# Patient Record
Sex: Female | Born: 1990 | Race: White | Hispanic: No | Marital: Married | Smoking: Current every day smoker
Health system: Southern US, Community
[De-identification: ages and names within clinical notes are randomized; demographics above are authoritative.]

## PROBLEM LIST (undated history)

## (undated) DIAGNOSIS — R102 Pelvic and perineal pain unspecified side: Secondary | ICD-10-CM

## (undated) DIAGNOSIS — F909 Attention-deficit hyperactivity disorder, unspecified type: Secondary | ICD-10-CM

## (undated) DIAGNOSIS — I10 Essential (primary) hypertension: Secondary | ICD-10-CM

## (undated) DIAGNOSIS — K219 Gastro-esophageal reflux disease without esophagitis: Secondary | ICD-10-CM

## (undated) DIAGNOSIS — F329 Major depressive disorder, single episode, unspecified: Secondary | ICD-10-CM

## (undated) DIAGNOSIS — G43009 Migraine without aura, not intractable, without status migrainosus: Secondary | ICD-10-CM

## (undated) DIAGNOSIS — K589 Irritable bowel syndrome without diarrhea: Secondary | ICD-10-CM

## (undated) DIAGNOSIS — M1990 Unspecified osteoarthritis, unspecified site: Secondary | ICD-10-CM

## (undated) DIAGNOSIS — D509 Iron deficiency anemia, unspecified: Secondary | ICD-10-CM

## (undated) DIAGNOSIS — F32A Depression, unspecified: Secondary | ICD-10-CM

## (undated) DIAGNOSIS — E7800 Pure hypercholesterolemia, unspecified: Secondary | ICD-10-CM

## (undated) DIAGNOSIS — N938 Other specified abnormal uterine and vaginal bleeding: Secondary | ICD-10-CM

## (undated) DIAGNOSIS — J45909 Unspecified asthma, uncomplicated: Secondary | ICD-10-CM

## (undated) DIAGNOSIS — R87612 Low grade squamous intraepithelial lesion on cytologic smear of cervix (LGSIL): Secondary | ICD-10-CM

## (undated) DIAGNOSIS — T7840XA Allergy, unspecified, initial encounter: Secondary | ICD-10-CM

## (undated) DIAGNOSIS — F419 Anxiety disorder, unspecified: Secondary | ICD-10-CM

## (undated) HISTORY — PX: WISDOM TOOTH EXTRACTION: SHX21

## (undated) HISTORY — DX: Other specified abnormal uterine and vaginal bleeding: N93.8

## (undated) HISTORY — DX: Pelvic and perineal pain: R10.2

## (undated) HISTORY — DX: Iron deficiency anemia, unspecified: D50.9

## (undated) HISTORY — DX: Allergy, unspecified, initial encounter: T78.40XA

## (undated) HISTORY — DX: Gastro-esophageal reflux disease without esophagitis: K21.9

## (undated) HISTORY — DX: Unspecified osteoarthritis, unspecified site: M19.90

## (undated) HISTORY — DX: Irritable bowel syndrome, unspecified: K58.9

## (undated) HISTORY — DX: Low grade squamous intraepithelial lesion on cytologic smear of cervix (LGSIL): R87.612

## (undated) HISTORY — DX: Pelvic and perineal pain unspecified side: R10.20

## (undated) HISTORY — DX: Migraine without aura, not intractable, without status migrainosus: G43.009

## (undated) HISTORY — PX: OTHER SURGICAL HISTORY: SHX169

---
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
## 2012-11-18 ENCOUNTER — Emergency Department: Payer: Self-pay | Admitting: Emergency Medicine

## 2012-11-19 LAB — CBC WITH DIFFERENTIAL/PLATELET
Basophil #: 0.1 10*3/uL (ref 0.0–0.1)
Basophil %: 0.6 %
Eosinophil #: 0.1 10*3/uL (ref 0.0–0.7)
HCT: 38.8 % (ref 35.0–47.0)
MCHC: 33.1 g/dL (ref 32.0–36.0)
MCV: 82 fL (ref 80–100)
Monocyte #: 0.7 x10 3/mm (ref 0.2–0.9)
Monocyte %: 7.2 %
Platelet: 277 10*3/uL (ref 150–440)
RDW: 20.5 % — ABNORMAL HIGH (ref 11.5–14.5)
WBC: 9.5 10*3/uL (ref 3.6–11.0)

## 2012-11-19 LAB — MONONUCLEOSIS SCREEN: Mono Test: NEGATIVE

## 2012-11-22 ENCOUNTER — Ambulatory Visit: Payer: Self-pay | Admitting: Physician Assistant

## 2013-03-08 ENCOUNTER — Ambulatory Visit: Payer: Self-pay | Admitting: Family Medicine

## 2013-03-08 LAB — RAPID STREP-A WITH REFLX: Micro Text Report: NEGATIVE

## 2013-03-10 LAB — BETA STREP CULTURE(ARMC)

## 2014-11-03 LAB — HM PAP SMEAR: HM Pap smear: NEGATIVE

## 2015-11-29 ENCOUNTER — Emergency Department
Admission: EM | Admit: 2015-11-29 | Discharge: 2015-11-29 | Disposition: A | Discharge status: Home / Self Care | Payer: BLUE CROSS/BLUE SHIELD | Attending: Emergency Medicine | Admitting: Emergency Medicine

## 2015-11-29 ENCOUNTER — Encounter: Payer: Self-pay | Admitting: *Deleted

## 2015-11-29 DIAGNOSIS — F909 Attention-deficit hyperactivity disorder, unspecified type: Secondary | ICD-10-CM | POA: Diagnosis not present

## 2015-11-29 DIAGNOSIS — Z79899 Other long term (current) drug therapy: Secondary | ICD-10-CM | POA: Diagnosis not present

## 2015-11-29 DIAGNOSIS — J45909 Unspecified asthma, uncomplicated: Secondary | ICD-10-CM | POA: Insufficient documentation

## 2015-11-29 DIAGNOSIS — F410 Panic disorder [episodic paroxysmal anxiety] without agoraphobia: Secondary | ICD-10-CM | POA: Diagnosis not present

## 2015-11-29 HISTORY — DX: Major depressive disorder, single episode, unspecified: F32.9

## 2015-11-29 HISTORY — DX: Attention-deficit hyperactivity disorder, unspecified type: F90.9

## 2015-11-29 HISTORY — DX: Depression, unspecified: F32.A

## 2015-11-29 HISTORY — DX: Unspecified asthma, uncomplicated: J45.909

## 2015-11-29 HISTORY — DX: Anxiety disorder, unspecified: F41.9

## 2015-11-29 MED ORDER — LORAZEPAM 2 MG/ML IJ SOLN
1.0000 mg | Freq: Once | INTRAMUSCULAR | Status: AC
  Administered 2015-11-29: 1 mg via INTRAMUSCULAR
  Filled 2015-11-29: qty 1

## 2015-11-29 NOTE — ED Provider Notes (Signed)
[facility]-EMERGENCY DEPARTMENT Provider Note   CSN: [id number] Arrival date & time: 11/29/15  1329     History   Chief Complaint Chief Complaint  Patient presents with  . Panic Attack    HPI Mariah Gonzalez is a 25 y.o. female hx of ADHD, anxiety, asthma here with anxiety, panic attacks. Patient states that she felt anxious for several weeks now. 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 Today for the last 2 hours she has been very anxious and states that she has been having panic attacks. She is on medicines for ADHD that hasn't been helping. Denies thoughts of harming herself or others or hallucinations.   The history is provided by the patient.    Past Medical History:  Diagnosis Date  . ADHD   . Anxiety   . Asthma   . Depressed     There are no active problems to display for this patient.   History reviewed. No pertinent surgical history.  OB History    No data available       Home Medications    Prior to Admission medications   Medication Sig Start Date End Date Taking? Authorizing Provider  citalopram (CELEXA) 20 MG tablet Take 20 mg by mouth daily.   Yes Historical Provider, MD  lamoTRIgine (LAMICTAL) 100 MG tablet Take 100 mg by mouth daily.   Yes Historical Provider, MD    Family History History reviewed. No pertinent family history.  Social History Social History  Substance Use Topics  . Smoking status: Never Smoker  . Smokeless tobacco: Never Used  . Alcohol use No     Allergies   Review of patient's allergies indicates no known allergies.   Review of Systems Review of Systems  Psychiatric/Behavioral: The patient is nervous/anxious.   All other systems reviewed and are negative.    Physical Exam Updated Vital Signs BP (!) 148/101 (BP Location: Left Arm)   Pulse (!) 106   Temp 98.5 F (36.9 C) (Oral)   Resp 18   Ht 5\' 2"  (1.575 m)   Wt 210 lb (95.3 kg)   LMP 11/15/2015    SpO2 100%   BMI 38.41 kg/m   Physical Exam  Constitutional: She is oriented to person, place, and time.  Anxious, hyperventilating   HENT:  Head: Normocephalic.  Eyes: EOM are normal. Pupils are equal, round, and reactive to light.  Neck: Normal range of motion. Neck supple.  Cardiovascular: Normal rate, regular rhythm and normal heart sounds.   Pulmonary/Chest: Effort normal and breath sounds normal. No respiratory distress. She has no wheezes. She has no rales.  Abdominal: Soft. Bowel sounds are normal. She exhibits no distension. There is no tenderness. There is no guarding.  Musculoskeletal: Normal range of motion.  Neurological: She is alert and oriented to person, place, and time.  Skin: Skin is warm.  Psychiatric: She has a normal mood and affect.  Nursing note and vitals reviewed.    ED Treatments / Results  Labs (all labs ordered are listed, but only abnormal results are displayed) Labs Reviewed - No data to display  EKG  EKG Interpretation None       Radiology No results found.  Procedures Procedures (including critical care time)  Medications Ordered in ED Medications  LORazepam (ATIVAN) injection 1 mg (1 mg Intramuscular Given 11/29/15 1524)     Initial Impression / Assessment and Plan / ED Course  I have reviewed the  triage vital signs and the nursing notes.  Pertinent labs & imaging results that were available during my care of the patient were reviewed by me and considered in my medical decision making (see chart for details).  Clinical Course     Mariah Gonzalez is a 25 y.o. female here with anxiety, panic attack. Denies suicidal or homicidal ideation. Will give ativan and reassess.   3:47 PM Felt better now. Not hyperventilating. Calm and slightly tired from ativan given. Will dc home. [facility] and can get medication adjustment there.    Final Clinical Impressions(s) / ED Diagnoses   Final diagnoses:  None    New  Prescriptions New Prescriptions   No medications on file     Charlynne Panderavid Hsienta Cobey Raineri, MD 11/29/15 1547

## 2015-11-29 NOTE — ED Triage Notes (Signed)
Pt arrives with complaints of a panic attack after she was being observed at school, states her feelings of anxiety have been building, pt takes celexa and lamictal, and an ADHD medication, pt trying to slow breathe in triage

## 2015-11-29 NOTE — Discharge Instructions (Signed)
Continue your current meds.   Rest for 2 days.   See [facility] at [facility].   Return to ER if you have panic attacks, chest pain, shortness of breath.

## 2017-08-12 ENCOUNTER — Encounter: Payer: Self-pay | Admitting: Obstetrics and Gynecology

## 2017-09-17 ENCOUNTER — Other Ambulatory Visit: Payer: Self-pay

## 2017-09-17 ENCOUNTER — Emergency Department: Payer: BC Managed Care – PPO

## 2017-09-17 ENCOUNTER — Encounter: Payer: Self-pay | Admitting: Emergency Medicine

## 2017-09-17 ENCOUNTER — Emergency Department
Admission: EM | Admit: 2017-09-17 | Discharge: 2017-09-18 | Disposition: A | Discharge status: Home / Self Care | Payer: BC Managed Care – PPO | Attending: Emergency Medicine | Admitting: Emergency Medicine

## 2017-09-17 DIAGNOSIS — J45909 Unspecified asthma, uncomplicated: Secondary | ICD-10-CM | POA: Diagnosis not present

## 2017-09-17 DIAGNOSIS — M79672 Pain in left foot: Secondary | ICD-10-CM | POA: Diagnosis present

## 2017-09-17 DIAGNOSIS — Z79899 Other long term (current) drug therapy: Secondary | ICD-10-CM | POA: Diagnosis not present

## 2017-09-17 MED ORDER — KETOROLAC TROMETHAMINE 30 MG/ML IJ SOLN
30.0000 mg | Freq: Once | INTRAMUSCULAR | Status: AC
  Administered 2017-09-18: 30 mg via INTRAMUSCULAR
  Filled 2017-09-17: qty 1

## 2017-09-17 NOTE — ED Triage Notes (Signed)
Pt to triage via w/c with no distress noted; pt c/o left foot pain this evening with no known injury

## 2017-09-18 MED ORDER — KETOROLAC TROMETHAMINE 10 MG PO TABS: 10.0000 mg | ORAL_TABLET | Freq: Four times a day (QID) | ORAL | 0 refills | Status: AC | PRN

## 2017-09-18 NOTE — ED Provider Notes (Signed)
[facility] Emergency Department Provider Note  ____________________________________________  Time seen: Approximately 12:18 AM  I have reviewed the triage vital signs and the nursing notes.   HISTORY  Chief Complaint Foot Pain    HPI Mariah Gonzalez is a 27 y.o. female presents to the emergency department with 10 out of 10 aching left foot pain.  Patient reports that she stood from a sitting position and walked to the sink to put dishes in the sink when she felt excruciating left foot pain.  Patient denies falls, traumas or penetrating foreign bodies.  She denies weakness, radiculopathy or changes in sensation in the upper or lower extremities.  Patient denies a history of left foot issues.  No alleviating measures have been attempted.   Past Medical History:  Diagnosis Date  . ADHD   . Anxiety   . Asthma   . Depressed     There are no active problems to display for this patient.   History reviewed. No pertinent surgical history.  Prior to Admission medications   Medication Sig Start Date End Date Taking? Authorizing Provider  citalopram (CELEXA) 20 MG tablet Take 20 mg by mouth daily.    [provider]  ketorolac (TORADOL) 10 MG tablet Take 1 tablet (10 mg total) by mouth every 6 (six) hours as needed for up to 5 days. 09/18/17 09/23/17  Orvil FeilWoods, Jaclyn M, PA-C  lamoTRIgine (LAMICTAL) 100 MG tablet Take 100 mg by mouth daily.    [provider]    Allergies Patient has no known allergies.  No family history on file.  Social History Social History   Tobacco Use  . Smoking status: Never Smoker  . Smokeless tobacco: Never Used  Substance Use Topics  . Alcohol use: No  . Drug use: Not on file     Review of Systems  Constitutional: No fever/chills Eyes: No visual changes. No discharge ENT: No upper respiratory complaints. Cardiovascular: no chest pain. Respiratory: no cough. No SOB. Gastrointestinal: No abdominal  pain.  No nausea, no vomiting.  No diarrhea.  No constipation. Musculoskeletal: Patient has left foot pain.  Skin: Negative for rash, abrasions, lacerations, ecchymosis. Neurological: Negative for headaches, focal weakness or numbness.   ____________________________________________   PHYSICAL EXAM:  VITAL SIGNS: ED Triage Vitals  Enc Vitals Group     BP 09/17/17 2126 136/73     Pulse Rate 09/17/17 2126 100     Resp 09/17/17 2126 18     Temp 09/17/17 2126 98.7 F (37.1 C)     Temp Source 09/17/17 2126 Oral     SpO2 09/17/17 2126 100 %     Weight 09/17/17 2125 196 lb (88.9 kg)     Height 09/17/17 2125 5\' 2"  (1.575 m)     Head Circumference --      Peak Flow --      Pain Score 09/17/17 2125 10     Pain Loc --      Pain Edu? --      Excl. in GC? --      Constitutional: Alert and oriented. Well appearing and in no acute distress. Eyes: Conjunctivae are normal. PERRL. EOMI. Head: Atraumatic.  Cardiovascular: Normal rate, regular rhythm. Normal S1 and S2.  Good peripheral circulation. Respiratory: Normal respiratory effort without tachypnea or retractions. Lungs CTAB. Good air entry to the bases with no decreased or absent breath sounds. Musculoskeletal: Full range of motion to all extremities. No gross deformities appreciated.  Patient has diffuse tenderness  to palpation over the dorsum of the left foot.  She is able to move all 5 left toes.  Palpable dorsalis pedis pulse, left. Neurologic:  Normal speech and language. No gross focal neurologic deficits are appreciated.  Skin:  Skin is warm, dry and intact. No rash noted. Psychiatric: Mood and affect are normal. Speech and behavior are normal. Patient exhibits appropriate insight and judgement.   ____________________________________________   LABS (all labs ordered are listed, but only abnormal results are displayed)  Labs Reviewed - No data to  display ____________________________________________  EKG   ____________________________________________  RADIOLOGY I personally viewed and evaluated these images as part of my medical decision making, as well as reviewing the written report by the radiologist  Dg Foot Complete Left  Result Date: 09/18/2017 CLINICAL DATA:  Acute onset of left foot pain.  Initial encounter. EXAM: LEFT FOOT - COMPLETE 3+ VIEW COMPARISON:  None. FINDINGS: There is no evidence of fracture or dislocation. The joint spaces are preserved. There is no evidence of talar subluxation; the subtalar joint is unremarkable in appearance. Mild soft tissue edema is noted about the forefoot. IMPRESSION: No evidence of fracture or dislocation. Electronically Signed   By: Roanna Raider M.D.   On: 09/18/2017 00:11    ____________________________________________    PROCEDURES  Procedure(s) performed:    Procedures    Medications  ketorolac (TORADOL) 30 MG/ML injection 30 mg (30 mg Intramuscular Given 09/18/17 0012)     ____________________________________________   INITIAL IMPRESSION / ASSESSMENT AND PLAN / ED COURSE  Pertinent labs & imaging results that were available during my care of the patient were reviewed by me and considered in my medical decision making (see chart for details).  Review of the [place] CSRS was performed in accordance of the [facility] prior to dispensing any controlled drugs.      Assessment and plan Left foot pain Patient presents to the emergency department with left foot pain that started idiopathically tonight in the absence of falls or traumas.  X-ray examination revealed no acute abnormality.  Patient was given Toradol in the emergency department and discharged with Toradol.  She was referred to podiatry.  All patient questions were answered.    ____________________________________________  FINAL CLINICAL IMPRESSION(S) / ED DIAGNOSES  Final diagnoses:  Left foot pain      NEW  MEDICATIONS STARTED DURING THIS VISIT:  ED Discharge Orders         Ordered    ketorolac (TORADOL) 10 MG tablet  Every 6 hours PRN     09/18/17 0004              This chart was dictated using voice recognition software/Dragon. Despite best efforts to proofread, errors can occur which can change the meaning. Any change was purely unintentional.    Orvil Feil, PA-C 09/18/17 Earl Lagos, MD 09/18/17 [phone]

## 2018-12-02 ENCOUNTER — Other Ambulatory Visit: Payer: Self-pay

## 2018-12-02 DIAGNOSIS — Z20822 Contact with and (suspected) exposure to covid-19: Secondary | ICD-10-CM

## 2018-12-04 LAB — NOVEL CORONAVIRUS, NAA: SARS-CoV-2, NAA: NOT DETECTED

## 2019-01-12 ENCOUNTER — Other Ambulatory Visit: Payer: Self-pay

## 2019-01-12 DIAGNOSIS — Z20822 Contact with and (suspected) exposure to covid-19: Secondary | ICD-10-CM

## 2019-01-14 LAB — NOVEL CORONAVIRUS, NAA: SARS-CoV-2, NAA: NOT DETECTED

## 2019-08-02 ENCOUNTER — Other Ambulatory Visit: Payer: Self-pay

## 2019-08-02 ENCOUNTER — Ambulatory Visit: Payer: BC Managed Care – PPO | Admitting: Family Medicine

## 2019-08-02 ENCOUNTER — Encounter: Payer: Self-pay | Admitting: Family Medicine

## 2019-08-02 VITALS — BP 134/96 | HR 102 | Temp 98.6°F | Ht 61.0 in | Wt 196.0 lb

## 2019-08-02 DIAGNOSIS — Z113 Encounter for screening for infections with a predominantly sexual mode of transmission: Secondary | ICD-10-CM

## 2019-08-02 DIAGNOSIS — R030 Elevated blood-pressure reading, without diagnosis of hypertension: Secondary | ICD-10-CM

## 2019-08-02 DIAGNOSIS — Z114 Encounter for screening for human immunodeficiency virus [HIV]: Secondary | ICD-10-CM

## 2019-08-02 DIAGNOSIS — Z862 Personal history of diseases of the blood and blood-forming organs and certain disorders involving the immune mechanism: Secondary | ICD-10-CM

## 2019-08-02 DIAGNOSIS — F339 Major depressive disorder, recurrent, unspecified: Secondary | ICD-10-CM

## 2019-08-02 DIAGNOSIS — Z79899 Other long term (current) drug therapy: Secondary | ICD-10-CM

## 2019-08-02 DIAGNOSIS — F319 Bipolar disorder, unspecified: Secondary | ICD-10-CM

## 2019-08-02 DIAGNOSIS — Z1159 Encounter for screening for other viral diseases: Secondary | ICD-10-CM

## 2019-08-02 DIAGNOSIS — Z13220 Encounter for screening for lipoid disorders: Secondary | ICD-10-CM

## 2019-08-02 DIAGNOSIS — F419 Anxiety disorder, unspecified: Secondary | ICD-10-CM

## 2019-08-02 MED ORDER — LAMOTRIGINE 100 MG PO TABS: 100.0000 mg | ORAL_TABLET | Freq: Every day | ORAL | 1 refills | Status: DC

## 2019-08-02 NOTE — Assessment & Plan Note (Signed)
Unclear if she has a diagnosis of bipolar or depression. Continue lamictal, checking levels. Will refer to psychiatry.

## 2019-08-02 NOTE — Assessment & Plan Note (Signed)
Rechecking labs today. Await results. Treat as needed.  °

## 2019-08-02 NOTE — Progress Notes (Signed)
BP (!) 134/96 (BP Location: Left Arm, Cuff Size: Large)   Pulse (!) 102   Temp 98.6 F (37 C) (Oral)   Ht 5\' 1"  (1.549 m)   Wt 196 lb (88.9 kg)   SpO2 99%   BMI 37.03 kg/m    Subjective:    Patient ID: , female    DOB: September 21, 1990, 29 y.o.   MRN: [id number]  HPI: Mariah Gonzalez is a 29 y.o. female who presents today with her fiance to establish care.   Chief Complaint  Patient presents with  . Establish Care  . Referral    Psych   DEPRESSION- notes that she would like to get in with a new psych doctor. She was seeing Dr. [id number] at [facility], but didn't get along with him. Would like a psychiatrist who is LGBT+ affirming and who is comfortable with bipolar.  Mood status: controlled Satisfied with current treatment?: yes Symptom severity: mild  Duration of current treatment : chronic Side effects: no Medication compliance: excellent compliance Psychotherapy/counseling: yes in the past Depressed mood: yes Anxious mood: yes Anhedonia: no Significant weight loss or gain: no Insomnia: yes hard to fall asleep Fatigue: yes Feelings of worthlessness or guilt: yes Impaired concentration/indecisiveness: no Suicidal ideations: no Hopelessness: no Crying spells: no Depression screen PHQ 2/9 08/02/2019  Decreased Interest 1  Down, Depressed, Hopeless 1  PHQ - 2 Score 2  Altered sleeping 2  Tired, decreased energy 2  Change in appetite 1  Feeling bad or failure about yourself  1  Trouble concentrating 1  Moving slowly or fidgety/restless 2  Suicidal thoughts 0  PHQ-9 Score 11  Difficult doing work/chores Somewhat difficult     Active Ambulatory Problems    Diagnosis Date Noted  . Depression, recurrent (HCC) 08/02/2019  . History of anemia 08/02/2019   Resolved Ambulatory Problems    Diagnosis Date Noted  . No Resolved Ambulatory Problems   Past Medical History:  Diagnosis Date  . ADHD   . Anxiety   . Asthma   . Depressed    History  reviewed. No pertinent surgical history.  Outpatient Encounter Medications as of 08/02/2019  Medication Sig  . cetirizine (ZYRTEC ALLERGY) 10 MG tablet Take 10 mg by mouth daily.  08/04/2019 lamoTRIgine (LAMICTAL) 100 MG tablet Take 1 tablet (100 mg total) by mouth daily.  Marland Kitchen lidocaine (XYLOCAINE) 2 % jelly Apply topically.  Marland Kitchen omeprazole (PRILOSEC) 20 MG capsule Take 20 mg by mouth daily.  . [DISCONTINUED] lamoTRIgine (LAMICTAL) 100 MG tablet Take 100 mg by mouth daily.  . [DISCONTINUED] citalopram (CELEXA) 20 MG tablet Take 20 mg by mouth daily.   No facility-administered encounter medications on file as of 08/02/2019.   No Known Allergies  Social History   Socioeconomic History  . Marital status: Single    Spouse name: Not on file  . Number of children: Not on file  . Years of education: Not on file  . Highest education level: Not on file  Occupational History  . Not on file  Tobacco Use  . Smoking status: Current Every Day Smoker    Packs/day: 0.25    Types: Cigarettes  . Smokeless tobacco: Never Used  Substance and Sexual Activity  . Alcohol use: No  . Drug use: Never  . Sexual activity: Yes  Other Topics Concern  . Not on file  Social History Narrative  . Not on file   Social Determinants of Health   Financial Resource Strain:   .  Difficulty of Paying Living Expenses:   Food Insecurity:   . Worried About Programme researcher, broadcasting/film/video in the Last Year:   . Barista in the Last Year:   Transportation Needs:   . Freight forwarder (Medical):   Marland Kitchen Lack of Transportation (Non-Medical):   Physical Activity:   . Days of Exercise per Week:   . Minutes of Exercise per Session:   Stress:   . Feeling of Stress :   Social Connections:   . Frequency of Communication with Friends and Family:   . Frequency of Social Gatherings with Friends and Family:   . Attends Religious Services:   . Active Member of Clubs or Organizations:   . Attends Banker Meetings:   Marland Kitchen  Marital Status:    Family History  Problem Relation Age of Onset  . Hyperlipidemia Mother   . Hypertension Mother   . Hyperlipidemia Father   . Hypertension Father   . Cancer Maternal Aunt   . Cancer Maternal Uncle   . Cancer Paternal Aunt   . Hearing loss Paternal Aunt   . Cancer Paternal Uncle   . Hearing loss Paternal Uncle     Review of Systems  Constitutional: Negative.   Respiratory: Negative.   Cardiovascular: Negative.   Gastrointestinal: Negative.   Musculoskeletal: Negative.   Skin: Negative.   Psychiatric/Behavioral: Positive for dysphoric mood. Negative for agitation, behavioral problems, confusion, decreased concentration, hallucinations, self-injury, sleep disturbance and suicidal ideas. The patient is nervous/anxious. The patient is not hyperactive.     Per HPI unless specifically indicated above     Objective:    BP (!) 134/96 (BP Location: Left Arm, Cuff Size: Large)   Pulse (!) 102   Temp 98.6 F (37 C) (Oral)   Ht 5\' 1"  (1.549 m)   Wt 196 lb (88.9 kg)   SpO2 99%   BMI 37.03 kg/m   Wt Readings from Last 3 Encounters:  08/02/19 196 lb (88.9 kg)  09/17/17 196 lb (88.9 kg)  11/29/15 210 lb (95.3 kg)    Physical Exam Vitals and nursing note reviewed.  Constitutional:      General: She is not in acute distress.    Appearance: Normal appearance. She is not ill-appearing, toxic-appearing or diaphoretic.  HENT:     Head: Normocephalic and atraumatic.     Right Ear: Tympanic membrane, ear canal and external ear normal.     Left Ear: Tympanic membrane, ear canal and external ear normal.     Nose: Nose normal. No congestion or rhinorrhea.     Mouth/Throat:     Mouth: Mucous membranes are moist.     Pharynx: Oropharynx is clear.     Comments: 2+ tonsils Eyes:     General: No scleral icterus.       Right eye: No discharge.        Left eye: No discharge.     Extraocular Movements: Extraocular movements intact.     Conjunctiva/sclera: Conjunctivae  normal.     Pupils: Pupils are equal, round, and reactive to light.  Cardiovascular:     Rate and Rhythm: Normal rate and regular rhythm.     Pulses: Normal pulses.     Heart sounds: Normal heart sounds. No murmur heard.  No friction rub. No gallop.   Pulmonary:     Effort: Pulmonary effort is normal. No respiratory distress.     Breath sounds: Normal breath sounds. No stridor. No wheezing, rhonchi or rales.  Chest:     Chest wall: No tenderness.  Musculoskeletal:        General: Normal range of motion.     Cervical back: Normal range of motion and neck supple.  Skin:    General: Skin is warm and dry.     Capillary Refill: Capillary refill takes less than 2 seconds.     Coloration: Skin is not jaundiced or pale.     Findings: No bruising, erythema, lesion or rash.  Neurological:     General: No focal deficit present.     Mental Status: She is alert and oriented to person, place, and time. Mental status is at baseline.  Psychiatric:        Mood and Affect: Mood normal.        Behavior: Behavior normal.        Thought Content: Thought content normal.        Judgment: Judgment normal.     Results for orders placed or performed in visit on 08/02/19  HM PAP SMEAR  Result Value Ref Range   HM Pap smear ASCUS HPV neg       Assessment & Plan:   Problem List Items Addressed This Visit      Other   Depression, recurrent ([place])    Unclear if she has a diagnosis of bipolar or depression. Continue lamictal, checking levels. Will refer to psychiatry.      Relevant Orders   Ambulatory referral to Psychiatry   History of anemia    Rechecking labs today. Await results. Treat as needed.       Relevant Orders   Iron and TIBC   Ferritin    Other Visit Diagnoses    Screening for HIV (human immunodeficiency virus)    -  Primary   Labs drawn today. Await results.    Relevant Orders   HIV Antibody (routine testing w rflx)   HSV(herpes simplex vrs) 1+2 ab-IgG   Hepatitis, Acute    RPR   Elevated blood-pressure reading without diagnosis of hypertension       Better on recheck. Work on diet and exercise. Will check labs. Await results.    Relevant Orders   CBC with Differential/Platelet   Comprehensive metabolic panel   TSH   Urinalysis, Routine w reflex microscopic   Microalbumin, Urine Waived   Bipolar 1 disorder (HCC)       Unclear if she has a diagnosis of bipolar or depression. Continue lamictal, checking levels. Will refer to psychiatry.   Relevant Orders   Ambulatory referral to Psychiatry   Anxiety       Unclear if she has a diagnosis of bipolar or depression. Continue lamictal, checking levels. Will refer to psychiatry.   Relevant Orders   Ambulatory referral to Psychiatry   Need for hepatitis C screening test       Labs drawn today. Await results.    Relevant Orders   Hepatitis C antibody   Screening for cholesterol level       Labs drawn today. Await results.    Relevant Orders   Lipid Panel w/o Chol/HDL Ratio   Encounter for long-term current use of medication       Labs drawn today. Await results.    Relevant Orders   Lamotrigine level   Routine screening for STI (sexually transmitted infection)       Labs drawn today. Await results.    Relevant Orders   GC/Chlamydia Probe Amp       Follow  up plan: Return in about 6 months (around 02/01/2020) for physical.

## 2019-08-03 ENCOUNTER — Encounter: Payer: Self-pay | Admitting: Family Medicine

## 2019-08-03 LAB — CBC WITH DIFFERENTIAL/PLATELET
Basophils Absolute: 0 10*3/uL (ref 0.0–0.2)
Basos: 0 %
EOS (ABSOLUTE): 0 10*3/uL (ref 0.0–0.4)
Eos: 0 %
Hematocrit: 43.5 % (ref 34.0–46.6)
Hemoglobin: 14.5 g/dL (ref 11.1–15.9)
Immature Grans (Abs): 0 10*3/uL (ref 0.0–0.1)
Immature Granulocytes: 0 %
Lymphocytes Absolute: 3 10*3/uL (ref 0.7–3.1)
Lymphs: 37 %
MCH: 27.7 pg (ref 26.6–33.0)
MCHC: 33.3 g/dL (ref 31.5–35.7)
MCV: 83 fL (ref 79–97)
Monocytes Absolute: 0.5 10*3/uL (ref 0.1–0.9)
Monocytes: 7 %
Neutrophils Absolute: 4.5 10*3/uL (ref 1.4–7.0)
Neutrophils: 56 %
Platelets: 305 10*3/uL (ref 150–450)
RBC: 5.23 x10E6/uL (ref 3.77–5.28)
RDW: 14.8 % (ref 11.7–15.4)
WBC: 8 10*3/uL (ref 3.4–10.8)

## 2019-08-03 LAB — TSH: TSH: 1.18 u[IU]/mL (ref 0.450–4.500)

## 2019-08-03 LAB — COMPREHENSIVE METABOLIC PANEL
ALT: 17 IU/L (ref 0–32)
AST: 20 IU/L (ref 0–40)
Albumin/Globulin Ratio: 1.7 (ref 1.2–2.2)
Albumin: 4.7 g/dL (ref 3.9–5.0)
Alkaline Phosphatase: 91 IU/L (ref 48–121)
BUN/Creatinine Ratio: 16 (ref 9–23)
BUN: 15 mg/dL (ref 6–20)
Bilirubin Total: <0.2 mg/dL (ref 0.0–1.2)
CO2: 22 mmol/L (ref 20–29)
Calcium: 9.6 mg/dL (ref 8.7–10.2)
Chloride: 101 mmol/L (ref 96–106)
Creatinine, Ser: 0.96 mg/dL (ref 0.57–1.00)
GFR calc Af Amer: 94 mL/min/{1.73_m2} (ref >59)
GFR calc non Af Amer: 81 mL/min/{1.73_m2} (ref >59)
Globulin, Total: 2.8 g/dL (ref 1.5–4.5)
Glucose: 96 mg/dL (ref 65–99)
Potassium: 4.2 mmol/L (ref 3.5–5.2)
Sodium: 137 mmol/L (ref 134–144)
Total Protein: 7.5 g/dL (ref 6.0–8.5)

## 2019-08-03 LAB — URINALYSIS, ROUTINE W REFLEX MICROSCOPIC
Bilirubin, UA: NEGATIVE
Glucose, UA: NEGATIVE
Ketones, UA: NEGATIVE
Leukocytes,UA: NEGATIVE
Nitrite, UA: NEGATIVE
Protein,UA: NEGATIVE
RBC, UA: NEGATIVE
Specific Gravity, UA: 1.02 (ref 1.005–1.030)
Urobilinogen, Ur: 0.2 mg/dL (ref 0.2–1.0)
pH, UA: 5 (ref 5.0–7.5)

## 2019-08-03 LAB — HSV(HERPES SIMPLEX VRS) I + II AB-IGG
HSV 1 Glycoprotein G Ab, IgG: 23.4 index — ABNORMAL HIGH (ref 0.00–0.90)
HSV 2 IgG, Type Spec: <0.91 index (ref 0.00–0.90)

## 2019-08-03 LAB — RPR: RPR Ser Ql: NONREACTIVE

## 2019-08-03 LAB — LIPID PANEL W/O CHOL/HDL RATIO
Cholesterol, Total: 241 mg/dL — ABNORMAL HIGH (ref 100–199)
HDL: 42 mg/dL (ref >39)
LDL Chol Calc (NIH): 147 mg/dL — ABNORMAL HIGH (ref 0–99)
Triglycerides: 286 mg/dL — ABNORMAL HIGH (ref 0–149)
VLDL Cholesterol Cal: 52 mg/dL — ABNORMAL HIGH (ref 5–40)

## 2019-08-03 LAB — GC/CHLAMYDIA PROBE AMP
Chlamydia trachomatis, NAA: NEGATIVE
Neisseria Gonorrhoeae by PCR: NEGATIVE

## 2019-08-03 LAB — LAMOTRIGINE LEVEL: Lamotrigine Lvl: <1 ug/mL — ABNORMAL LOW (ref 2.0–20.0)

## 2019-08-03 LAB — IRON AND TIBC
Iron Saturation: 12 % — ABNORMAL LOW (ref 15–55)
Iron: 41 ug/dL (ref 27–159)
Total Iron Binding Capacity: 330 ug/dL (ref 250–450)
UIBC: 289 ug/dL (ref 131–425)

## 2019-08-03 LAB — FERRITIN: Ferritin: 16 ng/mL (ref 15–150)

## 2019-08-03 LAB — MICROALBUMIN, URINE WAIVED
Creatinine, Urine Waived: 100 mg/dL (ref 10–300)
Microalb, Ur Waived: 10 mg/L (ref 0–19)
Microalb/Creat Ratio: <30 mg/g (ref <30)

## 2019-08-03 LAB — HEPATITIS PANEL, ACUTE
Hep A IgM: NEGATIVE
Hep B C IgM: NEGATIVE
Hep C Virus Ab: <0.1 s/co ratio (ref 0.0–0.9)
Hepatitis B Surface Ag: NEGATIVE

## 2019-08-03 LAB — HIV ANTIBODY (ROUTINE TESTING W REFLEX): HIV Screen 4th Generation wRfx: NONREACTIVE

## 2019-08-05 ENCOUNTER — Encounter: Payer: Self-pay | Admitting: Family Medicine

## 2019-09-21 ENCOUNTER — Encounter: Payer: Self-pay | Admitting: Family Medicine

## 2019-09-22 LAB — HM PAP SMEAR

## 2019-10-11 ENCOUNTER — Encounter: Payer: Self-pay | Admitting: Psychiatry

## 2019-10-11 ENCOUNTER — Other Ambulatory Visit: Payer: Self-pay

## 2019-10-11 ENCOUNTER — Telehealth (INDEPENDENT_AMBULATORY_CARE_PROVIDER_SITE_OTHER): Payer: BC Managed Care – PPO | Admitting: Psychiatry

## 2019-10-11 DIAGNOSIS — F4010 Social phobia, unspecified: Secondary | ICD-10-CM | POA: Insufficient documentation

## 2019-10-11 DIAGNOSIS — J45909 Unspecified asthma, uncomplicated: Secondary | ICD-10-CM | POA: Insufficient documentation

## 2019-10-11 DIAGNOSIS — T7840XA Allergy, unspecified, initial encounter: Secondary | ICD-10-CM | POA: Insufficient documentation

## 2019-10-11 DIAGNOSIS — N938 Other specified abnormal uterine and vaginal bleeding: Secondary | ICD-10-CM | POA: Insufficient documentation

## 2019-10-11 DIAGNOSIS — R102 Pelvic and perineal pain: Secondary | ICD-10-CM | POA: Insufficient documentation

## 2019-10-11 DIAGNOSIS — F17210 Nicotine dependence, cigarettes, uncomplicated: Secondary | ICD-10-CM | POA: Insufficient documentation

## 2019-10-11 DIAGNOSIS — F3162 Bipolar disorder, current episode mixed, moderate: Secondary | ICD-10-CM | POA: Insufficient documentation

## 2019-10-11 DIAGNOSIS — F3131 Bipolar disorder, current episode depressed, mild: Secondary | ICD-10-CM | POA: Diagnosis not present

## 2019-10-11 DIAGNOSIS — K219 Gastro-esophageal reflux disease without esophagitis: Secondary | ICD-10-CM | POA: Insufficient documentation

## 2019-10-11 DIAGNOSIS — F4310 Post-traumatic stress disorder, unspecified: Secondary | ICD-10-CM | POA: Diagnosis not present

## 2019-10-11 DIAGNOSIS — Z8659 Personal history of other mental and behavioral disorders: Secondary | ICD-10-CM | POA: Insufficient documentation

## 2019-10-11 DIAGNOSIS — F17200 Nicotine dependence, unspecified, uncomplicated: Secondary | ICD-10-CM

## 2019-10-11 MED ORDER — HYDROXYZINE HCL 25 MG PO TABS: 25.0000 mg | ORAL_TABLET | Freq: Every evening | ORAL | 1 refills | Status: DC | PRN

## 2019-10-11 MED ORDER — VENLAFAXINE HCL ER 37.5 MG PO CP24: 37.5000 mg | ORAL_CAPSULE | Freq: Every day | ORAL | 1 refills | Status: DC

## 2019-10-11 MED ORDER — LAMOTRIGINE 25 MG PO TABS: 25.0000 mg | ORAL_TABLET | Freq: Every day | ORAL | 1 refills | Status: DC

## 2019-10-11 MED ORDER — LAMOTRIGINE 200 MG PO TABS: 200.0000 mg | ORAL_TABLET | Freq: Every day | ORAL | 0 refills | Status: DC
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
## 2019-10-11 NOTE — Patient Instructions (Signed)
Hydroxyzine capsules or tablets What is this medicine? HYDROXYZINE (hye DROX i zeen) is an antihistamine. This medicine is used to treat allergy symptoms. It is also used to treat anxiety and tension. This medicine can be used with other medicines to induce sleep before surgery. This medicine may be used for other purposes; ask your health care provider or pharmacist if you have questions. COMMON BRAND NAME(S): ANX, Atarax, Rezine, Vistaril What should I tell my health care provider before I take this medicine? They need to know if you have any of these conditions:  glaucoma  heart disease  history of irregular heartbeat  kidney disease  liver disease  lung or breathing disease, like asthma  stomach or intestine problems  thyroid disease  trouble passing urine  an unusual or allergic reaction to hydroxyzine, cetirizine, other medicines, foods, dyes or preservatives  pregnant or trying to get pregnant  breast-feeding How should I use this medicine? Take this medicine by mouth with a full glass of water. Follow the directions on the prescription label. You may take this medicine with food or on an empty stomach. Take your medicine at regular intervals. Do not take your medicine more often than directed. Talk to your pediatrician regarding the use of this medicine in children. Special care may be needed. While this drug may be prescribed for children as young as 6 years of age for selected conditions, precautions do apply. Patients over 65 years old may have a stronger reaction and need a smaller dose. Overdosage: If you think you have taken too much of this medicine contact a poison control center or emergency room at once. NOTE: This medicine is only for you. Do not share this medicine with others. What if I miss a dose? If you miss a dose, take it as soon as you can. If it is almost time for your next dose, take only that dose. Do not take double or extra doses. What may  interact with this medicine? Do not take this medicine with any of the following medications:  cisapride  dronedarone  pimozide  thioridazine This medicine may also interact with the following medications:  alcohol  antihistamines for allergy, cough, and cold  atropine  barbiturate medicines for sleep or seizures, like phenobarbital  certain antibiotics like erythromycin or clarithromycin  certain medicines for anxiety or sleep  certain medicines for bladder problems like oxybutynin, tolterodine  certain medicines for depression or psychotic disturbances  certain medicines for irregular heart beat  certain medicines for Parkinson's disease like benztropine, trihexyphenidyl  certain medicines for seizures like phenobarbital, primidone  certain medicines for stomach problems like dicyclomine, hyoscyamine  certain medicines for travel sickness like scopolamine  ipratropium  narcotic medicines for pain  other medicines that prolong the QT interval (an abnormal heart rhythm) like dofetilide This list may not describe all possible interactions. Give your health care provider a list of all the medicines, herbs, non-prescription drugs, or dietary supplements you use. Also tell them if you smoke, drink alcohol, or use illegal drugs. Some items may interact with your medicine. What should I watch for while using this medicine? Tell your doctor or health care professional if your symptoms do not improve. You may get drowsy or dizzy. Do not drive, use machinery, or do anything that needs mental alertness until you know how this medicine affects you. Do not stand or sit up quickly, especially if you are an older patient. This reduces the risk of dizzy or fainting spells. Alcohol may   interfere with the effect of this medicine. Avoid alcoholic drinks. Your mouth may get dry. Chewing sugarless gum or sucking hard candy, and drinking plenty of water may help. Contact your doctor if the  problem does not go away or is severe. This medicine may cause dry eyes and blurred vision. If you wear contact lenses you may feel some discomfort. Lubricating drops may help. See your eye doctor if the problem does not go away or is severe. If you are receiving skin tests for allergies, tell your doctor you are using this medicine. What side effects may I notice from receiving this medicine? Side effects that you should report to your doctor or health care professional as soon as possible:  allergic reactions like skin rash, itching or hives, swelling of the face, lips, or tongue  changes in vision  confusion  fast, irregular heartbeat  seizures  tremor  trouble passing urine or change in the amount of urine Side effects that usually do not require medical attention (report to your doctor or health care professional if they continue or are bothersome):  constipation  drowsiness  dry mouth  headache  tiredness This list may not describe all possible side effects. Call your doctor for medical advice about side effects. You may report side effects to FDA at 1-800-FDA-1088. Where should I keep my medicine? Keep out of the reach of children. Store at room temperature between 15 and 30 degrees C (59 and 86 degrees F). Keep container tightly closed. Throw away any unused medicine after the expiration date. NOTE: This sheet is a summary. It may not cover all possible information. If you have questions about this medicine, talk to your doctor, pharmacist, or health care provider.  2020 Elsevier/Gold Standard (2018-01-18 13:19:55) Venlafaxine extended-release capsules What is this medicine? VENLAFAXINE(VEN la fax een) is used to treat depression, anxiety and panic disorder. This medicine may be used for other purposes; ask your health care provider or pharmacist if you have questions. COMMON BRAND NAME(S): Effexor XR What should I tell my health care provider before I take this  medicine? They need to know if you have any of these conditions:  bleeding disorders  glaucoma  heart disease  high blood pressure  high cholesterol  kidney disease  liver disease  low levels of sodium in the blood  mania or bipolar disorder  seizures  suicidal thoughts, plans, or attempt; a previous suicide attempt by you or a family  take medicines that treat or prevent blood clots  thyroid disease  an unusual or allergic reaction to venlafaxine, desvenlafaxine, other medicines, foods, dyes, or preservatives  pregnant or trying to get pregnant  breast-feeding How should I use this medicine? Take this medicine by mouth with a full glass of water. Follow the directions on the prescription label. Do not cut, crush, or chew this medicine. Take it with food. If needed, the capsule may be carefully opened and the entire contents sprinkled on a spoonful of cool applesauce. Swallow the applesauce/pellet mixture right away without chewing and follow with a glass of water to ensure complete swallowing of the pellets. Try to take your medicine at about the same time each day. Do not take your medicine more often than directed. Do not stop taking this medicine suddenly except upon the advice of your doctor. Stopping this medicine too quickly may cause serious side effects or your condition may worsen. A special MedGuide will be given to you by the pharmacist with each prescription and refill.   Be sure to read this information carefully each time. Talk to your pediatrician regarding the use of this medicine in children. Special care may be needed. Overdosage: If you think you have taken too much of this medicine contact a poison control center or emergency room at once. NOTE: This medicine is only for you. Do not share this medicine with others. What if I miss a dose? If you miss a dose, take it as soon as you can. If it is almost time for your next dose, take only that dose. Do not take  double or extra doses. What may interact with this medicine? Do not take this medicine with any of the following medications:  certain medicines for fungal infections like fluconazole, itraconazole, ketoconazole, posaconazole, voriconazole  cisapride  desvenlafaxine  dronedarone  duloxetine  levomilnacipran  linezolid  MAOIs like Carbex, Eldepryl, Marplan, Nardil, and Parnate  methylene blue (injected into a vein)  milnacipran  pimozide  thioridazine This medicine may also interact with the following medications:  amphetamines  aspirin and aspirin-like medicines  certain medicines for depression, anxiety, or psychotic disturbances  certain medicines for migraine headaches like almotriptan, eletriptan, frovatriptan, naratriptan, rizatriptan, sumatriptan, zolmitriptan  certain medicines for sleep  certain medicines that treat or prevent blood clots like dalteparin, enoxaparin, warfarin  cimetidine  clozapine  diuretics  fentanyl  furazolidone  indinavir  isoniazid  lithium  metoprolol  NSAIDS, medicines for pain and inflammation, like ibuprofen or naproxen  other medicines that prolong the QT interval (cause an abnormal heart rhythm) like dofetilide, ziprasidone  procarbazine  rasagiline  supplements like St. John's wort, kava kava, valerian  tramadol  tryptophan This list may not describe all possible interactions. Give your health care provider a list of all the medicines, herbs, non-prescription drugs, or dietary supplements you use. Also tell them if you smoke, drink alcohol, or use illegal drugs. Some items may interact with your medicine. What should I watch for while using this medicine? Tell your doctor if your symptoms do not get better or if they get worse. Visit your doctor or health care professional for regular checks on your progress. Because it may take several weeks to see the full effects of this medicine, it is important to  continue your treatment as prescribed by your doctor. Patients and their families should watch out for new or worsening thoughts of suicide or depression. Also watch out for sudden changes in feelings such as feeling anxious, agitated, panicky, irritable, hostile, aggressive, impulsive, severely restless, overly excited and hyperactive, or not being able to sleep. If this happens, especially at the beginning of treatment or after a change in dose, call your health care professional. This medicine can cause an increase in blood pressure. Check with your doctor for instructions on monitoring your blood pressure while taking this medicine. You may get drowsy or dizzy. Do not drive, use machinery, or do anything that needs mental alertness until you know how this medicine affects you. Do not stand or sit up quickly, especially if you are an older patient. This reduces the risk of dizzy or fainting spells. Alcohol may interfere with the effect of this medicine. Avoid alcoholic drinks. Your mouth may get dry. Chewing sugarless gum, sucking hard candy and drinking plenty of water will help. Contact your doctor if the problem does not go away or is severe. What side effects may I notice from receiving this medicine? Side effects that you should report to your doctor or health care professional as soon   as possible:  allergic reactions like skin rash, itching or hives, swelling of the face, lips, or tongue  anxious  breathing problems  confusion  changes in vision  chest pain  confusion  elevated mood, decreased need for sleep, racing thoughts, impulsive behavior  eye pain  fast, irregular heartbeat  feeling faint or lightheaded, falls  feeling agitated, angry, or irritable  hallucination, loss of contact with reality  high blood pressure  loss of balance or coordination  palpitations  redness, blistering, peeling or loosening of the skin, including inside the mouth  restlessness,  pacing, inability to keep still  seizures  stiff muscles  suicidal thoughts or other mood changes  trouble passing urine or change in the amount of urine  trouble sleeping  unusual bleeding or bruising  unusually weak or tired  vomiting Side effects that usually do not require medical attention (report to your doctor or health care professional if they continue or are bothersome):  change in sex drive or performance  change in appetite or weight  constipation  dizziness  dry mouth  headache  increased sweating  nausea  tired This list may not describe all possible side effects. Call your doctor for medical advice about side effects. You may report side effects to FDA at 1-800-FDA-1088. Where should I keep my medicine? Keep out of the reach of children. Store at a controlled temperature between 20 and 25 degrees C (68 degrees and 77 degrees F), in a dry place. Throw away any unused medicine after the expiration date. NOTE: This sheet is a summary. It may not cover all possible information. If you have questions about this medicine, talk to your doctor, pharmacist, or health care provider.  2020 Elsevier/Gold Standard (2018-01-19 12:06:43)  

## 2019-10-13 ENCOUNTER — Encounter: Payer: Self-pay | Admitting: Family Medicine

## 2019-10-13 ENCOUNTER — Other Ambulatory Visit: Payer: Self-pay

## 2019-10-13 ENCOUNTER — Ambulatory Visit: Payer: BC Managed Care – PPO | Admitting: Family Medicine

## 2019-10-13 VITALS — BP 132/88 | HR 89 | Temp 98.6°F | Wt 205.0 lb

## 2019-10-13 DIAGNOSIS — M545 Low back pain: Secondary | ICD-10-CM | POA: Diagnosis not present

## 2019-10-13 DIAGNOSIS — G8929 Other chronic pain: Secondary | ICD-10-CM | POA: Diagnosis not present

## 2019-10-13 DIAGNOSIS — F5101 Primary insomnia: Secondary | ICD-10-CM

## 2019-10-13 DIAGNOSIS — R0683 Snoring: Secondary | ICD-10-CM

## 2019-10-13 MED ORDER — CYCLOBENZAPRINE HCL 10 MG PO TABS: 10.0000 mg | ORAL_TABLET | Freq: Every day | ORAL | 2 refills | Status: DC

## 2019-10-13 NOTE — Progress Notes (Signed)
BP 132/88 (BP Location: Left Arm, Cuff Size: Large)   Pulse 89   Temp 98.6 F (37 C) (Oral)   Wt 205 lb (93 kg)   SpO2 98%   BMI 38.73 kg/m    Subjective:    Patient ID: Mariah Gonzalez, female    DOB: 04-21-1990, 29 y.o.   MRN: [id number]  HPI: Mariah Gonzalez is a 29 y.o. female  Chief Complaint  Patient presents with  . Insomnia    Takes melatonin, Tylenol PM, Lamactil and Zyrtec before bed.   . Back Spasms   SLEEP APNEA Sleep apnea status: unsure needs test Duration: chronic Satisfied with current treatment?:  no Last sleep study: N/A Treatments attempted: lactmical, zyrtec, valarian, melatonin, camomile, 1 hour to fall asleep  Wakes feeling refreshed:  no Daytime hypersomnolence:  yes Fatigue:  yes Insomnia:  yes Good sleep hygiene:  yes Difficulty falling asleep:  yes Difficulty staying asleep:  yes Snoring bothers bed partner:  no Observed apnea by bed partner: yes Obesity:  yes Hypertension: yes  Pulmonary hypertension:  no Coronary artery disease:  no  INSOMNIA Duration: chronic Satisfied with sleep quality: no Difficulty falling asleep: yes Difficulty staying asleep: yes Waking a few hours after sleep onset: yes Early morning awakenings: no Daytime hypersomnolence: yes Wakes feeling refreshed: no Good sleep hygiene: yes Apnea: no Snoring: yes Depressed/anxious mood: yes Recent stress: yes Restless legs/nocturnal leg cramps: no Chronic pain/arthritis: yes History of sleep study: no Treatments attempted: melatonin, uinsom and benadryl   Back has been bothering her a long time since she was 29yo, comes on at random, nothing makes it better or worse. Flexeril helps with series of them.  Relevant past medical, surgical, family and social history reviewed and updated as indicated. Interim medical history since our last visit reviewed. Allergies and medications reviewed and updated.  Review of Systems  Respiratory: Negative.     Cardiovascular: Negative.   Gastrointestinal: Negative.   Musculoskeletal: Positive for back pain and myalgias. Negative for arthralgias, gait problem, joint swelling, neck pain and neck stiffness.  Skin: Negative.   Neurological: Negative.   Psychiatric/Behavioral: Positive for dysphoric mood and sleep disturbance. Negative for agitation, behavioral problems, confusion, decreased concentration, hallucinations, self-injury and suicidal ideas. The patient is nervous/anxious. The patient is not hyperactive.     Per HPI unless specifically indicated above     Objective:    BP 132/88 (BP Location: Left Arm, Cuff Size: Large)   Pulse 89   Temp 98.6 F (37 C) (Oral)   Wt 205 lb (93 kg)   SpO2 98%   BMI 38.73 kg/m   Wt Readings from Last 3 Encounters:  10/13/19 205 lb (93 kg)  08/02/19 196 lb (88.9 kg)  09/17/17 196 lb (88.9 kg)    Physical Exam Vitals and nursing note reviewed.  Constitutional:      General: She is not in acute distress.    Appearance: Normal appearance. She is not ill-appearing, toxic-appearing or diaphoretic.  HENT:     Head: Normocephalic and atraumatic.     Right Ear: External ear normal.     Left Ear: External ear normal.     Nose: Nose normal.     Mouth/Throat:     Mouth: Mucous membranes are moist.     Pharynx: Oropharynx is clear.  Eyes:     General: No scleral icterus.       Right eye: No discharge.        Left eye: No  discharge.     Extraocular Movements: Extraocular movements intact.     Conjunctiva/sclera: Conjunctivae normal.     Pupils: Pupils are equal, round, and reactive to light.  Cardiovascular:     Rate and Rhythm: Normal rate and regular rhythm.     Pulses: Normal pulses.     Heart sounds: Normal heart sounds. No murmur heard.  No friction rub. No gallop.   Pulmonary:     Effort: Pulmonary effort is normal. No respiratory distress.     Breath sounds: Normal breath sounds. No stridor. No wheezing, rhonchi or rales.  Chest:      Chest wall: No tenderness.  Musculoskeletal:        General: Normal range of motion.     Cervical back: Normal range of motion and neck supple.  Skin:    General: Skin is warm and dry.     Capillary Refill: Capillary refill takes less than 2 seconds.     Coloration: Skin is not jaundiced or pale.     Findings: No bruising, erythema, lesion or rash.  Neurological:     General: No focal deficit present.     Mental Status: She is alert and oriented to person, place, and time. Mental status is at baseline.  Psychiatric:        Mood and Affect: Mood normal.        Behavior: Behavior normal.        Thought Content: Thought content normal.        Judgment: Judgment normal.     Results for orders placed or performed in visit on 08/02/19  GC/Chlamydia Probe Amp   Specimen: Urine   UR  Result Value Ref Range   Chlamydia trachomatis, NAA Negative Negative   Neisseria Gonorrhoeae by PCR Negative Negative  HIV Antibody (routine testing w rflx)  Result Value Ref Range   HIV Screen 4th Generation wRfx Non Reactive Non Reactive  CBC with Differential/Platelet  Result Value Ref Range   WBC 8.0 3.4 - 10.8 x10E3/uL   RBC 5.23 3.77 - 5.28 x10E6/uL   Hemoglobin 14.5 11.1 - 15.9 g/dL   Hematocrit 49.6 75.9 - 46.6 %   MCV 83 79 - 97 fL   MCH 27.7 26.6 - 33.0 pg   MCHC 33.3 31 - 35 g/dL   RDW 16.3 84.6 - 65.9 %   Platelets 305 150 - 450 x10E3/uL   Neutrophils 56 Not Estab. %   Lymphs 37 Not Estab. %   Monocytes 7 Not Estab. %   Eos 0 Not Estab. %   Basos 0 Not Estab. %   Neutrophils Absolute 4.5 1 - 7 x10E3/uL   Lymphocytes Absolute 3.0 0 - 3 x10E3/uL   Monocytes Absolute 0.5 0 - 0 x10E3/uL   EOS (ABSOLUTE) 0.0 0.0 - 0.4 x10E3/uL   Basophils Absolute 0.0 0 - 0 x10E3/uL   Immature Granulocytes 0 Not Estab. %   Immature Grans (Abs) 0.0 0.0 - 0.1 x10E3/uL  Comprehensive metabolic panel  Result Value Ref Range   Glucose 96 65 - 99 mg/dL   BUN 15 6 - 20 mg/dL   Creatinine, Ser 9.35 0.57 -  1.00 mg/dL   GFR calc non Af Amer 81 >59 mL/min/1.73   GFR calc Af Amer 94 >59 mL/min/1.73   BUN/Creatinine Ratio 16 9 - 23   Sodium 137 134 - 144 mmol/L   Potassium 4.2 3.5 - 5.2 mmol/L   Chloride 101 96 - 106 mmol/L   CO2 22 20 -  29 mmol/L   Calcium 9.6 8.7 - 10.2 mg/dL   Total Protein 7.5 6.0 - 8.5 g/dL   Albumin 4.7 3.9 - 5.0 g/dL   Globulin, Total 2.8 1.5 - 4.5 g/dL   Albumin/Globulin Ratio 1.7 1.2 - 2.2   Bilirubin Total <0.2 0.0 - 1.2 mg/dL   Alkaline Phosphatase 91 48 - 121 IU/L   AST 20 0 - 40 IU/L   ALT 17 0 - 32 IU/L  Lipid Panel w/o Chol/HDL Ratio  Result Value Ref Range   Cholesterol, Total 241 (H) 100 - 199 mg/dL   Triglycerides 161 (H) 0 - 149 mg/dL   HDL 42 >09 mg/dL   VLDL Cholesterol Cal 52 (H) 5 - 40 mg/dL   LDL Chol Calc (NIH) 604 (H) 0 - 99 mg/dL  TSH  Result Value Ref Range   TSH 1.180 0.450 - 4.500 uIU/mL  Urinalysis, Routine w reflex microscopic  Result Value Ref Range   Specific Gravity, UA 1.020 1.005 - 1.030   pH, UA 5.0 5.0 - 7.5   Color, UA Yellow Yellow   Appearance Ur Clear Clear   Leukocytes,UA Negative Negative   Protein,UA Negative Negative/Trace   Glucose, UA Negative Negative   Ketones, UA Negative Negative   RBC, UA Negative Negative   Bilirubin, UA Negative Negative   Urobilinogen, Ur 0.2 0.2 - 1.0 mg/dL   Nitrite, UA Negative Negative  Microalbumin, Urine Waived  Result Value Ref Range   Microalb, Ur Waived 10 0 - 19 mg/L   Creatinine, Urine Waived 100 10 - 300 mg/dL   Microalb/Creat Ratio <30 <30 mg/g  Lamotrigine level  Result Value Ref Range   Lamotrigine Lvl <1.0 (L) 2.0 - 20.0 ug/mL  HM PAP SMEAR  Result Value Ref Range   HM Pap smear ASCUS HPV neg   Iron and TIBC  Result Value Ref Range   Total Iron Binding Capacity 330 250 - 450 ug/dL   UIBC 540 981 - 191 ug/dL   Iron 41 27 - 478 ug/dL   Iron Saturation 12 (L) 15 - 55 %  Hepatitis panel, acute  Result Value Ref Range   Hep A IgM Negative Negative   Hepatitis B  Surface Ag Negative Negative   Hep B C IgM Negative Negative   Hep C Virus Ab <0.1 0.0 - 0.9 s/co ratio  HSV(herpes simplex vrs) 1+2 ab-IgG  Result Value Ref Range   HSV 1 Glycoprotein G Ab, IgG 23.40 (H) 0.00 - 0.90 index   HSV 2 IgG, Type Spec <0.91 0.00 - 0.90 index  RPR  Result Value Ref Range   RPR Ser Ql Non Reactive Non Reactive  Ferritin  Result Value Ref Range   Ferritin 16 15.0 - 150.0 ng/mL      Assessment & Plan:   Problem List Items Addressed This Visit    None    Visit Diagnoses    Primary insomnia    -  Primary   Will get her sleep study and continue to follow with psychiatry. Await sleep study. Call with any concerns.    Relevant Orders   Ambulatory referral to Sleep Studies   Snoring       Will get her sleep study and continue to follow with psychiatry. Await sleep study. Call with any concerns.    Relevant Orders   Ambulatory referral to Sleep Studies   Chronic bilateral low back pain, unspecified whether sciatica present       Will obtain x-ray and  treat with flexeril and stretches. Call if not getting better or getting worse.    Relevant Medications   cyclobenzaprine (FLEXERIL) 10 MG tablet   Other Relevant Orders   DG Lumbar Spine Complete       Follow up plan: Return if symptoms worsen or fail to improve.

## 2019-10-17 ENCOUNTER — Encounter: Payer: Self-pay | Admitting: Family Medicine

## 2019-10-24 ENCOUNTER — Other Ambulatory Visit: Payer: Self-pay

## 2019-10-24 ENCOUNTER — Ambulatory Visit
Admission: RE | Admit: 2019-10-24 | Discharge: 2019-10-24 | Disposition: A | Discharge status: Home / Self Care | Payer: BC Managed Care – PPO | Attending: Family Medicine | Admitting: Family Medicine

## 2019-10-24 ENCOUNTER — Ambulatory Visit
Admission: RE | Admit: 2019-10-24 | Discharge: 2019-10-24 | Disposition: A | Discharge status: Home / Self Care | Payer: BC Managed Care – PPO | Source: Ambulatory Visit | Attending: Family Medicine | Admitting: Family Medicine

## 2019-10-24 DIAGNOSIS — M545 Low back pain: Secondary | ICD-10-CM | POA: Insufficient documentation

## 2019-10-24 DIAGNOSIS — G8929 Other chronic pain: Secondary | ICD-10-CM | POA: Diagnosis present

## 2019-11-01 ENCOUNTER — Encounter: Payer: Self-pay | Admitting: Family Medicine

## 2019-11-10 ENCOUNTER — Other Ambulatory Visit: Payer: Self-pay

## 2019-11-10 ENCOUNTER — Encounter: Payer: Self-pay | Admitting: Licensed Clinical Social Worker

## 2019-11-10 ENCOUNTER — Ambulatory Visit (INDEPENDENT_AMBULATORY_CARE_PROVIDER_SITE_OTHER): Payer: BC Managed Care – PPO | Admitting: Licensed Clinical Social Worker

## 2019-11-10 DIAGNOSIS — F4310 Post-traumatic stress disorder, unspecified: Secondary | ICD-10-CM

## 2019-11-10 DIAGNOSIS — Z8659 Personal history of other mental and behavioral disorders: Secondary | ICD-10-CM | POA: Diagnosis not present

## 2019-11-10 DIAGNOSIS — F4010 Social phobia, unspecified: Secondary | ICD-10-CM

## 2019-11-10 DIAGNOSIS — F3131 Bipolar disorder, current episode depressed, mild: Secondary | ICD-10-CM
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
## 2019-11-22 ENCOUNTER — Ambulatory Visit (INDEPENDENT_AMBULATORY_CARE_PROVIDER_SITE_OTHER): Payer: BC Managed Care – PPO | Admitting: Licensed Clinical Social Worker

## 2019-11-22 ENCOUNTER — Encounter: Payer: Self-pay | Admitting: Psychiatry

## 2019-11-22 ENCOUNTER — Telehealth (INDEPENDENT_AMBULATORY_CARE_PROVIDER_SITE_OTHER): Payer: BC Managed Care – PPO | Admitting: Psychiatry

## 2019-11-22 ENCOUNTER — Encounter: Payer: Self-pay | Admitting: Licensed Clinical Social Worker

## 2019-11-22 ENCOUNTER — Other Ambulatory Visit: Payer: Self-pay

## 2019-11-22 DIAGNOSIS — F4010 Social phobia, unspecified: Secondary | ICD-10-CM | POA: Diagnosis not present

## 2019-11-22 DIAGNOSIS — F3131 Bipolar disorder, current episode depressed, mild: Secondary | ICD-10-CM

## 2019-11-22 DIAGNOSIS — Z8659 Personal history of other mental and behavioral disorders: Secondary | ICD-10-CM | POA: Diagnosis not present

## 2019-11-22 DIAGNOSIS — F4310 Post-traumatic stress disorder, unspecified: Secondary | ICD-10-CM | POA: Diagnosis not present

## 2019-11-22 DIAGNOSIS — F17200 Nicotine dependence, unspecified, uncomplicated: Secondary | ICD-10-CM

## 2019-11-22 MED ORDER — VENLAFAXINE HCL ER 75 MG PO CP24: 75.0000 mg | ORAL_CAPSULE | Freq: Every day | ORAL | 1 refills | Status: DC

## 2019-11-22 MED ORDER — ZOLPIDEM TARTRATE 5 MG PO TABS: 5.0000 mg | ORAL_TABLET | Freq: Every evening | ORAL | 0 refills | Status: DC | PRN

## 2019-11-22 NOTE — Progress Notes (Signed)
Patient Location: Home  Provider Location: Home Office   Virtual Visit via Video Note  I connected with Mariah Gonzalez on 11/22/19 at  2:00 PM EDT by a video enabled telemedicine application and verified that I am speaking with the correct person using two identifiers.   I discussed the limitations of evaluation and management by telemedicine and the availability of in person appointments. The patient expressed understanding and agreed to proceed.  THERAPY PROGRESS NOTE  Session Time: 30 Minutes  Participation Level: Active  Behavioral Response: CasualAlertAnxious  Type of Therapy: Individual Therapy  Treatment Goals addressed: Anxiety and Coping  Interventions: CBT  Summary: Mariah Gonzalez is a 29 y.o. female who presents with depression and anxiety sxs. Pt identified current stressors and attempts to manage sxs. Pt identified goals for therapy with focus on improving overall mood and managing sxs associated with diagnoses.   Suicidal/Homicidal: No  Therapist Response: Therapist met with patient for first session since completing CCA. Therapist and patient completed treatment plan. Pt in agreement with goals. Therapist validated patient feelings and thoughts related to current stressors and explored goals.   Plan: Return again in 2 weeks.  Diagnosis: Axis I: Bipolar, Depressed, Post Traumatic Stress Disorder and Social Anxiety    Axis II: N/A  [name], LCSW, LCAS 11/22/2019  Follow Up Instructions:  I discussed the treatment plan with the patient. The patient was provided an opportunity to ask questions and all were answered. The patient agreed with the plan and demonstrated an understanding of the instructions.   The patient was advised to call back or seek an in-person evaluation if the symptoms worsen or if the condition fails to improve as anticipated.  I provided 30 minutes of non-face-to-face time during this encounter.  Katerina Zurn Wynelle Link,  LCSW, LCAS

## 2019-11-22 NOTE — Patient Instructions (Signed)
Zolpidem tablets What is this medicine? ZOLPIDEM (zole PI dem) is used to treat insomnia. This medicine helps you to fall asleep and sleep through the night. This medicine may be used for other purposes; ask your health care provider or pharmacist if you have questions. COMMON BRAND NAME(S): Ambien What should I tell my health care provider before I take this medicine? They need to know if you have any of these conditions:  depression  history of drug abuse or addiction  if you often drink alcohol  liver disease  lung or breathing disease  myasthenia gravis  sleep apnea  sleep-walking, driving, eating or other activity while not fully awake after taking a sleep medicine  suicidal thoughts, plans, or attempt; a previous suicide attempt by you or a family member  an unusual or allergic reaction to zolpidem, other medicines, foods, dyes, or preservatives  pregnant or trying to get pregnant  breast-feeding How should I use this medicine? Take this medicine by mouth with a glass of water. Follow the directions on the prescription label. It is better to take this medicine on an empty stomach and only when you are ready for bed. Do not take your medicine more often than directed. If you have been taking this medicine for several weeks and suddenly stop taking it, you may get unpleasant withdrawal symptoms. Your doctor or health care professional may want to gradually reduce the dose. Do not stop taking this medicine on your own. Always follow your doctor or health care professional's advice. A special MedGuide will be given to you by the pharmacist with each prescription and refill. Be sure to read this information carefully each time. Talk to your pediatrician regarding the use of this medicine in children. Special care may be needed. Overdosage: If you think you have taken too much of this medicine contact a poison control center or emergency room at once. NOTE: This medicine is only  for you. Do not share this medicine with others. What if I miss a dose? This does not apply. This medicine should only be taken immediately before going to sleep. Do not take double or extra doses. What may interact with this medicine?  alcohol  antihistamines for allergy, cough and cold  certain medicines for anxiety or sleep  certain medicines for depression, like amitriptyline, fluoxetine, sertraline  certain medicines for fungal infections like ketoconazole and itraconazole  certain medicines for seizures like phenobarbital, primidone  ciprofloxacin  dietary supplements for sleep, like valerian or kava kava  general anesthetics like halothane, isoflurane, methoxyflurane, propofol  local anesthetics like lidocaine, pramoxine, tetracaine  medicines that relax muscles for surgery  narcotic medicines for pain  phenothiazines like chlorpromazine, mesoridazine, prochlorperazine, thioridazine  rifampin This list may not describe all possible interactions. Give your health care provider a list of all the medicines, herbs, non-prescription drugs, or dietary supplements you use. Also tell them if you smoke, drink alcohol, or use illegal drugs. Some items may interact with your medicine. What should I watch for while using this medicine? Visit your doctor or health care professional for regular checks on your progress. Keep a regular sleep schedule by going to bed at about the same time each night. Avoid caffeine-containing drinks in the evening hours. When sleep medicines are used every night for more than a few weeks, they may stop working. Talk to your doctor if you still have trouble sleeping. After taking this medicine, you may get up out of bed and do an activity that you   do not know you are doing. The next morning, you may have no memory of this. Activities include driving a car ("sleep-driving"), making and eating food, talking on the phone, sexual activity, and sleep-walking.  Serious injuries have occurred. Stop the medicine and call your doctor right away if you find out you have done any of these activities. Do not take this medicine if you have used alcohol that evening. Do not take it if you have taken another medicine for sleep. The risk of doing these sleep-related activities is higher. Wait for at least 8 hours after you take a dose before driving or doing other activities that require full mental alertness. Do not take this medicine unless you are able to stay in bed for a full night (7 to 8 hours) before you must be active again. You may have a decrease in mental alertness the day after use, even if you feel that you are fully awake. Tell your doctor if you will need to perform activities requiring full alertness, such as driving, the next day. Do not stand or sit up quickly after taking this medicine, especially if you are an older patient. This reduces the risk of dizzy or fainting spells. If you or your family notice any changes in your behavior, such as new or worsening depression, thoughts of harming yourself, anxiety, other unusual or disturbing thoughts, or memory loss, call your doctor right away. After you stop taking this medicine, you may have trouble falling asleep. This is called rebound insomnia. This problem usually goes away on its own after 1 or 2 nights. What side effects may I notice from receiving this medicine? Side effects that you should report to your doctor or health care professional as soon as possible:  allergic reactions like skin rash, itching or hives, swelling of the face, lips, or tongue  breathing problems  changes in vision  confusion  depressed mood or other changes in moods or emotions  feeling faint or lightheaded, falls  hallucinations  loss of balance or coordination  loss of memory  numbness or tingling of the tongue  restlessness, excitability, or feelings of anxiety or agitation  signs and symptoms of liver  injury like dark yellow or brown urine; general ill feeling or flu-like symptoms; light-colored stools; loss of appetite; nausea; right upper belly pain; unusually weak or tired; yellowing of the eyes or skin  suicidal thoughts  unusual activities while not fully awake like driving, eating, making phone calls, or sexual activity Side effects that usually do not require medical attention (report to your doctor or health care professional if they continue or are bothersome):  dizziness  drowsiness the day after you take this medicine  headache This list may not describe all possible side effects. Call your doctor for medical advice about side effects. You may report side effects to FDA at 1-800-FDA-1088. Where should I keep my medicine? Keep out of the reach of children. This medicine can be abused. Keep your medicine in a safe place to protect it from theft. Do not share this medicine with anyone. Selling or giving away this medicine is dangerous and against the law. This medicine may cause accidental overdose and death if taken by other adults, children, or pets. Mix any unused medicine with a substance like cat litter or coffee grounds. Then throw the medicine away in a sealed container like a sealed bag or a coffee can with a lid. Do not use the medicine after the expiration date. Store at   room temperature between 20 and 25 degrees C (68 and 77 degrees F). NOTE: This sheet is a summary. It may not cover all possible information. If you have questions about this medicine, talk to your doctor, pharmacist, or health care provider.  2020 Elsevier/Gold Standard (2017-10-16 11:51:08)  

## 2019-11-22 NOTE — Progress Notes (Signed)
Provider Location : [facility] Patient Location : Home  Participants: Patient , Provider  Virtual Visit via Video Note  I connected with Mariah Gonzalez on 11/22/19 at  3:30 PM EDT by a video enabled telemedicine application and verified that I am speaking with the correct person using two identifiers.   I discussed the limitations of evaluation and management by telemedicine and the availability of in person appointments. The patient expressed understanding and agreed to proceed.     I discussed the assessment and treatment plan with the patient. The patient was provided an opportunity to ask questions and all were answered. The patient agreed with the plan and demonstrated an understanding of the instructions.   The patient was advised to call back or seek an in-person evaluation if the symptoms worsen or if the condition fails to improve as anticipated.   BH MD OP Progress Note  11/22/2019 3:51 PM Mariah Gonzalez  MRN:  [id number]  Chief Complaint:  Chief Complaint    Follow-up     HPI: Mariah Gonzalez is a 29 year old Caucasian female, employed, lives in [place], has a history of bipolar disorder, PTSD, ADHD, dysfunctional uterine bleeding, sleep problems awaiting sleep study was evaluated by telemedicine today.  Patient today reports she continues to struggle with her own health problems.  She reports she continues to have dysfunctional uterine bleeding.  Her colposcopy went well.  She reports she is trying to reach out to her OB/GYN for further management at this time.  She also reports she has psychosocial stressors of her dad being hospitalized and she being unable to visit him due to COVID-19 precautions.  Her wife also is currently scheduled for surgery next week.  She hence reports her anxiety symptoms as highly at this time.  The hydroxyzine does not seem to help much with anxiety or sleep.  She reports she currently is able to sleep only 2 to 3 hours at night.  She  does have a lot of nightmares.  She is compliant on the Lamictal which helps.  She reports the Lamictal levels came back as very low however she reports that she has noticed a big difference when she stays on the Lamictal.  She hence wants to stay on it.  Patient reports her therapy visits is going well.  She denies any suicidality, homicidality or perceptual disturbances.  She continues to smoke cigarettes and is not ready to cut back at this time.      Visit Diagnosis:    ICD-10-CM   1. Bipolar 1 disorder, depressed, mild (HCC)  F31.31 venlafaxine XR (EFFEXOR XR) 75 MG 24 hr capsule  2. PTSD (post-traumatic stress disorder)  F43.10 venlafaxine XR (EFFEXOR XR) 75 MG 24 hr capsule    zolpidem (AMBIEN) 5 MG tablet  3. Social anxiety disorder  F40.10   4. History of ADHD  Z86.59   5. Tobacco use disorder  F17.200     Past Psychiatric History: I have reviewed past psychiatric history from my progress note on 10/11/2019.  Past trials of Lamictal, Celexa,Adzenys XR  Past Medical History:  Past Medical History:  Diagnosis Date  . ADHD   . Anxiety   . Asthma   . Depressed   . DUB (dysfunctional uterine bleeding)   . IBS (irritable bowel syndrome)    History reviewed. No pertinent surgical history.  Family Psychiatric History: I have reviewed family psychiatric history from my progress note on 10/11/2019  Family History:  Family History  Problem Relation  Age of Onset  . Hyperlipidemia Mother   . Hypertension Mother   . Depression Mother   . Hyperlipidemia Father   . Hypertension Father   . Mental illness Father   . Cancer Maternal Aunt   . Cancer Maternal Uncle   . Cancer Paternal Aunt   . Hearing loss Paternal Aunt   . Cancer Paternal Uncle   . Hearing loss Paternal Uncle     Social History: I have reviewed social history from my progress note on 10/11/2019 Social History   Socioeconomic History  . Marital status: Married    Spouse name: Not on file  . Number of  children: Not on file  . Years of education: Not on file  . Highest education level: Not on file  Occupational History  . Not on file  Tobacco Use  . Smoking status: Current Every Day Smoker    Packs/day: 0.25    Types: Cigarettes  . Smokeless tobacco: Never Used  Substance and Sexual Activity  . Alcohol use: No  . Drug use: Never  . Sexual activity: Yes  Other Topics Concern  . Not on file  Social History Narrative  . Not on file   Social Determinants of Health   Financial Resource Strain:   . Difficulty of Paying Living Expenses: Not on file  Food Insecurity:   . Worried About Programme researcher, broadcasting/film/video in the Last Year: Not on file  . Ran Out of Food in the Last Year: Not on file  Transportation Needs:   . Lack of Transportation (Medical): Not on file  . Lack of Transportation (Non-Medical): Not on file  Physical Activity:   . Days of Exercise per Week: Not on file  . Minutes of Exercise per Session: Not on file  Stress:   . Feeling of Stress : Not on file  Social Connections:   . Frequency of Communication with Friends and Family: Not on file  . Frequency of Social Gatherings with Friends and Family: Not on file  . Attends Religious Services: Not on file  . Active Member of Clubs or Organizations: Not on file  . Attends Banker Meetings: Not on file  . Marital Status: Not on file    Allergies: No Known Allergies  Metabolic Disorder Labs: No results found for: HGBA1C, MPG No results found for: PROLACTIN Lab Results  Component Value Date   CHOL 241 (H) 08/02/2019   TRIG 286 (H) 08/02/2019   HDL 42 08/02/2019   LDLCALC 147 (H) 08/02/2019   Lab Results  Component Value Date   TSH 1.180 08/02/2019   TSH 2.52 11/19/2012    Therapeutic Level Labs: No results found for: LITHIUM No results found for: VALPROATE No components found for:  CBMZ  Current Medications: Current Outpatient Medications  Medication Sig Dispense Refill  . cetirizine (ZYRTEC  ALLERGY) 10 MG tablet Take 10 mg by mouth daily.    . cyclobenzaprine (FLEXERIL) 10 MG tablet Take 1 tablet (10 mg total) by mouth at bedtime. 30 tablet 2  . diphenhydramine-acetaminophen (TYLENOL PM) 25-500 MG TABS tablet Take 1 tablet by mouth at bedtime as needed.    . etonogestrel-ethinyl estradiol (NUVARING) 0.12-0.015 MG/24HR vaginal ring Place vaginally.    . hydrOXYzine (ATARAX/VISTARIL) 25 MG tablet Take 1-2 tablets (25-50 mg total) by mouth at bedtime as needed. FOR SLEEP (Patient not taking: Reported on 10/13/2019) 60 tablet 1  . lamoTRIgine (LAMICTAL) 200 MG tablet Take 1 tablet (200 mg total) by mouth daily.  90 tablet 0  . lamoTRIgine (LAMICTAL) 25 MG tablet Take 1 tablet (25 mg total) by mouth daily. To be combined with 200 mg daily at bedtime 30 tablet 1  . lidocaine (XYLOCAINE) 2 % jelly Apply topically. (Patient not taking: Reported on 10/13/2019)    . MELATONIN PO Take by mouth.    Marland Kitchen omeprazole (PRILOSEC) 20 MG capsule Take 20 mg by mouth daily.    Marland Kitchen venlafaxine XR (EFFEXOR XR) 75 MG 24 hr capsule Take 1 capsule (75 mg total) by mouth daily with breakfast. 30 capsule 1  . Vitamin D, Ergocalciferol, (DRISDOL) 1.25 MG (50000 UNIT) CAPS capsule Take 50,000 Units by mouth once a week.    . zolpidem (AMBIEN) 5 MG tablet Take 1 tablet (5 mg total) by mouth at bedtime as needed for sleep. 30 tablet 0   No current facility-administered medications for this visit.     Musculoskeletal: Strength & Muscle Tone: UTA Gait & Station: normal Patient leans: N/A  Psychiatric Specialty Exam: Review of Systems  Genitourinary: Positive for vaginal bleeding.  Psychiatric/Behavioral: Positive for sleep disturbance. The patient is nervous/anxious.   All other systems reviewed and are negative.   There were no vitals taken for this visit.There is no height or weight on file to calculate BMI.  General Appearance: Casual  Eye Contact:  Fair  Speech:  Clear and Coherent  Volume:  Normal  Mood:   Anxious  Affect:  Congruent  Thought Process:  Goal Directed and Descriptions of Associations: Intact  Orientation:  Full (Time, Place, and Person)  Thought Content: Logical   Suicidal Thoughts:  No  Homicidal Thoughts:  No  Memory:  Immediate;   Fair Recent;   Fair Remote;   Fair  Judgement:  Fair  Insight:  Fair  Psychomotor Activity:  Normal  Concentration:  Concentration: Fair and Attention Span: Fair  Recall:  [employer] of Knowledge: Fair  Language: Fair  Akathisia:  No  Handed:  Right  AIMS (if indicated): UTA  Assets:  Communication Skills Desire for Improvement Housing Social Support  ADL's:  Intact  Cognition: WNL  Sleep:  Poor   Screenings: GAD-7     Office Visit from 08/02/2019 in [facility]  Total GAD-7 Score 10    PHQ2-9     Office Visit from 08/02/2019 in [facility]  PHQ-2 Total Score 2  PHQ-9 Total Score 11       Assessment and Plan: JINI HORIUCHI is a 29 year old Caucasian female, married, employed, has a history of bipolar disorder, sleep disorder, ADHD, PTSD, DOB was evaluated by telemedicine today.  Patient is biologically predisposed given her history of trauma, her own health problems, family history.  Patient with psychosocial stressors of current relationship struggles, job related stressors, medical problems.  Patient is currently struggling with mood symptoms as well as sleep issues.  Plan as noted below.  Plan Bipolar disorder type I depressed-unstable Lamotrigine 225 mg p.o. daily.  She currently takes it at bedtime since it makes her drowsy. Increase venlafaxine to 75 mg p.o. daily. Add Ambien 5 mg p.o. nightly. Patient is scheduled for sleep study-pending  PTSD-unstable Increase venlafaxine extended release to 75 mg p.o. daily Hydroxyzine 25 to 50 mg p.o. nightly as needed for sleep Start Ambien 5 mg p.o. nightly Continue CBT.  Social anxiety disorder-some progress Increase venlafaxine extended  release to 75 mg p.o. daily Continue CBT  History of ADHD-unstable Patient was advised to sign a release to obtain  medical records from [facility] attention specialist last visit-pending Venlafaxine will also help with ADHD symptoms.  Tobacco use disorder-unstable Provided counseling.  Follow-up in clinic in 4 weeks or sooner if needed.  I have spent atleast 20 minutes face to face by video with patient today. More than 50 % of the time was spent for preparing to see the patient ( e.g., review of test, records ), obtaining and to review and separately obtained history , ordering medications and test ,psychoeducation and supportive psychotherapy and care coordination,as well as documenting clinical information in electronic health record. This note was generated in part or whole with voice recognition software. Voice recognition is usually quite accurate but there are transcription errors that can and very often do occur. I apologize for any typographical errors that were not detected and corrected.        Jomarie Longs, MD 11/22/2019, 3:51 PM

## 2019-12-06 ENCOUNTER — Ambulatory Visit: Payer: BC Managed Care – PPO | Admitting: Licensed Clinical Social Worker

## 2019-12-18 ENCOUNTER — Encounter: Payer: Self-pay | Admitting: Family Medicine

## 2019-12-19 ENCOUNTER — Other Ambulatory Visit: Payer: Self-pay

## 2019-12-19 ENCOUNTER — Encounter: Payer: Self-pay | Admitting: Nurse Practitioner

## 2019-12-19 ENCOUNTER — Telehealth (INDEPENDENT_AMBULATORY_CARE_PROVIDER_SITE_OTHER): Payer: BC Managed Care – PPO | Admitting: Nurse Practitioner

## 2019-12-19 VITALS — Temp 99.1°F

## 2019-12-19 DIAGNOSIS — R053 Chronic cough: Secondary | ICD-10-CM | POA: Insufficient documentation

## 2019-12-19 DIAGNOSIS — R059 Cough, unspecified: Secondary | ICD-10-CM | POA: Diagnosis not present

## 2019-12-19 MED ORDER — GUAIFENESIN-CODEINE 100-10 MG/5ML PO SOLN
5.0000 mL | Freq: Two times a day (BID) | ORAL | 0 refills | Status: DC | PRN
Start: 2019-12-19 — End: 2020-01-31

## 2019-12-19 MED ORDER — ALBUTEROL SULFATE HFA 108 (90 BASE) MCG/ACT IN AERS: 2.0000 | INHALATION_SPRAY | Freq: Four times a day (QID) | RESPIRATORY_TRACT | 1 refills | Status: DC | PRN

## 2019-12-19 MED ORDER — BENZONATATE 200 MG PO CAPS: 200.0000 mg | ORAL_CAPSULE | Freq: Two times a day (BID) | ORAL | 0 refills | Status: DC | PRN

## 2019-12-19 MED ORDER — PREDNISONE 20 MG PO TABS: 40.0000 mg | ORAL_TABLET | Freq: Every day | ORAL | 0 refills | Status: AC

## 2019-12-19 NOTE — Addendum Note (Signed)
Addended by: Pablo Ledger on: 12/19/2019 04:38 PM   Modules accepted: Orders

## 2019-12-19 NOTE — Patient Instructions (Signed)

## 2019-12-19 NOTE — Progress Notes (Signed)
Temp 99.1 F (37.3 C) (Oral)    Subjective:    Patient ID: Mariah Gonzalez, female    DOB: [birth year], 29 y.o.   MRN: [id number]  HPI: Mariah Gonzalez is a 29 y.o. female  Chief Complaint  Patient presents with  . Ear Pain    Started on Thursday, concerned that this may be turning in to bronchitis  . Cough    on Friday using Dayquil, and Nyquil, starting to now become more of a bark than a cough.  . covid vacc booster    On Friday afternoon  . Nasal Congestion    on Friday evening, taking Dayquil, and Nyquil  . hot and cold    hot with a blanket, then cold with out.  . Flu Vaccine    Patient would like to get this vaccination, but would need Egg free, where should she go?    . This visit was completed via telephone due to the restrictions of the COVID-19 pandemic. All issues as above were discussed and addressed but no physical exam was performed. If it was felt that the patient should be evaluated in the office, they were directed there. The patient verbally consented to this visit. Patient was unable to complete an audio/visual visit due to Technical difficulties,Lack of internet. Due to the catastrophic nature of the COVID-19 pandemic, this visit was done through audio contact only. . Location of the patient: home . Location of the provider: work . Those involved with this call:  . Provider: Aura Dials, DNP . CMA: Wilhemena Durie, CMA . Front Desk/Registration: Harriet Pho  . Time spent on call: 20 minutes on the phone discussing health concerns. 15 minutes total spent in review of patient's record and preparation of their chart.  . I verified patient identity using two factors (patient name and date of birth). Patient consents verbally to being seen via telemedicine visit today.    UPPER RESPIRATORY TRACT INFECTION Started with initial symptoms Thursday with ear discomfort.  Then turned into cough and congestion + chills.  Was feeling a bit cruddy before  Covid booster. Did home test for Covid on Sunday and this was negative.  She is Covid vaccinated with last vaccination on 12/16/19, booster. Fever: yes into 100 range Cough: yes -- dry and wet cough Shortness of breath: if moving around gets winded a little easier. 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 Interim medical history since our last visit reviewed. Allergies and medications reviewed and updated.  Review of Systems  Constitutional: Positive for chills, fatigue and fever. Negative for activity change and appetite change.  HENT: Positive for congestion. Negative for ear discharge, ear pain, facial swelling, postnasal drip, rhinorrhea, sinus pressure, sinus pain, sneezing, sore throat and voice change.   Eyes: Negative for pain and visual disturbance.  Respiratory: Positive for cough and chest tightness. Negative for  shortness of breath and wheezing.   Cardiovascular: Negative for chest pain, palpitations and leg swelling.  Gastrointestinal: Positive for nausea. Negative for abdominal distention, abdominal pain, constipation, diarrhea and vomiting.  Endocrine: Negative.   Musculoskeletal: Negative for myalgias.  Neurological: Positive for headaches. Negative for dizziness and numbness.    Psychiatric/Behavioral: Negative.     Per HPI unless specifically indicated above     Objective:    Temp 99.1 F (37.3 C) (Oral)   Wt Readings from Last 3 Encounters:  10/13/19 205 lb (93 kg)  08/02/19 196 lb (88.9 kg)  09/17/17 196 lb (88.9 kg)    Physical Exam   Unable to perform due to telephone visit only, patient audio not working.  Results for orders placed or performed in visit on 08/02/19  GC/Chlamydia Probe Amp   Specimen: Urine   UR  Result Value Ref Range   Chlamydia trachomatis, NAA Negative Negative   Neisseria Gonorrhoeae by PCR Negative Negative  HIV Antibody (routine testing w rflx)  Result Value Ref Range   HIV Screen 4th Generation wRfx Non Reactive Non Reactive  CBC with Differential/Platelet  Result Value Ref Range   WBC 8.0 3.4 - 10.8 x10E3/uL   RBC 5.23 3.77 - 5.28 x10E6/uL   Hemoglobin 14.5 11.1 - 15.9 g/dL   Hematocrit 77.8 24.2 - 46.6 %   MCV 83 79 - 97 fL   MCH 27.7 26.6 - 33.0 pg   MCHC 33.3 31 - 35 g/dL   RDW 35.3 61.4 - 43.1 %   Platelets 305 150 - 450 x10E3/uL   Neutrophils 56 Not Estab. %   Lymphs 37 Not Estab. %   Monocytes 7 Not Estab. %   Eos 0 Not Estab. %   Basos 0 Not Estab. %   Neutrophils Absolute 4.5 1.40 - 7.00 x10E3/uL   Lymphocytes Absolute 3.0 0 - 3 x10E3/uL   Monocytes Absolute 0.5 0 - 0 x10E3/uL   EOS (ABSOLUTE) 0.0 0.0 - 0.4 x10E3/uL   Basophils Absolute 0.0 0 - 0 x10E3/uL   Immature Granulocytes 0 Not Estab. %   Immature Grans (Abs) 0.0 0.0 - 0.1 x10E3/uL  Comprehensive metabolic panel  Result Value Ref Range   Glucose 96 65 - 99 mg/dL   BUN 15 6 - 20 mg/dL   Creatinine, Ser 5.40 0.57 - 1.00 mg/dL   GFR calc non Af Amer 81 >59 mL/min/1.73   GFR calc Af Amer 94 >59 mL/min/1.73   BUN/Creatinine Ratio 16 9 - 23   Sodium 137 134 - 144 mmol/L   Potassium 4.2 3.5 - 5.2 mmol/L   Chloride 101 96 - 106 mmol/L   CO2 22 20 - 29 mmol/L   Calcium 9.6 8.7 - 10.2 mg/dL   Total Protein 7.5 6.0 - 8.5 g/dL   Albumin 4.7  3.9 - 5.0 g/dL   Globulin, Total 2.8 1.5 - 4.5 g/dL   Albumin/Globulin Ratio 1.7 1.2 - 2.2   Bilirubin Total <0.2 0.0 - 1.2 mg/dL   Alkaline Phosphatase 91 48 - 121 IU/L   AST 20 0 - 40 IU/L   ALT 17 0 - 32 IU/L  Lipid Panel w/o Chol/HDL Ratio  Result Value Ref Range   Cholesterol, Total 241 (H) 100 - 199 mg/dL   Triglycerides 086 (H) 0 - 149 mg/dL   HDL 42 >76 mg/dL   VLDL Cholesterol Cal 52 (H) 5 - 40 mg/dL   LDL Chol Calc (NIH) 195 (H) 0 - 99 mg/dL  TSH  Result Value Ref Range   TSH 1.180 0.450 - 4.500 uIU/mL  Urinalysis, Routine w reflex microscopic  Result Value Ref Range   Specific Gravity, UA 1.020 1.005 - 1.030   pH, UA 5.0 5.0 - 7.5   Color, UA Yellow Yellow   Appearance Ur Clear Clear   Leukocytes,UA Negative Negative   Protein,UA Negative Negative/Trace   Glucose, UA Negative Negative   Ketones, UA Negative Negative   RBC, UA Negative Negative   Bilirubin, UA Negative Negative   Urobilinogen, Ur 0.2 0.2 - 1.0 mg/dL   Nitrite, UA Negative Negative  Microalbumin, Urine Waived  Result Value Ref Range   Microalb, Ur Waived 10 0 - 19 mg/L   Creatinine, Urine Waived 100 10 - 300 mg/dL   Microalb/Creat Ratio <30 <30 mg/g  Lamotrigine level  Result Value Ref Range   Lamotrigine Lvl <1.0 (L) 2.0 - 20.0 ug/mL  HM PAP SMEAR  Result Value Ref Range   HM Pap smear ASCUS HPV neg   Iron and TIBC  Result Value Ref Range   Total Iron Binding Capacity 330 250 - 450 ug/dL   UIBC 902 409 - 735 ug/dL   Iron 41 27 - 329 ug/dL   Iron Saturation 12 (L) 15 - 55 %  Hepatitis panel, acute  Result Value Ref Range   Hep A IgM Negative Negative   Hepatitis B Surface Ag Negative Negative   Hep B C IgM Negative Negative   Hep C Virus Ab <0.1 0.0 - 0.9 s/co ratio  HSV(herpes simplex vrs) 1+2 ab-IgG  Result Value Ref Range   HSV 1 Glycoprotein G Ab, IgG 23.40 (H) 0.00 - 0.90 index   HSV 2 IgG, Type Spec <0.91 0.00 - 0.90 index  RPR  Result Value Ref Range   RPR Ser Ql Non  Reactive Non Reactive  Ferritin  Result Value Ref Range   Ferritin 16 15.0 - 150.0 ng/mL      Assessment & Plan:   Problem List Items Addressed This Visit      Other   Cough - Primary    Acute x 4 days.  Had negative at home Covid test, but recommend repeat here at office.  Order placed for testing.  Will write work note for patient.  Recommend she slef quarantine until results returned and symptoms improved.  Scripts for [employer], Albuterol inhaler, and Guaf-codeine sent.  Will send in Prednisone short burst to help with cough.  Discussed no abx at this time as symptoms have been <7 days and most likely more viral at this time.  Recommend: - Increased rest - Increasing Fluids - Acetaminophen as needed for fever/pain.  - Salt water gargling, chloraseptic spray and throat lozenges - Mucinex.  - Saline sinus flushes or a neti pot.  - Humidifying the air Return to office for worsening or ongoing symptoms.      Relevant Orders   Novel Coronavirus, NAA ([facility])      I discussed the assessment and treatment plan with the patient. The patient was provided an opportunity to ask questions and all were answered. The patient agreed with the plan and demonstrated an understanding of the instructions.   The patient was advised to call back or seek an in-person evaluation if the symptoms worsen or if the condition fails to improve as anticipated.   I provided 21+ minutes of time during this encounter.  Follow up plan: Return if symptoms worsen or fail to improve.

## 2019-12-19 NOTE — Assessment & Plan Note (Signed)
Acute x 4 days.  Had negative at home Covid test, but recommend repeat here at office.  Order placed for testing.  Will write work note for patient.  Recommend she slef quarantine until results returned and symptoms improved.  Scripts for [employer], Albuterol inhaler, and Guaf-codeine sent.  Will send in Prednisone short burst to help with cough.  Discussed no abx at this time as symptoms have been <7 days and most likely more viral at this time.  Recommend: - Increased rest - Increasing Fluids - Acetaminophen as needed for fever/pain.  - Salt water gargling, chloraseptic spray and throat lozenges - Mucinex.  - Saline sinus flushes or a neti pot.  - Humidifying the air Return to office for worsening or ongoing symptoms.

## 2019-12-20 ENCOUNTER — Ambulatory Visit (INDEPENDENT_AMBULATORY_CARE_PROVIDER_SITE_OTHER): Payer: BC Managed Care – PPO | Admitting: Licensed Clinical Social Worker

## 2019-12-20 ENCOUNTER — Encounter: Payer: Self-pay | Admitting: Licensed Clinical Social Worker

## 2019-12-20 ENCOUNTER — Other Ambulatory Visit: Payer: Self-pay

## 2019-12-20 DIAGNOSIS — F4010 Social phobia, unspecified: Secondary | ICD-10-CM | POA: Diagnosis not present

## 2019-12-20 DIAGNOSIS — F3131 Bipolar disorder, current episode depressed, mild: Secondary | ICD-10-CM | POA: Diagnosis not present

## 2019-12-20 DIAGNOSIS — F4310 Post-traumatic stress disorder, unspecified: Secondary | ICD-10-CM

## 2019-12-20 NOTE — Progress Notes (Signed)
Contacted via MyChart  Good news, your Covid test did return negative.  Continue current medications sent in and if ongoing symptoms please let us know.  If you are feeling better, since Covid is negative you can return to work. Keep being awesome!!  Thank you for allowing me to participate in your care. Kindest regards, Finlay Godbee

## 2019-12-20 NOTE — Progress Notes (Signed)
Virtual Visit via Video Note  I connected with [place] on 12/20/19 at  4:00 PM EST by a video enabled telemedicine application and verified that I am speaking with the correct person using two identifiers.  Location: Patient: Home Provider: Home Office   I discussed the limitations of evaluation and management by telemedicine and the availability of in person appointments. The patient expressed understanding and agreed to proceed.  THERAPY PROGRESS NOTE  Session Time: 32 Minutes  Participation Level: Active  Behavioral Response: CasualAlertAnxious  Type of Therapy: Individual Therapy  Treatment Goals addressed: Anxiety and Coping  Interventions: CBT  Summary: Mariah Gonzalez is a 30 y.o. female who presents with depression and anxiety sxs. Pt identified current stressors and attempts to cope. Pt questioned whether she is doing enough to help others and acknowledged ruminating on things outside of her control. Pt provided insight around her reactions to stressors stemming from early experiences and difficulty making sense/finding meaning in her struggles.   Suicidal/Homicidal: No  Therapist Response: Therapist met with patient for follow up. Therapist and patient explored current stressors, perspectives, and attempts to cope. Therapist validated patient's experiences and offered strength-based perspectives. Pt was receptive.   Plan: Return again in 3 weeks.  Diagnosis: Axis I: Bipolar, Depressed, Post Traumatic Stress Disorder and Social Anxiety    Axis II: N/A  [name], LCSW, LCAS 12/20/2019

## 2019-12-21 ENCOUNTER — Encounter: Payer: Self-pay | Admitting: Psychiatry

## 2019-12-21 ENCOUNTER — Telehealth (INDEPENDENT_AMBULATORY_CARE_PROVIDER_SITE_OTHER): Payer: BC Managed Care – PPO | Admitting: Psychiatry

## 2019-12-21 DIAGNOSIS — F3131 Bipolar disorder, current episode depressed, mild: Secondary | ICD-10-CM | POA: Diagnosis not present

## 2019-12-21 DIAGNOSIS — F4310 Post-traumatic stress disorder, unspecified: Secondary | ICD-10-CM

## 2019-12-21 DIAGNOSIS — F4010 Social phobia, unspecified: Secondary | ICD-10-CM | POA: Diagnosis not present

## 2019-12-21 DIAGNOSIS — Z8659 Personal history of other mental and behavioral disorders: Secondary | ICD-10-CM | POA: Diagnosis not present

## 2019-12-21 DIAGNOSIS — F17200 Nicotine dependence, unspecified, uncomplicated: Secondary | ICD-10-CM

## 2019-12-21 MED ORDER — LAMOTRIGINE 25 MG PO TABS: 50.0000 mg | ORAL_TABLET | Freq: Every day | ORAL | 1 refills | Status: DC

## 2019-12-21 MED ORDER — ZOLPIDEM TARTRATE 5 MG PO TABS: 10.0000 mg | ORAL_TABLET | Freq: Every evening | ORAL | 0 refills | Status: DC | PRN

## 2019-12-21 NOTE — Progress Notes (Signed)
Virtual Visit via Video Note  I connected with Mariah Gonzalez on 12/21/19 at  3:00 PM EST by a video enabled telemedicine application and verified that I am speaking with the correct person using two identifiers.  Location Provider Location : [facility] Patient Location : Home  Participants: Patient , Provider   I discussed the limitations of evaluation and management by telemedicine and the availability of in person appointments. The patient expressed understanding and agreed to proceed.     I discussed the assessment and treatment plan with the patient. The patient was provided an opportunity to ask questions and all were answered. The patient agreed with the plan and demonstrated an understanding of the instructions.   The patient was advised to call back or seek an in-person evaluation if the symptoms worsen or if the condition fails to improve as anticipated.    BH MD OP Progress Note  12/21/2019 4:02 PM TARIANA MOLDOVAN  MRN:  [id number]  Chief Complaint:  Chief Complaint    Follow-up     HPI: Mariah Gonzalez is a 29 year old Caucasian female , employed as a Runner, broadcasting/film/video lives in [place], has a history of bipolar disorder, PTSD, ADHD, dysfunctional uterine bleeding, sleep problems was evaluated by telemedicine today.  Patient today reports she is currently struggling with upper respiratory tract infection symptoms.  She has a cough, sore throat, fatigue, myalgia and fever.  She reports she got her COVID booster recently and her symptoms started the day prior to that.  She is hence taking some time off from work.  Patient continues to struggle with mood symptoms.  She does struggle with anxiety which can be overwhelming on and off.  The venlafaxine does help however she reports that when she has these anxiety symptoms it is more intense.  She is compliant on the Lamictal.  Denies any side effects.  Patient reports sleep continues to be restless.  The low dosage of Ambien did  not work.  She continues to have nightmares.  She did have a sleepwalking episode when she was completely aware of what she was doing however reports it happened when she was not on the Ambien.  She was referred for sleep study however she is still waiting.  Patient continues to have work-related stressors.  She is in psychotherapy sessions which helps.  Patient denies any suicidality, homicidality or perceptual disturbances.  She does struggle with dysfunctional uterine bleeding and is currently under the care of her OB/GYN. Patient denies any other concerns today.  Visit Diagnosis:    ICD-10-CM   1. Bipolar 1 disorder, depressed, mild (HCC)  F31.31 lamoTRIgine (LAMICTAL) 25 MG tablet  2. PTSD (post-traumatic stress disorder)  F43.10 zolpidem (AMBIEN) 5 MG tablet  3. Social anxiety disorder  F40.10   4. History of ADHD  Z86.59   5. Tobacco use disorder  F17.200     Past Psychiatric History: I have reviewed past psychiatric history from my progress note on 10/11/2019.  Past trials of Lamictal, Celexa,Adzenys XR  Past Medical History:  Past Medical History:  Diagnosis Date  . ADHD   . Anxiety   . Asthma   . Depressed   . DUB (dysfunctional uterine bleeding)   . IBS (irritable bowel syndrome)    History reviewed. No pertinent surgical history.  Family Psychiatric History: I have reviewed family psychiatric history from my progress note on 10/11/2019  Family History:  Family History  Problem Relation Age of Onset  . Hyperlipidemia Mother   .  Hypertension Mother   . Depression Mother   . Hyperlipidemia Father   . Hypertension Father   . Mental illness Father   . Cancer Maternal Aunt   . Cancer Maternal Uncle   . Cancer Paternal Aunt   . Hearing loss Paternal Aunt   . Cancer Paternal Uncle   . Hearing loss Paternal Uncle     Social History: Reviewed social history from my progress note on 10/11/2019 Social History   Socioeconomic History  . Marital status: Married     Spouse name: Not on file  . Number of children: Not on file  . Years of education: Not on file  . Highest education level: Not on file  Occupational History  . Not on file  Tobacco Use  . Smoking status: Current Every Day Smoker    Packs/day: 0.25    Types: Cigarettes  . Smokeless tobacco: Never Used  Substance and Sexual Activity  . Alcohol use: No  . Drug use: Never  . Sexual activity: Yes  Other Topics Concern  . Not on file  Social History Narrative  . Not on file   Social Determinants of Health   Financial Resource Strain:   . Difficulty of Paying Living Expenses: Not on file  Food Insecurity:   . Worried About Programme researcher, broadcasting/film/video in the Last Year: Not on file  . Ran Out of Food in the Last Year: Not on file  Transportation Needs:   . Lack of Transportation (Medical): Not on file  . Lack of Transportation (Non-Medical): Not on file  Physical Activity:   . Days of Exercise per Week: Not on file  . Minutes of Exercise per Session: Not on file  Stress:   . Feeling of Stress : Not on file  Social Connections:   . Frequency of Communication with Friends and Family: Not on file  . Frequency of Social Gatherings with Friends and Family: Not on file  . Attends Religious Services: Not on file  . Active Member of Clubs or Organizations: Not on file  . Attends Banker Meetings: Not on file  . Marital Status: Not on file    Allergies:  Allergies  Allergen Reactions  . Eggs Or Egg-Derived Products Diarrhea    Metabolic Disorder Labs: No results found for: HGBA1C, MPG No results found for: PROLACTIN Lab Results  Component Value Date   CHOL 241 (H) 08/02/2019   TRIG 286 (H) 08/02/2019   HDL 42 08/02/2019   LDLCALC 147 (H) 08/02/2019   Lab Results  Component Value Date   TSH 1.180 08/02/2019   TSH 2.52 11/19/2012    Therapeutic Level Labs: No results found for: LITHIUM No results found for: VALPROATE No components found for:  CBMZ  Current  Medications: Current Outpatient Medications  Medication Sig Dispense Refill  . albuterol (VENTOLIN HFA) 108 (90 Base) MCG/ACT inhaler Inhale 2 puffs into the lungs every 6 (six) hours as needed for wheezing or shortness of breath. 8 g 1  . benzonatate (TESSALON) 200 MG capsule Take 1 capsule (200 mg total) by mouth 2 (two) times daily as needed for cough. 40 capsule 0  . cetirizine (ZYRTEC ALLERGY) 10 MG tablet Take 10 mg by mouth daily.    . cyclobenzaprine (FLEXERIL) 10 MG tablet Take 1 tablet (10 mg total) by mouth at bedtime. 30 tablet 2  . diphenhydramine-acetaminophen (TYLENOL PM) 25-500 MG TABS tablet Take 1 tablet by mouth at bedtime as needed.    . etonogestrel-ethinyl  estradiol (NUVARING) 0.12-0.015 MG/24HR vaginal ring Place vaginally.    Marland Kitchen. guaiFENesin-codeine 100-10 MG/5ML syrup Take 5 mLs by mouth 2 (two) times daily as needed for cough. 120 mL 0  . hydrOXYzine (ATARAX/VISTARIL) 25 MG tablet Take 1-2 tablets (25-50 mg total) by mouth at bedtime as needed. FOR SLEEP 60 tablet 1  . lamoTRIgine (LAMICTAL) 200 MG tablet Take 1 tablet (200 mg total) by mouth daily. 90 tablet 0  . lamoTRIgine (LAMICTAL) 25 MG tablet Take 2 tablets (50 mg total) by mouth daily. To be combined with 200 mg daily at bedtime 60 tablet 1  . lidocaine (XYLOCAINE) 2 % jelly Apply topically.     Marland Kitchen. MELATONIN PO Take by mouth.    Marland Kitchen. omeprazole (PRILOSEC) 20 MG capsule Take 20 mg by mouth daily.    . predniSONE (DELTASONE) 20 MG tablet Take 2 tablets (40 mg total) by mouth daily with breakfast for 5 days. 10 tablet 0  . venlafaxine XR (EFFEXOR XR) 75 MG 24 hr capsule Take 1 capsule (75 mg total) by mouth daily with breakfast. 30 capsule 1  . Vitamin D, Ergocalciferol, (DRISDOL) 1.25 MG (50000 UNIT) CAPS capsule Take 50,000 Units by mouth once a week.    . zolpidem (AMBIEN) 5 MG tablet Take 2 tablets (10 mg total) by mouth at bedtime as needed for sleep. 30 tablet 0   No current facility-administered medications for  this visit.     Musculoskeletal: Strength & Muscle Tone: UTA Gait & Station: Seated Patient leans: N/A  Psychiatric Specialty Exam: Review of Systems  Constitutional: Positive for fatigue and fever.  HENT: Positive for congestion and sore throat.   Respiratory: Positive for cough.   Musculoskeletal: Positive for myalgias.  Psychiatric/Behavioral: Positive for dysphoric mood and sleep disturbance. The patient is nervous/anxious.     There were no vitals taken for this visit.There is no height or weight on file to calculate BMI.  General Appearance: Casual  Eye Contact:  Fair  Speech:  Clear and Coherent  Volume:  Normal  Mood:  Anxious and Dysphoric  Affect:  Congruent  Thought Process:  Goal Directed and Descriptions of Associations: Intact  Orientation:  Full (Time, Place, and Person)  Thought Content: Logical   Suicidal Thoughts:  No  Homicidal Thoughts:  No  Memory:  Immediate;   Fair Recent;   Fair Remote;   Fair  Judgement:  Fair  Insight:  Fair  Psychomotor Activity:  Normal  Concentration:  Concentration: Fair and Attention Span: Fair  Recall:  FiservFair  Fund of Knowledge: Fair  Language: Fair  Akathisia:  No  Handed:  Right  AIMS (if indicated): UTA  Assets:  Communication Skills Desire for Improvement Housing Social Support  ADL's:  Intact  Cognition: WNL  Sleep:  Poor   Screenings: GAD-7     [facility]  Total GAD-7 Score 10    PHQ2-9     Office Visit [facility] 08/02/2019 in [facility]  PHQ-2 Total Score 2  PHQ-9 Total Score 11       Assessment and Plan: [name] is a 29 year old Caucasian female, married, employed, has a history of bipolar disorder, sleep disorder, ADHD, PTSD,DUB was evaluated by telemedicine today.  Patient is biologically predisposed given her history of trauma, her own health problems, family history.  Patient with multiple psychosocial stressors including job  related stressors, relationship struggles, medical problems.  Patient continues to struggle with mood symptoms and sleep problems  and will benefit from medication readjustment.  Plan Bipolar disorder type I depressed -unstable Increase Lamictal to 250 mg p.o. daily. Venlafaxine 75 mg p.o. daily Advised patient to take Ambien 10 mg p.o. nightly and she will let writer know if she is not doing well on the same. We will consider starting Lunesta. Patient to get sleep study done-pending  PTSD-unstable Continue CBT. Venlafaxine 75 mg p.o. daily Hydroxyzine 25 to 50 mg p.o. nightly as needed Increase Ambien to 10 mg p.o. nightly  Social anxiety disorder-some progress Venlafaxine extended release 75 mg p.o. daily Continue CBT  History of ADHD-unstable Pending medical records from [facility]. Venlafaxine will help with ADHD symptoms.  Tobacco use disorder-improving Provided counseling  Follow-up in clinic in 4 weeks or sooner if needed.  I have spent atleast 20 minutes face to face by video with patient today. More than 50 % of the time was spent for preparing to see the patient ( e.g., review of test, records ), ordering medications and test ,psychoeducation and supportive psychotherapy and care coordination,as well as documenting clinical information in electronic health record. This note was generated in part or whole with voice recognition software. Voice recognition is usually quite accurate but there are transcription errors that can and very often do occur. I apologize for any typographical errors that were not detected and corrected.        Jomarie Longs, MD 12/22/2019, 8:14 AM

## 2019-12-22 LAB — NOVEL CORONAVIRUS, NAA: SARS-CoV-2, NAA: NOT DETECTED

## 2019-12-22 LAB — SARS-COV-2, NAA 2 DAY TAT

## 2019-12-23 ENCOUNTER — Other Ambulatory Visit: Payer: Self-pay | Admitting: Nurse Practitioner

## 2019-12-23 MED ORDER — AMOXICILLIN-POT CLAVULANATE 875-125 MG PO TABS: 1.0000 | ORAL_TABLET | Freq: Two times a day (BID) | ORAL | 0 refills | Status: AC

## 2020-01-16 ENCOUNTER — Telehealth: Payer: Self-pay

## 2020-01-16 NOTE — Telephone Encounter (Signed)
Returned call to patient, left voicemail. 

## 2020-01-16 NOTE — Telephone Encounter (Signed)
Dr. Eappen's patient

## 2020-01-16 NOTE — Telephone Encounter (Signed)
pt called left message that she needed to speak with you about the Remus Loffler she states that she is having bad dreams and she want to speak with yu abou making changes

## 2020-01-18 ENCOUNTER — Encounter: Payer: Self-pay | Admitting: Licensed Clinical Social Worker

## 2020-01-18 ENCOUNTER — Other Ambulatory Visit: Payer: Self-pay

## 2020-01-18 ENCOUNTER — Ambulatory Visit (INDEPENDENT_AMBULATORY_CARE_PROVIDER_SITE_OTHER): Payer: BC Managed Care – PPO | Admitting: Licensed Clinical Social Worker

## 2020-01-18 DIAGNOSIS — F4310 Post-traumatic stress disorder, unspecified: Secondary | ICD-10-CM | POA: Diagnosis not present

## 2020-01-18 DIAGNOSIS — F3131 Bipolar disorder, current episode depressed, mild: Secondary | ICD-10-CM | POA: Diagnosis not present

## 2020-01-18 DIAGNOSIS — F4010 Social phobia, unspecified: Secondary | ICD-10-CM | POA: Diagnosis not present

## 2020-01-18 NOTE — Progress Notes (Signed)
Virtual Visit via Video Note  I connected with Scurry on 01/18/20 at  3:00 PM EST by a video enabled telemedicine application and verified that I am speaking with the correct person using two identifiers.  Participating Parties Patient Provider  Location: Patient: Home  Provider: Home Office   I discussed the limitations of evaluation and management by telemedicine and the availability of in person appointments. The patient expressed understanding and agreed to proceed.  THERAPY PROGRESS NOTE  Session Time: 56 Minutes  Participation Level: Active  Behavioral Response: Casual and Well GroomedAlertAnxious  Type of Therapy: Individual Therapy  Treatment Goals addressed: Anxiety and Coping  Interventions: CBT  Summary: Mariah Gonzalez is a 29 y.o. female who presents with depression and anxiety sxs. Pt reported medication helping with sleep, however is experiencing "weird, bad and lucid dreams". Pt reported without the medication she cannot get restful sleep and has tried sleep hygiene. Pt identified stressors around work and its impact on relationship with spouse. Pt acknowledged the importance of setting boundaries, limits and expectations. Pt reported desire to have children and considered adoption/legal guardianship. Pt reported she believes she may not be able to have her own children and beginning to have mixed feelings about this. Pt reported she may have had a miscarriage in the past at age 14, but never sought medical treatment.  Suicidal/Homicidal: No  Therapist Response: Therapist met with patient for follow up. Therapist and patient explored current stressors and attempts to cope. Therapist and patient processed thoughts and feelings. Therapist validated patient thoughts/concerns.  Plan: Return again in 3 weeks.  Diagnosis: Axis I: Bipolar, Depressed, Post Traumatic Stress Disorder and Social Anxiety    Axis II: N/A  [name], LCSW,  LCAS 01/18/2020

## 2020-01-24 ENCOUNTER — Other Ambulatory Visit: Payer: Self-pay

## 2020-01-24 ENCOUNTER — Encounter: Payer: Self-pay | Admitting: Psychiatry

## 2020-01-24 ENCOUNTER — Telehealth (INDEPENDENT_AMBULATORY_CARE_PROVIDER_SITE_OTHER): Payer: BC Managed Care – PPO | Admitting: Psychiatry

## 2020-01-24 DIAGNOSIS — F17200 Nicotine dependence, unspecified, uncomplicated: Secondary | ICD-10-CM

## 2020-01-24 DIAGNOSIS — F4010 Social phobia, unspecified: Secondary | ICD-10-CM

## 2020-01-24 DIAGNOSIS — F3131 Bipolar disorder, current episode depressed, mild: Secondary | ICD-10-CM | POA: Diagnosis not present

## 2020-01-24 DIAGNOSIS — Z8659 Personal history of other mental and behavioral disorders: Secondary | ICD-10-CM

## 2020-01-24 DIAGNOSIS — F4310 Post-traumatic stress disorder, unspecified: Secondary | ICD-10-CM

## 2020-01-24 MED ORDER — LAMOTRIGINE 25 MG PO TABS: 50.0000 mg | ORAL_TABLET | Freq: Every day | ORAL | 0 refills | Status: DC

## 2020-01-24 MED ORDER — ESZOPICLONE 1 MG PO TABS: 1.0000 mg | ORAL_TABLET | Freq: Every evening | ORAL | 1 refills | Status: DC | PRN

## 2020-01-24 MED ORDER — LAMOTRIGINE 200 MG PO TABS: 200.0000 mg | ORAL_TABLET | Freq: Every day | ORAL | 0 refills | Status: DC

## 2020-01-24 MED ORDER — VENLAFAXINE HCL ER 75 MG PO CP24: 75.0000 mg | ORAL_CAPSULE | Freq: Every day | ORAL | 0 refills | Status: DC

## 2020-01-24 NOTE — Patient Instructions (Signed)
Eszopiclone tablets What is this medicine? ESZOPICLONE (es ZOE pi clone) is used to treat insomnia. This medicine helps you to fall asleep and sleep through the night. This medicine may be used for other purposes; ask your health care provider or pharmacist if you have questions. COMMON BRAND NAME(S): Lunesta What should I tell my health care provider before I take this medicine? They need to know if you have any of these conditions:  depression  history of a drug or alcohol abuse problem  liver disease  lung or breathing disease  sleep-walking, driving, eating or other activity while not fully awake after taking a sleep medicine  suicidal thoughts  an unusual or allergic reaction to eszopiclone, other medicines, foods, dyes, or preservatives  pregnant or trying to get pregnant  breast-feeding How should I use this medicine? Take this medicine by mouth with a glass of water. Follow the directions on the prescription label. It is better to take this medicine on an empty stomach and only when you are ready for bed. Do not take your medicine more often than directed. If you have been taking this medicine for several weeks and suddenly stop taking it, you may get unpleasant withdrawal symptoms. Your doctor or health care professional may want to gradually reduce the dose. Do not stop taking this medicine on your own. Always follow your doctor or health care professional's advice. Talk to your pediatrician regarding the use of this medicine in children. Special care may be needed. Overdosage: If you think you have taken too much of this medicine contact a poison control center or emergency room at once. NOTE: This medicine is only for you. Do not share this medicine with others. What if I miss a dose? This does not apply. This medicine should only be taken immediately before going to sleep. Do not take double or extra doses. What may interact with this medicine?  herbal medicines like  kava kava, melatonin, St. John's wort and valerian  lorazepam  medicines for fungal infections like ketoconazole, fluconazole, or itraconazole  olanzapine This list may not describe all possible interactions. Give your health care provider a list of all the medicines, herbs, non-prescription drugs, or dietary supplements you use. Also tell them if you smoke, drink alcohol, or use illegal drugs. Some items may interact with your medicine. What should I watch for while using this medicine? Visit your doctor or health care professional for regular checks on your progress. Keep a regular sleep schedule by going to bed at about the same time nightly. Avoid caffeine-containing drinks in the evening hours, as caffeine can cause trouble with falling asleep. Talk to your doctor if you still have trouble sleeping. After taking this medicine, you may get up out of bed and do an activity that you do not know you are doing. The next morning, you may have no memory of this. Activities include driving a car ("sleep-driving"), making and eating food, talking on the phone, sexual activity, and sleep-walking. Serious injuries have occurred. Stop the medicine and call your doctor right away if you find out you have done any of these activities. Do not take this medicine if you have used alcohol that evening. Do not take it if you have taken another medicine for sleep. The risk of doing these sleep-related activities is higher. Do not take this medicine unless you are able to stay in bed for a full night (7 to 8 hours) before you must be active again. You may have a   decrease in mental alertness the day after use, even if you feel that you are fully awake. Tell your doctor if you will need to perform activities requiring full alertness, such as driving, the next day. Do not stand or sit up quickly after taking this medicine, especially if you are an older patient. This reduces the risk of dizzy or fainting spells. If you or  your family notice any changes in your behavior, such as new or worsening depression, thoughts of harming yourself, anxiety, other unusual or disturbing thoughts, or memory loss, call your doctor right away. After you stop taking this medicine, you may have trouble falling asleep. This is called rebound insomnia. This problem usually goes away on its own after 1 or 2 nights. What side effects may I notice from receiving this medicine? Side effects that you should report to your doctor or health care professional as soon as possible:  allergic reactions like skin rash, itching or hives, swelling of the face, lips, or tongue  changes in vision  confusion  depressed mood  feeling faint or lightheaded, falls  hallucinations  problems with balance, speaking, walking  restlessness, excitability, or feelings of agitation  unusual activities while not fully awake like driving, eating, making phone calls Side effects that usually do not require medical attention (report to your doctor or health care professional if they continue or are bothersome):  dizziness, or daytime drowsiness, sometimes called a hangover effect  headache This list may not describe all possible side effects. Call your doctor for medical advice about side effects. You may report side effects to FDA at 1-800-FDA-1088. Where should I keep my medicine? Keep out of the reach of children. This medicine can be abused. Keep your medicine in a safe place to protect it from theft. Do not share this medicine with anyone. Selling or giving away this medicine is dangerous and against the law. This medicine may cause accidental overdose and death if taken by other adults, children, or pets. Mix any unused medicine with a substance like cat litter or coffee grounds. Then throw the medicine away in a sealed container like a sealed bag or a coffee can with a lid. Do not use the medicine after the expiration date. Store at room temperature  between 15 and 30 degrees C (59 and 86 degrees F). NOTE: This sheet is a summary. It may not cover all possible information. If you have questions about this medicine, talk to your doctor, pharmacist, or health care provider.  2020 Elsevier/Gold Standard (2017-07-24 11:57:05)  

## 2020-01-24 NOTE — Progress Notes (Signed)
Virtual Visit via Video Note  I connected with Mariah Gonzalez on 01/24/20 at  3:30 PM EST by a video enabled telemedicine application and verified that I am speaking with the correct person using two identifiers.  Location Provider Location : [facility] Patient Location : Home  Participants: Patient , Provider    I discussed the limitations of evaluation and management by telemedicine and the availability of in person appointments. The patient expressed understanding and agreed to proceed.   I discussed the assessment and treatment plan with the patient. The patient was provided an opportunity to ask questions and all were answered. The patient agreed with the plan and demonstrated an understanding of the instructions.   The patient was advised to call back or seek an in-person evaluation if the symptoms worsen or if the condition fails to improve as anticipated.   BH MD OP Progress Note  01/24/2020 5:36 PM Mariah Gonzalez  MRN:  [id number]  Chief Complaint:  Chief Complaint    Follow-up     HPI: Mariah Gonzalez is a 29 year old Caucasian female, employed, lives in [place], has a history of bipolar disorder, PTSD, ADHD, dysfunctional uterine bleeding, sleep problems was evaluated by telemedicine today.  Patient today reports she is currently struggling with a lot of job related stressors.  She reports she looks forward to the upcoming holiday break.  She reports the Lamictal is helpful with her mood.  She is currently taking Lamictal 250 mg daily.  She denies any side effects on Lamictal.  She is also compliant on the venlafaxine and denies side effects.  She however reports she ran out of Ambien.  The Ambien also gave her a lot of vivid dreams.  She was not happy with the effect of Ambien.  She is interested in trying another sleep aid.  Discussed Lunesta.  She continues to be in therapy with Ms. Juanito DoomLauren Oreilly.  She denies any suicidality, homicidality or perceptual  disturbances.  Patient continues to struggle with uterine bleeding and has upcoming appointment with her OB/GYN.  She is trying to cut back on smoking however she has good days and bad days.  Visit Diagnosis:    ICD-10-CM   1. Bipolar 1 disorder, depressed, mild (HCC)  F31.31 lamoTRIgine (LAMICTAL) 25 MG tablet    lamoTRIgine (LAMICTAL) 200 MG tablet    venlafaxine XR (EFFEXOR XR) 75 MG 24 hr capsule  2. PTSD (post-traumatic stress disorder)  F43.10 venlafaxine XR (EFFEXOR XR) 75 MG 24 hr capsule    eszopiclone (LUNESTA) 1 MG TABS tablet  3. Social anxiety disorder  F40.10   4. History of ADHD  Z86.59   5. Tobacco use disorder  F17.200     Past Psychiatric History: I have reviewed past psychiatric history from my progress note on 10/11/2019.  Past trials of Lamictal, Celexa,Adzenys XR  Past Medical History:  Past Medical History:  Diagnosis Date  . ADHD   . Anxiety   . Asthma   . Depressed   . DUB (dysfunctional uterine bleeding)   . IBS (irritable bowel syndrome)    History reviewed. No pertinent surgical history.  Family Psychiatric History: I have reviewed family psychiatric history from my progress note on 10/11/2019  Family History:  Family History  Problem Relation Age of Onset  . Hyperlipidemia Mother   . Hypertension Mother   . Depression Mother   . Hyperlipidemia Father   . Hypertension Father   . Mental illness Father   . Cancer Maternal Aunt   .  Cancer Maternal Uncle   . Cancer Paternal Aunt   . Hearing loss Paternal Aunt   . Cancer Paternal Uncle   . Hearing loss Paternal Uncle     Social History: Reviewed social history from my progress note on 10/11/2019 he has his Social History   Socioeconomic History  . Marital status: Married    Spouse name: Not on file  . Number of children: Not on file  . Years of education: Not on file  . Highest education level: Not on file  Occupational History  . Not on file  Tobacco Use  . Smoking status: Current  Every Day Smoker    Packs/day: 0.25    Types: Cigarettes  . Smokeless tobacco: Never Used  Substance and Sexual Activity  . Alcohol use: No  . Drug use: Never  . Sexual activity: Yes  Other Topics Concern  . Not on file  Social History Narrative  . Not on file   Social Determinants of Health   Financial Resource Strain: Not on file  Food Insecurity: Not on file  Transportation Needs: Not on file  Physical Activity: Not on file  Stress: Not on file  Social Connections: Not on file    Allergies:  Allergies  Allergen Reactions  . Eggs Or Egg-Derived Products Diarrhea    Metabolic Disorder Labs: No results found for: HGBA1C, MPG No results found for: PROLACTIN Lab Results  Component Value Date   CHOL 241 (H) 08/02/2019   TRIG 286 (H) 08/02/2019   HDL 42 08/02/2019   LDLCALC 147 (H) 08/02/2019   Lab Results  Component Value Date   TSH 1.180 08/02/2019   TSH 2.52 11/19/2012    Therapeutic Level Labs: No results found for: LITHIUM No results found for: VALPROATE No components found for:  CBMZ  Current Medications: Current Outpatient Medications  Medication Sig Dispense Refill  . norgestimate-ethinyl estradiol (ORTHO-CYCLEN) 0.25-35 MG-MCG tablet Take tab 3x a day for 3 days then 2x a day for 3 days, then once daily until complete.    Marland Kitchen albuterol (VENTOLIN HFA) 108 (90 Base) MCG/ACT inhaler Inhale 2 puffs into the lungs every 6 (six) hours as needed for wheezing or shortness of breath. 8 g 1  . benzonatate (TESSALON) 200 MG capsule Take 1 capsule (200 mg total) by mouth 2 (two) times daily as needed for cough. 40 capsule 0  . cetirizine (ZYRTEC ALLERGY) 10 MG tablet Take 10 mg by mouth daily.    . cyclobenzaprine (FLEXERIL) 10 MG tablet Take 1 tablet (10 mg total) by mouth at bedtime. 30 tablet 2  . diphenhydramine-acetaminophen (TYLENOL PM) 25-500 MG TABS tablet Take 1 tablet by mouth at bedtime as needed.    Marland Kitchen ESTARYLLA 0.25-35 MG-MCG tablet Take by mouth.    .  eszopiclone (LUNESTA) 1 MG TABS tablet Take 1-2 tablets (1-2 mg total) by mouth at bedtime as needed for sleep. Take immediately before bedtime 30 tablet 1  . etonogestrel-ethinyl estradiol (NUVARING) 0.12-0.015 MG/24HR vaginal ring Place vaginally.    Marland Kitchen guaiFENesin-codeine 100-10 MG/5ML syrup Take 5 mLs by mouth 2 (two) times daily as needed for cough. 120 mL 0  . hydrOXYzine (ATARAX/VISTARIL) 25 MG tablet Take 1-2 tablets (25-50 mg total) by mouth at bedtime as needed. FOR SLEEP 60 tablet 1  . lamoTRIgine (LAMICTAL) 200 MG tablet Take 1 tablet (200 mg total) by mouth daily. 90 tablet 0  . lamoTRIgine (LAMICTAL) 25 MG tablet Take 2 tablets (50 mg total) by mouth daily. To be  combined with 200 mg daily at bedtime 180 tablet 0  . lidocaine (XYLOCAINE) 2 % jelly Apply topically.     Marland Kitchen MELATONIN PO Take by mouth.    Marland Kitchen omeprazole (PRILOSEC) 20 MG capsule Take 20 mg by mouth daily.    Marland Kitchen venlafaxine XR (EFFEXOR XR) 75 MG 24 hr capsule Take 1 capsule (75 mg total) by mouth daily with breakfast. 90 capsule 0  . Vitamin D, Ergocalciferol, (DRISDOL) 1.25 MG (50000 UNIT) CAPS capsule Take 50,000 Units by mouth once a week.     No current facility-administered medications for this visit.     Musculoskeletal: Strength & Muscle Tone: UTA Gait & Station: UTA Patient leans: N/A  Psychiatric Specialty Exam: Review of Systems  Psychiatric/Behavioral: Positive for sleep disturbance. The patient is nervous/anxious.   All other systems reviewed and are negative.   There were no vitals taken for this visit.There is no height or weight on file to calculate BMI.  General Appearance: Casual  Eye Contact:  Minimal, Patient wearing sunglasses  Speech:  Clear and Coherent  Volume:  Normal  Mood:  Anxious  Affect:  Congruent  Thought Process:  Goal Directed and Descriptions of Associations: Intact  Orientation:  Full (Time, Place, and Person)  Thought Content: Logical   Suicidal Thoughts:  No  Homicidal  Thoughts:  No  Memory:  Immediate;   Fair Recent;   Fair Remote;   Fair  Judgement:  Fair  Insight:  Fair  Psychomotor Activity:  Normal  Concentration:  Concentration: Fair and Attention Span: Fair  Recall:  [employer] of Knowledge: Fair  Language: Fair  Akathisia:  No  Handed:  Right  AIMS (if indicated): UTA  Assets:  Communication Skills Desire for Improvement Housing Social Support  ADL's:  Intact  Cognition: WNL  Sleep:  Poor   Screenings: GAD-7   Flowsheet Row Office Visit from 08/02/2019 in [facility]  Total GAD-7 Score 10    PHQ2-9   Flowsheet Row Office Visit from 08/02/2019 in [facility]  PHQ-2 Total Score 2  PHQ-9 Total Score 11       Assessment and Plan: ALBERTINE LAFOY is a 29 year old Caucasian female, married, employed, has a history of bipolar disorder, sleep disorder, ADHD, PTSD, DUB was evaluated by telemedicine today.  Patient is biologically predisposed given her history of trauma, her own health problems, family history.  Patient with multiple psychosocial stressors including job related stressors, relationship struggles, medical problems.  Patient continues to struggle with sleep problems.  Plan as noted below.  Plan Bipolar disorder type I depressed-improving Lamictal 250 mg p.o. daily Venlafaxine 75 mg p.o. daily   PTSD-unstable Venlafaxine as prescribed Discontinue Ambien for side effects. Start Lunesta 1 to 2 mg p.o. nightly as needed Continue CBT with Ms. Juanito Doom  Social anxiety disorder-some progress Venlafaxine extended release 75 mg p.o. daily Continue CBT  Tobacco use disorder-improving Provided counseling  Follow-up in clinic in 3 to 4 weeks or sooner if needed.  I have spent atleast 20 minutes face to face by video with patient today. More than 50 % of the time was spent for preparing to see the patient ( e.g., review of test, records ),  ordering medications and test ,psychoeducation and  supportive psychotherapy and care coordination,as well as documenting clinical information in electronic health record. This note was generated in part or whole with voice recognition software. Voice recognition is usually quite accurate but there are transcription errors that can  and very often do occur. I apologize for any typographical errors that were not detected and corrected.      Mariah Longs, MD 01/25/2020, 10:21 AM

## 2020-01-27 ENCOUNTER — Telehealth: Payer: Self-pay

## 2020-01-27 NOTE — Telephone Encounter (Signed)
Medication management - Prior authorization form for a formulary exeception received and to be completed.

## 2020-01-27 NOTE — Telephone Encounter (Signed)
Done

## 2020-01-27 NOTE — Telephone Encounter (Signed)
Medication management - Faxed formulary exemption prior authorization back to [employer] [employer], completed by Dr. Elna Breslow for patient's prescribed Lunesta. Decision pending.

## 2020-01-31 ENCOUNTER — Ambulatory Visit (INDEPENDENT_AMBULATORY_CARE_PROVIDER_SITE_OTHER): Payer: BC Managed Care – PPO | Admitting: Family Medicine

## 2020-01-31 ENCOUNTER — Other Ambulatory Visit: Payer: Self-pay

## 2020-01-31 ENCOUNTER — Encounter: Payer: Self-pay | Admitting: Family Medicine

## 2020-01-31 VITALS — BP 139/101 | HR 100 | Temp 98.8°F | Ht 61.5 in | Wt 224.2 lb

## 2020-01-31 DIAGNOSIS — G43009 Migraine without aura, not intractable, without status migrainosus: Secondary | ICD-10-CM

## 2020-01-31 DIAGNOSIS — N938 Other specified abnormal uterine and vaginal bleeding: Secondary | ICD-10-CM

## 2020-01-31 DIAGNOSIS — Z0000 Encounter for general adult medical examination without abnormal findings: Secondary | ICD-10-CM

## 2020-01-31 DIAGNOSIS — Z13220 Encounter for screening for lipoid disorders: Secondary | ICD-10-CM | POA: Diagnosis not present

## 2020-01-31 DIAGNOSIS — Z23 Encounter for immunization: Secondary | ICD-10-CM

## 2020-01-31 DIAGNOSIS — F3162 Bipolar disorder, current episode mixed, moderate: Secondary | ICD-10-CM | POA: Diagnosis not present

## 2020-01-31 DIAGNOSIS — S0340XA Sprain of jaw, unspecified side, initial encounter: Secondary | ICD-10-CM

## 2020-01-31 DIAGNOSIS — F5101 Primary insomnia: Secondary | ICD-10-CM

## 2020-01-31 MED ORDER — BACLOFEN 10 MG PO TABS
5.0000 mg | ORAL_TABLET | Freq: Three times a day (TID) | ORAL | 0 refills | Status: DC
Start: 2020-01-31 — End: 2020-08-03

## 2020-01-31 MED ORDER — TRAZODONE HCL 100 MG PO TABS: 100.0000 mg | ORAL_TABLET | Freq: Every day | ORAL | 1 refills | Status: DC

## 2020-01-31 NOTE — Progress Notes (Signed)
BP (!) 139/101    Pulse 100    Temp 98.8 F (37.1 C)    Ht 5' 1.5" (1.562 m)    Wt 224 lb 3.2 oz (101.7 kg)    SpO2 98%    BMI 41.68 kg/m    Subjective:    Patient ID: Mariah Gonzalez, female    DOB: [birth year], 29 y.o.   MRN: [id number]  HPI: Mariah Gonzalez is a 29 y.o. female presenting on 01/31/2020 for comprehensive medical examination. Current medical complaints include:  Has been having some increased headaches and having issues with pain in her jaw and ear. She has been having some fluttering and ringing in her ear. She saw the audiologist at the school and she did an audiogram and it seemed normal. 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 She has been having some pain with chewing and has been having pain. Has been grinding and clenching at night. She has not seen a dentist in years. She had a herps appliance in the past. She notes that the headaches have been keeping her out of work a few times. She notes that she has had a migraine every day for the past 2 weeks.   INSOMNIA- has been working with psychiatry. Last saw them last week and was changed to lunesta but she has not been able to get it due to PA issues. She took a trazodone and it really helped. She would like some of that in the mean time Duration: chronic Satisfied with sleep quality: no Difficulty falling asleep: yes Difficulty staying asleep: yes Waking a few hours after sleep onset: no Early morning awakenings: no Daytime hypersomnolence: yes Wakes feeling refreshed: no Good sleep hygiene: yes Apnea: no Snoring: yes Depressed/anxious mood: yes Recent stress: yes Restless legs/nocturnal leg cramps: no Chronic pain/arthritis: no History of sleep study: no Treatments attempted: melatonin, uinsom, benadryl and ambien, lunesta   Has been having issues with her periods with bleeding for up to 74 days. She has been working with the GYN on trying to get it under control. She has been on  a lot of hormones and they seem to be effecting her head and her breasts and her sex drive.   She currently lives with: wife Menopausal Symptoms: no  Depression Screen done today and results listed below:  Depression screen [facility] 2/9 01/31/2020 08/02/2019  Decreased Interest 3 1  Down, Depressed, Hopeless 3 1  PHQ - 2 Score 6 2  Altered sleeping 3 2  Tired, decreased energy 3 2  Change in appetite 3 1  Feeling bad or failure about yourself  1 1  Trouble concentrating 0 1  Moving slowly or fidgety/restless 0 2  Suicidal thoughts 0 0  PHQ-9 Score 16 11  Difficult doing work/chores Somewhat difficult Somewhat difficult   Past Medical History:  Past Medical History:  Diagnosis Date   ADHD    Anxiety    Asthma    Depressed    DUB (dysfunctional uterine bleeding)    IBS (irritable bowel syndrome)     Surgical History:  History reviewed. No pertinent surgical history.  Medications:  Current Outpatient Medications on File Prior to Visit  Medication Sig   albuterol (VENTOLIN HFA) 108 (90 Base) MCG/ACT inhaler Inhale 2 puffs into the lungs every 6 (six) hours as needed for wheezing or shortness of breath.   cetirizine (ZYRTEC) 10 MG tablet Take 10 mg by mouth daily.   cyclobenzaprine (FLEXERIL)  10 MG tablet Take 1 tablet (10 mg total) by mouth at bedtime.   diphenhydramine-acetaminophen (TYLENOL PM) 25-500 MG TABS tablet Take 1 tablet by mouth at bedtime as needed.   etonogestrel-ethinyl estradiol (NUVARING) 0.12-0.015 MG/24HR vaginal ring Place vaginally.   lamoTRIgine (LAMICTAL) 200 MG tablet Take 1 tablet (200 mg total) by mouth daily.   lamoTRIgine (LAMICTAL) 25 MG tablet Take 2 tablets (50 mg total) by mouth daily. To be combined with 200 mg daily at bedtime   lidocaine (XYLOCAINE) 2 % jelly Apply topically.    MELATONIN PO Take by mouth.   omeprazole (PRILOSEC) 20 MG capsule Take 20 mg by mouth daily.   venlafaxine XR (EFFEXOR XR) 75 MG 24 hr capsule Take 1  capsule (75 mg total) by mouth daily with breakfast.   Vitamin D, Ergocalciferol, (DRISDOL) 1.25 MG (50000 UNIT) CAPS capsule Take 50,000 Units by mouth once a week.   eszopiclone (LUNESTA) 1 MG TABS tablet Take 1-2 tablets (1-2 mg total) by mouth at bedtime as needed for sleep. Take immediately before bedtime (Patient not taking: Reported on 01/31/2020)   No current facility-administered medications on file prior to visit.    Allergies:  Allergies  Allergen Reactions   Eggs Or Egg-Derived Products Diarrhea    Social History:  Social History   Socioeconomic History   Marital status: Married    Spouse name: Not on file   Number of children: Not on file   Years of education: Not on file   Highest education level: Not on file  Occupational History   Not on file  Tobacco Use   Smoking status: Current Every Day Smoker    Packs/day: 0.25    Types: Cigarettes   Smokeless tobacco: Never Used  Vaping Use   Vaping Use: Never used  Substance and Sexual Activity   Alcohol use: No   Drug use: Never   Sexual activity: Yes  Other Topics Concern   Not on file  Social History Narrative   Not on file   Social Determinants of Health   Financial Resource Strain: Not on file  Food Insecurity: Not on file  Transportation Needs: Not on file  Physical Activity: Not on file  Stress: Not on file  Social Connections: Not on file  Intimate Partner Violence: Not on file   Social History   Tobacco Use  Smoking Status Current Every Day Smoker   Packs/day: 0.25   Types: Cigarettes  Smokeless Tobacco Never Used   Social History   Substance and Sexual Activity  Alcohol Use No    Family History:  Family History  Problem Relation Age of Onset   Hyperlipidemia Mother    Hypertension Mother    Depression Mother    Hyperlipidemia Father    Hypertension Father    Mental illness Father    Cancer Maternal Aunt    Cancer Maternal Uncle    Cancer Paternal  Aunt    Hearing loss Paternal Aunt    Cancer Paternal Uncle    Hearing loss Paternal Uncle     Past medical history, surgical history, medications, allergies, family history and social history reviewed with patient today and changes made to appropriate areas of the chart.   Review of Systems  Constitutional: Negative.   HENT: Positive for ear pain and tinnitus. Negative for congestion, ear discharge, hearing loss, nosebleeds, sinus pain and sore throat.   Eyes: Negative.   Respiratory: Negative.  Negative for stridor.   Cardiovascular: Negative.   Gastrointestinal: Positive  for heartburn. Negative for abdominal pain, blood in stool, constipation, diarrhea, melena, nausea and vomiting.  Genitourinary: Negative.   Musculoskeletal: Positive for myalgias. Negative for back pain, falls, joint pain and neck pain.  Skin: Negative.   Neurological: Positive for headaches. Negative for dizziness, tingling, tremors, sensory change, speech change, focal weakness, seizures, loss of consciousness and weakness.  Endo/Heme/Allergies: Negative for environmental allergies and polydipsia. Bruises/bleeds easily (chronic).  Psychiatric/Behavioral: Negative for depression, hallucinations, memory loss, substance abuse and suicidal ideas. The patient has insomnia. The patient is not nervous/anxious.     All other ROS negative except what is listed above and in the HPI.      Objective:    BP (!) 139/101    Pulse 100    Temp 98.8 F (37.1 C)    Ht 5' 1.5" (1.562 m)    Wt 224 lb 3.2 oz (101.7 kg)    SpO2 98%    BMI 41.68 kg/m   Wt Readings from Last 3 Encounters:  01/31/20 224 lb 3.2 oz (101.7 kg)  10/13/19 205 lb (93 kg)  08/02/19 196 lb (88.9 kg)    Physical Exam Vitals and nursing note reviewed.  Constitutional:      General: She is not in acute distress.    Appearance: Normal appearance. She is not ill-appearing, toxic-appearing or diaphoretic.  HENT:     Head: Normocephalic and atraumatic.      Right Ear: Tympanic membrane, ear canal and external ear normal. There is no impacted cerumen.     Left Ear: Tympanic membrane, ear canal and external ear normal. There is no impacted cerumen.     Nose: Nose normal. No congestion or rhinorrhea.     Mouth/Throat:     Mouth: Mucous membranes are moist.     Pharynx: Oropharynx is clear. No oropharyngeal exudate or posterior oropharyngeal erythema.  Eyes:     General: No scleral icterus.       Right eye: No discharge.        Left eye: No discharge.     Extraocular Movements: Extraocular movements intact.     Conjunctiva/sclera: Conjunctivae normal.     Pupils: Pupils are equal, round, and reactive to light.  Neck:     Vascular: No carotid bruit.  Cardiovascular:     Rate and Rhythm: Normal rate and regular rhythm.     Pulses: Normal pulses.     Heart sounds: No murmur heard. No friction rub. No gallop.   Pulmonary:     Effort: Pulmonary effort is normal. No respiratory distress.     Breath sounds: Normal breath sounds. No stridor. No wheezing, rhonchi or rales.  Chest:     Chest wall: No tenderness.  Abdominal:     General: Abdomen is flat. Bowel sounds are normal. There is no distension.     Palpations: Abdomen is soft. There is no mass.     Tenderness: There is no abdominal tenderness. There is no right CVA tenderness, left CVA tenderness, guarding or rebound.     Hernia: No hernia is present.  Genitourinary:    Comments: Breast and pelvic exams deferred with shared decision making Musculoskeletal:        General: No swelling, tenderness, deformity or signs of injury.     Cervical back: Normal range of motion and neck supple. No rigidity. No muscular tenderness.     Right lower leg: No edema.     Left lower leg: No edema.  Lymphadenopathy:     Cervical:  No cervical adenopathy.  Skin:    General: Skin is warm and dry.     Capillary Refill: Capillary refill takes less than 2 seconds.     Coloration: Skin is not jaundiced or  pale.     Findings: No bruising, erythema, lesion or rash.  Neurological:     General: No focal deficit present.     Mental Status: She is alert and oriented to person, place, and time. Mental status is at baseline.     Cranial Nerves: No cranial nerve deficit.     Sensory: No sensory deficit.     Motor: No weakness.     Coordination: Coordination normal.     Gait: Gait normal.     Deep Tendon Reflexes: Reflexes normal.  Psychiatric:        Mood and Affect: Mood normal.        Behavior: Behavior normal.        Thought Content: Thought content normal.        Judgment: Judgment normal.     Results for orders placed or performed in visit on 12/19/19  Novel Coronavirus, NAA ([facility])   Specimen: Nasopharyngeal(NP) swabs in vial transport medium  Result Value Ref Range   SARS-CoV-2, NAA Not Detected Not Detected  SARS-COV-2, NAA 2 DAY TAT  Result Value Ref Range   SARS-CoV-2, NAA 2 DAY TAT Performed       Assessment & Plan:   Problem List Items Addressed This Visit      Cardiovascular and Mediastinum   Migraine    Worsening with several triggers at this time. Will work on sleep. Call if not getting better or getting worse. Continue to monitor.       Relevant Medications   traZODone (DESYREL) 100 MG tablet   baclofen (LIORESAL) 10 MG tablet     Genitourinary   DUB (dysfunctional uterine bleeding)    Following with GYN. Continue to monitor. Call with any concerns.         Other   Bipolar 1 disorder, mixed, moderate (HCC)    Follows with psychiatry. Continue to follow with them. Call with any concerns.        Other Visit Diagnoses    Routine general medical examination at a health care facility    -  Primary   Vaccines up to date/declined. Screening labs checked today. Pap up to date. Continue diet and exercise. Call with any concerns.    Relevant Orders   CBC with Differential/Platelet   Comprehensive metabolic panel   Lipid Panel w/o Chol/HDL Ratio   TSH    VITAMIN D 25 Hydroxy (Vit-D Deficiency, Fractures)   Screening for cholesterol level       Labs drawn today. Await results.    Relevant Orders   Lipid Panel w/o Chol/HDL Ratio   Sprain of temporomandibular joint, initial encounter       Will start stretches and baclofen. Call with any concerns or if not improving encouraged evaluation with dentist.    Primary insomnia       Following with psychiatry. Will give trazodone while waiting for PA for lunesta to go through- encouraged her to call psychiatry.        Follow up plan: Return in about 6 months (around 07/31/2020).   LABORATORY TESTING:  - Pap smear: up to date  IMMUNIZATIONS:   - Tdap: Tetanus vaccination status reviewed: last tetanus booster within 10 years. - Influenza: Refused - Pneumovax: Up to date - COVID: Not applicable -  HPV: Administered today  PATIENT COUNSELING:   Advised to take 1 mg of folate supplement per day if capable of pregnancy.   Sexuality: Discussed sexually transmitted diseases, partner selection, use of condoms, avoidance of unintended pregnancy  and contraceptive alternatives.   Advised to avoid cigarette smoking.  I discussed with the patient that most people either abstain from alcohol or drink within safe limits (<=14/week and <=4 drinks/occasion for males, <=7/weeks and <= 3 drinks/occasion for females) and that the risk for alcohol disorders and other health effects rises proportionally with the number of drinks per week and how often a drinker exceeds daily limits.  Discussed cessation/primary prevention of drug use and availability of treatment for abuse.   Diet: Encouraged to adjust caloric intake to maintain  or achieve ideal body weight, to reduce intake of dietary saturated fat and total fat, to limit sodium intake by avoiding high sodium foods and not adding table salt, and to maintain adequate dietary potassium and calcium preferably from fresh fruits, vegetables, and low-fat dairy  products.    stressed the importance of regular exercise  Injury prevention: Discussed safety belts, safety helmets, smoke detector, smoking near bedding or upholstery.   Dental health: Discussed importance of regular tooth brushing, flossing, and dental visits.    NEXT PREVENTATIVE PHYSICAL DUE IN 1 YEAR. Return in about 6 months (around 07/31/2020).

## 2020-01-31 NOTE — Assessment & Plan Note (Signed)
Following with GYN. Continue to monitor. Call with any concerns.  

## 2020-01-31 NOTE — Assessment & Plan Note (Signed)
Worsening with several triggers at this time. Will work on sleep. Call if not getting better or getting worse. Continue to monitor.

## 2020-01-31 NOTE — Assessment & Plan Note (Signed)
Follows with psychiatry. Continue to follow with them. Call with any concerns.  

## 2020-01-31 NOTE — Patient Instructions (Addendum)
Health Maintenance, Female Adopting a healthy lifestyle and getting preventive care are important in promoting health and wellness. Ask your health care provider about:  The right schedule for you to have regular tests and exams.  Things you can do on your own to prevent diseases and keep yourself healthy. What should I know about diet, weight, and exercise? Eat a healthy diet   Eat a diet that includes plenty of vegetables, fruits, low-fat dairy products, and lean protein.  Do not eat a lot of foods that are high in solid fats, added sugars, or sodium. Maintain a healthy weight Body mass index (BMI) is used to identify weight problems. It estimates body fat based on height and weight. Your health care provider can help determine your BMI and help you achieve or maintain a healthy weight. Get regular exercise Get regular exercise. This is one of the most important things you can do for your health. Most adults should:  Exercise for at least 150 minutes each week. The exercise should increase your heart rate and make you sweat (moderate-intensity exercise).  Do strengthening exercises at least twice a week. This is in addition to the moderate-intensity exercise.  Spend less time sitting. Even light physical activity can be beneficial. Watch cholesterol and blood lipids Have your blood tested for lipids and cholesterol at 27 years of age, then have this test every 5 years. Have your cholesterol levels checked more often if:  Your lipid or cholesterol levels are high.  You are older than 27 years of age.  You are at high risk for heart disease. What should I know about cancer screening? Depending on your health history and family history, you may need to have cancer screening at various ages. This may include screening for:  Breast cancer.  Cervical cancer.  Colorectal cancer.  Skin cancer.  Lung cancer. What should I know about heart disease, diabetes, and high blood  pressure? Blood pressure and heart disease  High blood pressure causes heart disease and increases the risk of stroke. This is more likely to develop in people who have high blood pressure readings, are of African descent, or are overweight.  Have your blood pressure checked: ? Every 3-5 years if you are 18-39 years of age. ? Every year if you are 40 years old or older. Diabetes Have regular diabetes screenings. This checks your fasting blood sugar level. Have the screening done:  Once every three years after age 40 if you are at a normal weight and have a low risk for diabetes.  More often and at a younger age if you are overweight or have a high risk for diabetes. What should I know about preventing infection? Hepatitis B If you have a higher risk for hepatitis B, you should be screened for this virus. Talk with your health care provider to find out if you are at risk for hepatitis B infection. Hepatitis C Testing is recommended for:  Everyone born from 1945 through 1965.  Anyone with known risk factors for hepatitis C. Sexually transmitted infections (STIs)  Get screened for STIs, including gonorrhea and chlamydia, if: ? You are sexually active and are younger than 27 years of age. ? You are older than 27 years of age and your health care provider tells you that you are at risk for this type of infection. ? Your sexual activity has changed since you were last screened, and you are at increased risk for chlamydia or gonorrhea. Ask your health care provider if   you are at risk.  Ask your health care provider about whether you are at high risk for HIV. Your health care provider may recommend a prescription medicine to help prevent HIV infection. If you choose to take medicine to prevent HIV, you should first get tested for HIV. You should then be tested every 3 months for as long as you are taking the medicine. Pregnancy  If you are about to stop having your period (premenopausal) and  you may become pregnant, seek counseling before you get pregnant.  Take 400 to 800 micrograms (mcg) of folic acid every day if you become pregnant.  Ask for birth control (contraception) if you want to prevent pregnancy. Osteoporosis and menopause Osteoporosis is a disease in which the bones lose minerals and strength with aging. This can result in bone fractures. If you are 84 years old or older, or if you are at risk for osteoporosis and fractures, ask your health care provider if you should:  Be screened for bone loss.  Take a calcium or vitamin D supplement to lower your risk of fractures.  Be given hormone replacement therapy (HRT) to treat symptoms of menopause. Follow these instructions at home: Lifestyle  Do not use any products that contain nicotine or tobacco, such as cigarettes, e-cigarettes, and chewing tobacco. If you need help quitting, ask your health care provider.  Do not use street drugs.  Do not share needles.  Ask your health care provider for help if you need support or information about quitting drugs. Alcohol use  Do not drink alcohol if: ? Your health care provider tells you not to drink. ? You are pregnant, may be pregnant, or are planning to become pregnant.  If you drink alcohol: ? Limit how much you use to 0-1 drink a day. ? Limit intake if you are breastfeeding.  Be aware of how much alcohol is in your drink. In the [place] one drink equals one 12 oz bottle of beer (355 mL), one 5 oz glass of wine (148 mL), or one 1 oz glass of hard liquor (44 mL). General instructions  Schedule regular health, dental, and eye exams.  Stay current with your vaccines.  Tell your health care provider if: ? You often feel depressed. ? You have ever been abused or do not feel safe at home. Summary  Adopting a healthy lifestyle and getting preventive care are important in promoting health and wellness.  Follow your health care provider's instructions about healthy  diet, exercising, and getting tested or screened for diseases.  Follow your health care provider's instructions on monitoring your cholesterol and blood pressure. This information is not intended to replace advice given to you by your health care provider. Make sure you discuss any questions you have with your health care provider. Document Revised: 01/20/2018 Document Reviewed: 01/20/2018 Elsevier Patient Education  2020 Elsevier Inc. HPV (Human Papillomavirus) Vaccine: What You Need to Know 1. Why get vaccinated? HPV (Human papillomavirus) vaccine can prevent infection with some types of human papillomavirus. HPV infections can cause certain types of cancers including:  cervical, vaginal and vulvar cancers in women,  penile cancer in men, and  anal cancers in both men and women. HPV vaccine prevents infection from the HPV types that cause over 90% of these cancers. HPV is spread through intimate skin-to-skin or sexual contact. HPV infections are so common that nearly all men and women will get at least one type of HPV at some time in their lives. Most HPV infections  go away by themselves within 2 years. But sometimes HPV infections will last longer and can cause cancers later in life. 2. HPV vaccine HPV vaccine is routinely recommended for adolescents at 5 or 27 years of age to ensure they are protected before they are exposed to the virus. HPV vaccine may be given beginning at age 55 years, and as late as age 6 years. Most people older than 26 years will not benefit from HPV vaccination. Talk with your health care provider if you want more information. Most children who get the first dose before 34 years of age need 2 doses of HPV vaccine. Anyone who gets the first dose on or after 27 years of age, and younger people with certain immunocompromising conditions, need 3 doses. Your health care provider can give you more information. HPV vaccine may be given at the same time as other  vaccines. 3. Talk with your health care provider Tell your vaccine provider if the person getting the vaccine:  Has had an allergic reaction after a previous dose of HPV vaccine, or has any severe, life-threatening allergies.  Is pregnant. In some cases, your health care provider may decide to postpone HPV vaccination to a future visit. People with minor illnesses, such as a cold, may be vaccinated. People who are moderately or severely ill should usually wait until they recover before getting HPV vaccine. Your health care provider can give you more information. 4. Risks of a vaccine reaction  Soreness, redness, or swelling where the shot is given can happen after HPV vaccine.  Fever or headache can happen after HPV vaccine. People sometimes faint after medical procedures, including vaccination. Tell your provider if you feel dizzy or have vision changes or ringing in the ears. As with any medicine, there is a very remote chance of a vaccine causing a severe allergic reaction, other serious injury, or death. 5. What if there is a serious problem? An allergic reaction could occur after the vaccinated person leaves the clinic. If you see signs of a severe allergic reaction (hives, swelling of the face and throat, difficulty breathing, a fast heartbeat, dizziness, or weakness), call 9-1-1 and get the person to the nearest hospital. For other signs that concern you, call your health care provider. Adverse reactions should be reported to the Vaccine Adverse Event Reporting System (VAERS). Your health care provider will usually file this report, or you can do it yourself. Visit the VAERS website at www.vaers.LAgents.no or call 952-098-1590. VAERS is only for reporting reactions, and VAERS staff do not give medical advice. 6. The National Vaccine Injury Compensation Program The [employer] Vaccine Injury Compensation Program (VICP) is a federal program that was created to compensate people who may have  been injured by certain vaccines. Visit the VICP website at SpiritualWord.at or call (506)760-4143 to learn about the program and about filing a claim. There is a time limit to file a claim for compensation. 7. How can I learn more?  Ask your health care provider.  Call your local or state health department.  Contact the Centers for Disease Control and Prevention (CDC): ? Call (339)481-3315 (1-800-CDC-INFO) or ? Visit CDC's website at PicCapture.uy Vaccine Information Statement HPV Vaccine (12/09/2017) This information is not intended to replace advice given to you by your health care provider. Make sure you discuss any questions you have with your health care provider. Document Revised: 05/18/2018 Document Reviewed: 09/08/2017 Elsevier Patient Education  2020 [employer].

## 2020-02-01 ENCOUNTER — Telehealth: Payer: Self-pay

## 2020-02-01 LAB — COMPREHENSIVE METABOLIC PANEL
ALT: 13 IU/L (ref 0–32)
AST: 15 IU/L (ref 0–40)
Albumin/Globulin Ratio: 1.6 (ref 1.2–2.2)
Albumin: 4.3 g/dL (ref 3.9–5.0)
Alkaline Phosphatase: 66 IU/L (ref 44–121)
BUN/Creatinine Ratio: 9 (ref 9–23)
BUN: 7 mg/dL (ref 6–20)
Bilirubin Total: <0.2 mg/dL (ref 0.0–1.2)
CO2: 20 mmol/L (ref 20–29)
Calcium: 9 mg/dL (ref 8.7–10.2)
Chloride: 104 mmol/L (ref 96–106)
Creatinine, Ser: 0.8 mg/dL (ref 0.57–1.00)
GFR calc Af Amer: 117 mL/min/{1.73_m2} (ref >59)
GFR calc non Af Amer: 101 mL/min/{1.73_m2} (ref >59)
Globulin, Total: 2.7 g/dL (ref 1.5–4.5)
Glucose: 82 mg/dL (ref 65–99)
Potassium: 4.1 mmol/L (ref 3.5–5.2)
Sodium: 138 mmol/L (ref 134–144)
Total Protein: 7 g/dL (ref 6.0–8.5)

## 2020-02-01 LAB — CBC WITH DIFFERENTIAL/PLATELET
Basophils Absolute: 0.1 10*3/uL (ref 0.0–0.2)
Basos: 1 %
EOS (ABSOLUTE): 0 10*3/uL (ref 0.0–0.4)
Eos: 0 %
Hematocrit: 42.1 % (ref 34.0–46.6)
Hemoglobin: 13.6 g/dL (ref 11.1–15.9)
Immature Grans (Abs): 0 10*3/uL (ref 0.0–0.1)
Immature Granulocytes: 0 %
Lymphocytes Absolute: 2.6 10*3/uL (ref 0.7–3.1)
Lymphs: 32 %
MCH: 27.2 pg (ref 26.6–33.0)
MCHC: 32.3 g/dL (ref 31.5–35.7)
MCV: 84 fL (ref 79–97)
Monocytes Absolute: 0.5 10*3/uL (ref 0.1–0.9)
Monocytes: 7 %
Neutrophils Absolute: 4.8 10*3/uL (ref 1.4–7.0)
Neutrophils: 60 %
Platelets: 353 10*3/uL (ref 150–450)
RBC: 5 x10E6/uL (ref 3.77–5.28)
RDW: 14.1 % (ref 11.7–15.4)
WBC: 8 10*3/uL (ref 3.4–10.8)

## 2020-02-01 LAB — LIPID PANEL W/O CHOL/HDL RATIO
Cholesterol, Total: 214 mg/dL — ABNORMAL HIGH (ref 100–199)
HDL: 58 mg/dL (ref >39)
LDL Chol Calc (NIH): 122 mg/dL — ABNORMAL HIGH (ref 0–99)
Triglycerides: 193 mg/dL — ABNORMAL HIGH (ref 0–149)
VLDL Cholesterol Cal: 34 mg/dL (ref 5–40)

## 2020-02-01 LAB — TSH: TSH: 0.849 u[IU]/mL (ref 0.450–4.500)

## 2020-02-01 LAB — VITAMIN D 25 HYDROXY (VIT D DEFICIENCY, FRACTURES): Vit D, 25-Hydroxy: 47.5 ng/mL (ref 30.0–100.0)

## 2020-02-01 NOTE — Telephone Encounter (Signed)
Medication management - called [facility] to inquire about pt's pending prior authorization request faxed in for pt's Lunesta on 01/27/20.  Samara Deist, representative reported they did not receive it but verified their fax that we have confirmed received.  Redid the prior authorization by phone with collateral and also refaxed completed forms that were done by Dr. Elna Breslow previously and the sent the fax confirmation form from original request too from 01/27/20.  PA still pending but resubmitted this date for Lunesta.

## 2020-02-02 ENCOUNTER — Telehealth: Payer: Self-pay | Admitting: *Deleted

## 2020-02-02 NOTE — Telephone Encounter (Signed)
Made call to [facility].  They stated that the Lunesta 1mg  quantity 30 did not need a prior auth.  I asked for a confirmation number anyway 02/01/20-01/31/21.  Made call to patient who stated she had Trazadone ordered by her PCP.  She stated that it worked for her and she doesn't want Lunesta. PCP ordered 100 mg but she is only taking 50 mg.

## 2020-02-08 ENCOUNTER — Ambulatory Visit (INDEPENDENT_AMBULATORY_CARE_PROVIDER_SITE_OTHER): Payer: BC Managed Care – PPO | Admitting: Licensed Clinical Social Worker

## 2020-02-08 ENCOUNTER — Other Ambulatory Visit: Payer: Self-pay

## 2020-02-08 ENCOUNTER — Encounter: Payer: Self-pay | Admitting: Licensed Clinical Social Worker

## 2020-02-08 DIAGNOSIS — F17200 Nicotine dependence, unspecified, uncomplicated: Secondary | ICD-10-CM

## 2020-02-08 DIAGNOSIS — F4010 Social phobia, unspecified: Secondary | ICD-10-CM

## 2020-02-08 DIAGNOSIS — F4310 Post-traumatic stress disorder, unspecified: Secondary | ICD-10-CM | POA: Diagnosis not present

## 2020-02-08 DIAGNOSIS — F3131 Bipolar disorder, current episode depressed, mild: Secondary | ICD-10-CM

## 2020-02-08 NOTE — Progress Notes (Signed)
Virtual Visit via Video Note  I connected with Pickett on 02/08/20 at  3:00 PM EST by a video enabled telemedicine application and verified that I am speaking with the correct person using two identifiers.  Participating Parties Patient Provider  Location: Patient: Home Provider: Home Office   I discussed the limitations of evaluation and management by telemedicine and the availability of in person appointments. The patient expressed understanding and agreed to proceed.  THERAPY PROGRESS NOTE  Session Time: 39 Minutes  Participation Level: Active  Behavioral Response: CasualAlertAnxious  Type of Therapy: Individual Therapy  Treatment Goals addressed: Anxiety and Coping  Interventions: CBT  Summary: Mariah Gonzalez is a 29 y.o. female who presents with depression and anxiety sxs. Pt reported feeling "okay" today and is in process of getting organized. Pt reported she spent time with family for Christmas and "tried to stay surface level" with conversations as to not upset anyone. Pt reported concerns that she and spouse will not be accepted by their families due to sexual orientation and need for supports. Pt reported having issues with obtaining Lunesta that she was prescribed due to need for prior authorization and ultimately found that insurance would not approve making it cost over $300. Pt decided not to pick up the prescription from her pharmacy and has been taking her spouse's trazadone to help with sleep and it "seems to be working". Pt reported desire to quit smoking cigarettes.  Suicidal/Homicidal: No  Therapist Response: Therapist met with patient for follow up. Therapist and patient explored sxs, thoughts, feelings and reactions to stressors. Therapist provided psychoeducation around harm reduction model for reducing smoking. Therapist validated patient thoughts/concerns. Pt was receptive.  Plan: Return again in 3 weeks.  Diagnosis: Axis I: Bipolar,  Depressed, Post Traumatic Stress Disorder and Social Anxiety    Axis II: N/A  [name], LCSW, LCAS 02/08/2020

## 2020-02-23 ENCOUNTER — Encounter: Payer: Self-pay | Admitting: Family Medicine

## 2020-02-23 ENCOUNTER — Encounter: Payer: Self-pay | Admitting: Psychiatry

## 2020-02-23 ENCOUNTER — Telehealth (INDEPENDENT_AMBULATORY_CARE_PROVIDER_SITE_OTHER): Payer: Self-pay | Admitting: Family Medicine

## 2020-02-23 ENCOUNTER — Telehealth (INDEPENDENT_AMBULATORY_CARE_PROVIDER_SITE_OTHER): Payer: BC Managed Care – PPO | Admitting: Psychiatry

## 2020-02-23 ENCOUNTER — Other Ambulatory Visit: Payer: Self-pay

## 2020-02-23 VITALS — Temp 98.7°F

## 2020-02-23 DIAGNOSIS — F17200 Nicotine dependence, unspecified, uncomplicated: Secondary | ICD-10-CM

## 2020-02-23 DIAGNOSIS — F3131 Bipolar disorder, current episode depressed, mild: Secondary | ICD-10-CM

## 2020-02-23 DIAGNOSIS — Z20822 Contact with and (suspected) exposure to covid-19: Secondary | ICD-10-CM

## 2020-02-23 DIAGNOSIS — Z8659 Personal history of other mental and behavioral disorders: Secondary | ICD-10-CM

## 2020-02-23 DIAGNOSIS — F4310 Post-traumatic stress disorder, unspecified: Secondary | ICD-10-CM

## 2020-02-23 DIAGNOSIS — F4010 Social phobia, unspecified: Secondary | ICD-10-CM

## 2020-02-23 MED ORDER — TRAZODONE HCL 50 MG PO TABS: 75.0000 mg | ORAL_TABLET | Freq: Every evening | ORAL | 1 refills | Status: DC | PRN

## 2020-02-23 MED ORDER — PREDNISONE 50 MG PO TABS: 50.0000 mg | ORAL_TABLET | Freq: Every day | ORAL | 0 refills | Status: DC

## 2020-02-23 MED ORDER — HYDROCOD POLST-CPM POLST ER 10-8 MG/5ML PO SUER: 5.0000 mL | Freq: Two times a day (BID) | ORAL | 0 refills | Status: DC | PRN

## 2020-02-23 MED ORDER — BENZONATATE 200 MG PO CAPS: 200.0000 mg | ORAL_CAPSULE | Freq: Two times a day (BID) | ORAL | 0 refills | Status: DC | PRN

## 2020-02-23 NOTE — Addendum Note (Signed)
Addended by: Dorcas Carrow on: 02/23/2020 04:27 PM   Modules accepted: Orders

## 2020-02-23 NOTE — Progress Notes (Signed)
Virtual Visit via Video Note  I connected with Mariah Gonzalez on 02/23/20 at  3:00 PM EST by a video enabled telemedicine application and verified that I am speaking with the correct person using two identifiers.  Location Provider Location : [facility] Patient Location : Home  Participants: Patient , Provider   I discussed the limitations of evaluation and management by telemedicine and the availability of in person appointments. The patient expressed understanding and agreed to proceed.   I discussed the assessment and treatment plan with the patient. The patient was provided an opportunity to ask questions and all were answered. The patient agreed with the plan and demonstrated an understanding of the instructions.   The patient was advised to call back or seek an in-person evaluation if the symptoms worsen or if the condition fails to improve as anticipated.   BH MD OP Progress Note  02/23/2020 3:32 PM Mariah Gonzalez  MRN:  [id number]  Chief Complaint:  Chief Complaint    Follow-up     HPI: Mariah Gonzalez is a 30 year old Caucasian female, employed, lives in [place], has a history of bipolar disorder, PTSD, ADHD, dysfunctional uterine bleeding, sleep problems was evaluated by telemedicine today.  Patient today reports she has upper respiratory tract infection symptoms like congestion, runny nose and also has a severe fatigue.  She reports several people in her family got tested positive for COVID-19 infection recently.  She had a virtual appointment with her primary care provider and is scheduled for  testing.  She is currently isolating herself.  Patient reports she was unable to get the Lunesta filled.  She however reports in the meantime trazodone was prescribed by her primary care provider.  She is currently on 50 mg and that helps to some extent.  She is interested in a dosage increase.  Patient reports overall she was feeling much better with her mood until she  started having possible COVID-19 symptoms.  She is currently anxious however is coping okay.  She continues to follow-up with her therapist and reports therapy sessions as beneficial.  Patient denies any suicidality, homicidality or perceptual disturbances.  She is planning to quit smoking.  Patient denies any other concerns today.  Visit Diagnosis:    ICD-10-CM   1. Bipolar 1 disorder, depressed, mild (HCC)  F31.31   2. PTSD (post-traumatic stress disorder)  F43.10 traZODone (DESYREL) 50 MG tablet  3. Social anxiety disorder  F40.10   4. Tobacco use disorder  F17.200   5. History of ADHD  Z86.59     Past Psychiatric History: I have reviewed past psychiatric history from my progress note on 10/11/2019.  Past trials of Lamictal, Celexa,Adzenys XR  Past Medical History:  Past Medical History:  Diagnosis Date  . ADHD   . Anxiety   . Asthma   . Depressed   . DUB (dysfunctional uterine bleeding)   . IBS (irritable bowel syndrome)    History reviewed. No pertinent surgical history.  Family Psychiatric History: I have reviewed family psychiatric history from my progress note on 10/11/2019  Family History:  Family History  Problem Relation Age of Onset  . Hyperlipidemia Mother   . Hypertension Mother   . Depression Mother   . Hyperlipidemia Father   . Hypertension Father   . Mental illness Father   . Cancer Maternal Aunt   . Cancer Maternal Uncle   . Cancer Paternal Aunt   . Hearing loss Paternal Aunt   . Cancer Paternal Uncle   .  Hearing loss Paternal Uncle     Social History: Reviewed social history from my progress note on 10/11/2019 Social History   Socioeconomic History  . Marital status: Married    Spouse name: Not on file  . Number of children: Not on file  . Years of education: Not on file  . Highest education level: Not on file  Occupational History  . Not on file  Tobacco Use  . Smoking status: Current Every Day Smoker    Packs/day: 0.25    Types:  Cigarettes  . Smokeless tobacco: Never Used  Vaping Use  . Vaping Use: Never used  Substance and Sexual Activity  . Alcohol use: No  . Drug use: Never  . Sexual activity: Yes  Other Topics Concern  . Not on file  Social History Narrative  . Not on file   Social Determinants of Health   Financial Resource Strain: Not on file  Food Insecurity: Not on file  Transportation Needs: Not on file  Physical Activity: Not on file  Stress: Not on file  Social Connections: Not on file    Allergies:  Allergies  Allergen Reactions  . Eggs Or Egg-Derived Products Diarrhea    Metabolic Disorder Labs: No results found for: HGBA1C, MPG No results found for: PROLACTIN Lab Results  Component Value Date   CHOL 214 (H) 01/31/2020   TRIG 193 (H) 01/31/2020   HDL 58 01/31/2020   LDLCALC 122 (H) 01/31/2020   LDLCALC 147 (H) 08/02/2019   Lab Results  Component Value Date   TSH 0.849 01/31/2020   TSH 1.180 08/02/2019    Therapeutic Level Labs: No results found for: LITHIUM No results found for: VALPROATE No components found for:  CBMZ  Current Medications: Current Outpatient Medications  Medication Sig Dispense Refill  . traZODone (DESYREL) 50 MG tablet Take 1.5 tablets (75 mg total) by mouth at bedtime as needed for sleep. 45 tablet 1  . albuterol (VENTOLIN HFA) 108 (90 Base) MCG/ACT inhaler Inhale 2 puffs into the lungs every 6 (six) hours as needed for wheezing or shortness of breath. 8 g 1  . baclofen (LIORESAL) 10 MG tablet Take 0.5-1 tablets (5-10 mg total) by mouth 3 (three) times daily. 90 each 0  . benzonatate (TESSALON) 200 MG capsule Take 1 capsule (200 mg total) by mouth 2 (two) times daily as needed for cough. 20 capsule 0  . cetirizine (ZYRTEC) 10 MG tablet Take 10 mg by mouth daily.    . chlorpheniramine-HYDROcodone (TUSSIONEX PENNKINETIC ER) 10-8 MG/5ML SUER Take 5 mLs by mouth every 12 (twelve) hours as needed. 50 mL 0  . cyclobenzaprine (FLEXERIL) 10 MG tablet Take 1  tablet (10 mg total) by mouth at bedtime. 30 tablet 2  . diphenhydramine-acetaminophen (TYLENOL PM) 25-500 MG TABS tablet Take 1 tablet by mouth at bedtime as needed.    . etonogestrel-ethinyl estradiol (NUVARING) 0.12-0.015 MG/24HR vaginal ring Place vaginally.    . lamoTRIgine (LAMICTAL) 200 MG tablet Take 1 tablet (200 mg total) by mouth daily. 90 tablet 0  . lamoTRIgine (LAMICTAL) 25 MG tablet Take 2 tablets (50 mg total) by mouth daily. To be combined with 200 mg daily at bedtime 180 tablet 0  . lidocaine (XYLOCAINE) 2 % jelly Apply topically.     Marland Kitchen MELATONIN PO Take by mouth.    Marland Kitchen omeprazole (PRILOSEC) 20 MG capsule Take 20 mg by mouth daily.    . predniSONE (DELTASONE) 50 MG tablet Take 1 tablet (50 mg total) by mouth  daily with breakfast. 5 tablet 0  . venlafaxine XR (EFFEXOR XR) 75 MG 24 hr capsule Take 1 capsule (75 mg total) by mouth daily with breakfast. 90 capsule 0  . Vitamin D, Ergocalciferol, (DRISDOL) 1.25 MG (50000 UNIT) CAPS capsule Take 50,000 Units by mouth once a week.     No current facility-administered medications for this visit.     Musculoskeletal: Strength & Muscle Tone: UTA Gait & Station: UTA Patient leans: N/A  Psychiatric Specialty Exam: Review of Systems  Constitutional: Positive for fatigue.  HENT: Positive for congestion and rhinorrhea.   Psychiatric/Behavioral: Positive for sleep disturbance. The patient is nervous/anxious.   All other systems reviewed and are negative.   There were no vitals taken for this visit.There is no height or weight on file to calculate BMI.  General Appearance: Casual  Eye Contact:  Good  Speech:  Clear and Coherent  Volume:  Normal  Mood:  Anxious  Affect:  Congruent  Thought Process:  Goal Directed and Descriptions of Associations: Intact  Orientation:  Full (Time, Place, and Person)  Thought Content: Logical   Suicidal Thoughts:  No  Homicidal Thoughts:  No  Memory:  Immediate;   Fair Recent;   Fair Remote;    Fair  Judgement:  Fair  Insight:  Fair  Psychomotor Activity:  Normal  Concentration:  Concentration: Fair and Attention Span: Fair  Recall:  [employer] of Knowledge: Fair  Language: Fair  Akathisia:  No  Handed:  Right  AIMS (if indicated): UTA  Assets:  Communication Skills Desire for Improvement Housing Intimacy Social Support Talents/Skills Transportation Vocational/Educational  ADL's:  Intact  Cognition: WNL  Sleep:  Improving   Screenings: GAD-7   Flowsheet Row Office Visit from 08/02/2019 in [facility]  Total GAD-7 Score 10    PHQ2-9   Flowsheet Row Video Visit from 02/23/2020 in [facility] Office Visit from 01/31/2020 in [facility] Office Visit from 08/02/2019 in [facility]  PHQ-2 Total Score 2 6 2   PHQ-9 Total Score 13 16 11        Assessment and Plan: LILJA SOLAND is a 30 year old Caucasian female, married, employed, has a history of bipolar disorder, sleep disorder, ADHD, PTSD, DOB was evaluated by telemedicine today.  Patient is biologically predisposed given her history of trauma, her own health problems, family history.  Patient with multiple psychosocial stressors including job related stress, recent medical problems.  Patient is currently in isolation for possible COVID-19 infection.  Discussed plan as noted below.  Plan Bipolar disorder type I depressed-improving Lamictal 250 mg p.o. daily Venlafaxine 75 mg p.o. daily  PTSD-improving Increase trazodone to 75 mg p.o. nightly Continue venlafaxine as prescribed Discontinue Lunesta for cost. Continue CBT with Ms. Mariah Pal  Social anxiety disorder-improving Continue venlafaxine as prescribed Continue CBT  Tobacco use disorder-improving She is planning to quit.  Patient has upcoming sleep study scheduled.  Follow-up in clinic in 3 weeks or sooner if needed.  I have spent atleast 20 minutes face to face by video with patient today.  More than 50 % of the time was spent for preparing to see the patient ( e.g., review of test, records ), ordering medications and test ,psychoeducation and supportive psychotherapy and care coordination,as well as documenting clinical information in electronic health record. This note was generated in part or whole with voice recognition software. Voice recognition is usually quite accurate but there are transcription errors that can and very often do occur.  I apologize for any typographical errors that were not detected and corrected.      Jomarie Longs, MD 02/24/2020, 8:19 AM

## 2020-02-23 NOTE — Progress Notes (Signed)
Temp 98.7 F (37.1 C) (Oral)    Subjective:    Patient ID: Mariah Gonzalez, female    DOB: [birth year], 30 y.o.   MRN: [id number]  HPI: Mariah Gonzalez is a 30 y.o. female  Chief Complaint  Patient presents with  . Nasal Congestion  . Sore Throat    Started a few days ago   . Headache    Patient states that she does not feel great, Patient states that she hot but does not have a fever   . Cough  . Fatigue   UPPER RESPIRATORY TRACT INFECTION Duration: Sunday/Monday Worst symptom: headache and facial pain Fever: no- sweats Cough: yes Shortness of breath: no Wheezing: no Chest pain: no Chest tightness: no Chest congestion: yes Nasal congestion: yes Runny nose: yes Post nasal drip: yes Sneezing: no Sore throat: yes Swollen glands: no Sinus pressure: yes Headache: yes Face pain: yes Toothache: no Ear pain: yes bilateral Ear pressure: yes "right Eyes red/itching:yes Eye drainage/crusting: yes  Vomiting: no Rash: no Fatigue: yes Sick contacts: yes Strep contacts: no  Context: worse Recurrent sinusitis: no Relief with OTC cold/cough medications: no   Relevant past medical, surgical, family and social history reviewed and updated as indicated. Interim medical history since our last visit reviewed. Allergies and medications reviewed and updated.  Review of Systems  Constitutional: Positive for chills, diaphoresis and fatigue. Negative for activity change, appetite change, fever and unexpected weight change.  HENT: Positive for congestion, postnasal drip, rhinorrhea and sore throat. Negative for dental problem, drooling, ear discharge, ear pain, facial swelling, hearing loss, mouth sores, nosebleeds, sinus pressure, sinus pain, sneezing, tinnitus, trouble swallowing and voice change.   Eyes: Negative.   Respiratory: Positive for cough, chest tightness, shortness of breath and wheezing. Negative for apnea, choking and stridor.   Cardiovascular: Negative.    Gastrointestinal: Negative.   Skin: Negative.   Psychiatric/Behavioral: Negative.     Per HPI unless specifically indicated above     Objective:    Temp 98.7 F (37.1 C) (Oral)   Wt Readings from Last 3 Encounters:  01/31/20 224 lb 3.2 oz (101.7 kg)  10/13/19 205 lb (93 kg)  08/02/19 196 lb (88.9 kg)    Physical Exam Vitals and nursing note reviewed.  Constitutional:      General: She is not in acute distress.    Appearance: Normal appearance. She is not ill-appearing, toxic-appearing or diaphoretic.  HENT:     Head: Normocephalic and atraumatic.     Right Ear: External ear normal.     Left Ear: External ear normal.     Nose: Nose normal.     Mouth/Throat:     Mouth: Mucous membranes are moist.     Pharynx: Oropharynx is clear.  Eyes:     General: No scleral icterus.       Right eye: No discharge.        Left eye: No discharge.     Conjunctiva/sclera: Conjunctivae normal.     Pupils: Pupils are equal, round, and reactive to light.  Pulmonary:     Effort: Pulmonary effort is normal. No respiratory distress.     Comments: Speaking in full sentences Musculoskeletal:        General: Normal range of motion.     Cervical back: Normal range of motion.  Skin:    Coloration: Skin is not jaundiced or pale.     Findings: No bruising, erythema, lesion or rash.  Neurological:     Mental  Status: She is alert and oriented to person, place, and time. Mental status is at baseline.  Psychiatric:        Mood and Affect: Mood normal.        Behavior: Behavior normal.        Thought Content: Thought content normal.        Judgment: Judgment normal.     Results for orders placed or performed in visit on 01/31/20  CBC with Differential/Platelet  Result Value Ref Range   WBC 8.0 3.4 - 10.8 x10E3/uL   RBC 5.00 3.77 - 5.28 x10E6/uL   Hemoglobin 13.6 11.1 - 15.9 g/dL   Hematocrit 47.6 54.6 - 46.6 %   MCV 84 79 - 97 fL   MCH 27.2 26.6 - 33.0 pg   MCHC 32.3 31.5 - 35.7 g/dL    RDW 50.3 54.6 - 56.8 %   Platelets 353 150 - 450 x10E3/uL   Neutrophils 60 Not Estab. %   Lymphs 32 Not Estab. %   Monocytes 7 Not Estab. %   Eos 0 Not Estab. %   Basos 1 Not Estab. %   Neutrophils Absolute 4.8 1.4 - 7.0 x10E3/uL   Lymphocytes Absolute 2.6 0.7 - 3.1 x10E3/uL   Monocytes Absolute 0.5 0.1 - 0.9 x10E3/uL   EOS (ABSOLUTE) 0.0 0.0 - 0.4 x10E3/uL   Basophils Absolute 0.1 0.0 - 0.2 x10E3/uL   Immature Granulocytes 0 Not Estab. %   Immature Grans (Abs) 0.0 0.0 - 0.1 x10E3/uL  Comprehensive metabolic panel  Result Value Ref Range   Glucose 82 65 - 99 mg/dL   BUN 7 6 - 20 mg/dL   Creatinine, Ser 1.27 0.57 - 1.00 mg/dL   GFR calc non Af Amer 101 >59 mL/min/1.73   GFR calc Af Amer 117 >59 mL/min/1.73   BUN/Creatinine Ratio 9 9 - 23   Sodium 138 134 - 144 mmol/L   Potassium 4.1 3.5 - 5.2 mmol/L   Chloride 104 96 - 106 mmol/L   CO2 20 20 - 29 mmol/L   Calcium 9.0 8.7 - 10.2 mg/dL   Total Protein 7.0 6.0 - 8.5 g/dL   Albumin 4.3 3.9 - 5.0 g/dL   Globulin, Total 2.7 1.5 - 4.5 g/dL   Albumin/Globulin Ratio 1.6 1.2 - 2.2   Bilirubin Total <0.2 0.0 - 1.2 mg/dL   Alkaline Phosphatase 66 44 - 121 IU/L   AST 15 0 - 40 IU/L   ALT 13 0 - 32 IU/L  Lipid Panel w/o Chol/HDL Ratio  Result Value Ref Range   Cholesterol, Total 214 (H) 100 - 199 mg/dL   Triglycerides 517 (H) 0 - 149 mg/dL   HDL 58 >00 mg/dL   VLDL Cholesterol Cal 34 5 - 40 mg/dL   LDL Chol Calc (NIH) 174 (H) 0 - 99 mg/dL  TSH  Result Value Ref Range   TSH 0.849 0.450 - 4.500 uIU/mL  VITAMIN D 25 Hydroxy (Vit-D Deficiency, Fractures)  Result Value Ref Range   Vit D, 25-Hydroxy 47.5 30.0 - 100.0 ng/mL      Assessment & Plan:   Problem List Items Addressed This Visit   None   Visit Diagnoses    Suspected COVID-19 virus infection    -  Primary   Will get swabbed. Self-quarantine until results are back. Prednisone, tussionex, tessalon for comfort. Call with any concerns or if not getting better.    Relevant  Orders   Novel Coronavirus, NAA ([facility])       Follow  up plan: Return if symptoms worsen or fail to improve.   . This visit was completed via MyChart due to the restrictions of the COVID-19 pandemic. All issues as above were discussed and addressed. Physical exam was done as above through visual confirmation on MyChart. If it was felt that the patient should be evaluated in the office, they were directed there. The patient verbally consented to this visit. . Location of the patient: home . Location of the provider: home . Those involved with this call:  . Provider: Olevia Perches, DO . CMA: Tristan Schroeder, CMA . Front Desk/Registration: Harriet Pho  . Time spent on call: 15 minutes with patient face to face via video conference. More than 50% of this time was spent in counseling and coordination of care. 23 minutes total spent in review of patient's record and preparation of their chart.

## 2020-02-23 NOTE — Addendum Note (Signed)
Addended by: Enedina Finner on: 02/23/2020 04:16 PM   Modules accepted: Orders

## 2020-02-23 NOTE — Patient Instructions (Signed)
Trazodone Tablets What is this medicine? TRAZODONE (TRAZ oh done) is used to treat depression. This medicine may be used for other purposes; ask your health care provider or pharmacist if you have questions. COMMON BRAND NAME(S): Desyrel What should I tell my health care provider before I take this medicine? They need to know if you have any of these conditions:  attempted suicide or thinking about it  bipolar disorder  bleeding problems  glaucoma  heart disease, or previous heart attack  irregular heart beat  kidney or liver disease  low levels of sodium in the blood  an unusual or allergic reaction to trazodone, other medicines, foods, dyes or preservatives  pregnant or trying to get pregnant  breast-feeding How should I use this medicine? Take this medicine by mouth with a glass of water. Follow the directions on the prescription label. Take this medicine shortly after a meal or a light snack. Take your medicine at regular intervals. Do not take your medicine more often than directed. Do not stop taking this medicine suddenly except upon the advice of your doctor. Stopping this medicine too quickly may cause serious side effects or your condition may worsen. A special MedGuide will be given to you by the pharmacist with each prescription and refill. Be sure to read this information carefully each time. Talk to your pediatrician regarding the use of this medicine in children. Special care may be needed. Overdosage: If you think you have taken too much of this medicine contact a poison control center or emergency room at once. NOTE: This medicine is only for you. Do not share this medicine with others. What if I miss a dose? If you miss a dose, take it as soon as you can. If it is almost time for your next dose, take only that dose. Do not take double or extra doses. What may interact with this medicine? Do not take this medicine with any of the following  medications:  certain medicines for fungal infections like fluconazole, itraconazole, ketoconazole, posaconazole, voriconazole  cisapride  dronedarone  linezolid  MAOIs like Carbex, Eldepryl, Marplan, Nardil, and Parnate  mesoridazine  methylene blue (injected into a vein)  pimozide  saquinavir  thioridazine This medicine may also interact with the following medications:  alcohol  antiviral medicines for HIV or AIDS  aspirin and aspirin-like medicines  barbiturates like phenobarbital  certain medicines for blood pressure, heart disease, irregular heart beat  certain medicines for depression, anxiety, or psychotic disturbances  certain medicines for migraine headache like almotriptan, eletriptan, frovatriptan, naratriptan, rizatriptan, sumatriptan, zolmitriptan  certain medicines for seizures like carbamazepine and phenytoin  certain medicines for sleep  certain medicines that treat or prevent blood clots like dalteparin, enoxaparin, warfarin  digoxin  fentanyl  lithium  NSAIDS, medicines for pain and inflammation, like ibuprofen or naproxen  other medicines that prolong the QT interval (cause an abnormal heart rhythm) like dofetilide  rasagiline  supplements like St. John's wort, kava kava, valerian  tramadol  tryptophan This list may not describe all possible interactions. Give your health care provider a list of all the medicines, herbs, non-prescription drugs, or dietary supplements you use. Also tell them if you smoke, drink alcohol, or use illegal drugs. Some items may interact with your medicine. What should I watch for while using this medicine? Tell your doctor if your symptoms do not get better or if they get worse. Visit your doctor or health care professional for regular checks on your progress. Because it may take   several weeks to see the full effects of this medicine, it is important to continue your treatment as prescribed by your  doctor. Patients and their families should watch out for new or worsening thoughts of suicide or depression. Also watch out for sudden changes in feelings such as feeling anxious, agitated, panicky, irritable, hostile, aggressive, impulsive, severely restless, overly excited and hyperactive, or not being able to sleep. If this happens, especially at the beginning of treatment or after a change in dose, call your health care professional. You may get drowsy or dizzy. Do not drive, use machinery, or do anything that needs mental alertness until you know how this medicine affects you. Do not stand or sit up quickly, especially if you are an older patient. This reduces the risk of dizzy or fainting spells. Alcohol may interfere with the effect of this medicine. Avoid alcoholic drinks. This medicine may cause dry eyes and blurred vision. If you wear contact lenses you may feel some discomfort. Lubricating drops may help. See your eye doctor if the problem does not go away or is severe. Your mouth may get dry. Chewing sugarless gum, sucking hard candy and drinking plenty of water may help. Contact your doctor if the problem does not go away or is severe. What side effects may I notice from receiving this medicine? Side effects that you should report to your doctor or health care professional as soon as possible:  allergic reactions like skin rash, itching or hives, swelling of the face, lips, or tongue  elevated mood, decreased need for sleep, racing thoughts, impulsive behavior  confusion  fast, irregular heartbeat  feeling faint or lightheaded, falls  feeling agitated, angry, or irritable  loss of balance or coordination  painful or prolonged erections  restlessness, pacing, inability to keep still  suicidal thoughts or other mood changes  tremors  trouble sleeping  seizures  unusual bleeding or bruising Side effects that usually do not require medical attention (report to your doctor  or health care professional if they continue or are bothersome):  change in sex drive or performance  change in appetite or weight  constipation  headache  muscle aches or pains  nausea This list may not describe all possible side effects. Call your doctor for medical advice about side effects. You may report side effects to FDA at 1-800-FDA-1088. Where should I keep my medicine? Keep out of the reach of children. Store at room temperature between 15 and 30 degrees C (59 to 86 degrees F). Protect from light. Keep container tightly closed. Throw away any unused medicine after the expiration date. NOTE: This sheet is a summary. It may not cover all possible information. If you have questions about this medicine, talk to your doctor, pharmacist, or health care provider.  2021 Elsevier/Gold Standard (2019-12-19 14:46:11)  

## 2020-02-24 ENCOUNTER — Encounter: Payer: Self-pay | Admitting: Family Medicine

## 2020-02-25 LAB — SARS-COV-2, NAA 2 DAY TAT

## 2020-02-25 LAB — NOVEL CORONAVIRUS, NAA: SARS-CoV-2, NAA: NOT DETECTED

## 2020-02-28 ENCOUNTER — Ambulatory Visit: Payer: BC Managed Care – PPO | Attending: Otolaryngology

## 2020-02-28 ENCOUNTER — Ambulatory Visit (INDEPENDENT_AMBULATORY_CARE_PROVIDER_SITE_OTHER): Payer: Self-pay | Admitting: Licensed Clinical Social Worker

## 2020-02-28 ENCOUNTER — Encounter: Payer: Self-pay | Admitting: Licensed Clinical Social Worker

## 2020-02-28 ENCOUNTER — Other Ambulatory Visit: Payer: Self-pay

## 2020-02-28 DIAGNOSIS — F5101 Primary insomnia: Secondary | ICD-10-CM | POA: Diagnosis not present

## 2020-02-28 DIAGNOSIS — F3131 Bipolar disorder, current episode depressed, mild: Secondary | ICD-10-CM

## 2020-02-28 DIAGNOSIS — F4310 Post-traumatic stress disorder, unspecified: Secondary | ICD-10-CM

## 2020-02-28 DIAGNOSIS — F4010 Social phobia, unspecified: Secondary | ICD-10-CM

## 2020-02-28 NOTE — Progress Notes (Signed)
Virtual Visit via Video Note  I connected with [place] on 02/28/20 at  3:00 PM EST by a video enabled telemedicine application and verified that I am speaking with the correct person using two identifiers.  Participating Parties Patient Provider  Location: Patient: Home Provider: Home Office   I discussed the limitations of evaluation and management by telemedicine and the availability of in person appointments. The patient expressed understanding and agreed to proceed.  THERAPY PROGRESS NOTE  Session Time: 5 Minutes  Participation Level: Active  Behavioral Response: CasualLethargicTired  Type of Therapy: Individual Therapy  Treatment Goals addressed: Coping  Interventions: Supportive  Summary: Mariah Gonzalez is a 30 y.o. female who presents with depression and anxiety sxs. Pt appeared tired and s/ "I just woke up". Pt reported "I am not drowning, but not great. In a lot of pain today" and sick to her stomach. Pt reported this usually happens around the beginning of her menstrual cycle. Pt reported no other concerns at this time. Pt reported she would like to keep today's check-in brief and will follow up next week.  Suicidal/Homicidal: No  Therapist Response: Therapist met with patient briefly for follow up. Therapist and patient reviewed current sxs. Session ended early due to patient feeling ill.  Plan: Return again in 1 week.  Diagnosis: Axis I: Bipolar, Depressed, Post Traumatic Stress Disorder and Social Anxiety    Axis II: N/A  [name], LCSW, LCAS 02/28/2020

## 2020-03-05 ENCOUNTER — Other Ambulatory Visit: Payer: Self-pay

## 2020-03-05 DIAGNOSIS — R059 Cough, unspecified: Secondary | ICD-10-CM

## 2020-03-06 ENCOUNTER — Encounter: Payer: Self-pay | Admitting: Family Medicine

## 2020-03-06 ENCOUNTER — Telehealth (INDEPENDENT_AMBULATORY_CARE_PROVIDER_SITE_OTHER): Payer: Self-pay | Admitting: Family Medicine

## 2020-03-06 DIAGNOSIS — J069 Acute upper respiratory infection, unspecified: Secondary | ICD-10-CM

## 2020-03-06 LAB — SARS-COV-2, NAA 2 DAY TAT

## 2020-03-06 LAB — NOVEL CORONAVIRUS, NAA: SARS-CoV-2, NAA: NOT DETECTED

## 2020-03-06 MED ORDER — HYDROCOD POLST-CPM POLST ER 10-8 MG/5ML PO SUER
5.0000 mL | Freq: Two times a day (BID) | ORAL | 0 refills | Status: DC | PRN
Start: 2020-03-06 — End: 2020-05-03

## 2020-03-06 MED ORDER — BENZONATATE 200 MG PO CAPS: 200.0000 mg | ORAL_CAPSULE | Freq: Two times a day (BID) | ORAL | 0 refills | Status: DC | PRN

## 2020-03-06 NOTE — Patient Instructions (Signed)
It was great to see you!  Our plans for today:  - See below for self-isolation guidelines. You may end your quarantine if your test is negative or if positive, once you are 10 days from symptom onset and fever free for 24 hours without use of tylenol or ibuprofen.  - If your test is positive, we will be referring you for monoclonal antibody treatment. Someone will be contacting you about scheduling this if you qualify. We are experiencing a Sport and exercise psychologist of this and the oral antivirals as well as a backlog of patients needing treatment so this may cause a delay. - Certainly, if you are having difficulties breathing or unable to keep down fluids, go to the Emergency Department.   Take care and seek immediate care sooner if you develop any concerns.   Dr. Linwood Dibbles     Person Under Monitoring Name: Mariah Gonzalez  Location: [address] [place] [address]   Infection Prevention Recommendations for Individuals Confirmed to have, or Being Evaluated for, 2019 Novel Coronavirus (COVID-19) Infection Who Receive Care at Home  Individuals who are confirmed to have, or are being evaluated for, COVID-19 should follow the prevention steps below until a healthcare provider or local or state health department says they can return to normal activities.  Stay home except to get medical care You should restrict activities outside your home, except for getting medical care. Do not go to work, school, or public areas, and do not use public transportation or taxis.  Call ahead before visiting your doctor Before your medical appointment, call the healthcare provider and tell them that you have, or are being evaluated for, COVID-19 infection. This will help the healthcare provider's office take steps to keep other people from getting infected. Ask your healthcare provider to call the local or state health department.  Monitor your symptoms Seek prompt medical attention if your illness is  worsening (e.g., difficulty breathing). Before going to your medical appointment, call the healthcare provider and tell them that you have, or are being evaluated for, COVID-19 infection. Ask your healthcare provider to call the local or state health department.  Wear a facemask You should wear a facemask that covers your nose and mouth when you are in the same room with other people and when you visit a healthcare provider. People who live with or visit you should also wear a facemask while they are in the same room with you.  Separate yourself from other people in your home As much as possible, you should stay in a different room from other people in your home. Also, you should use a separate bathroom, if available.  Avoid sharing household items You should not share dishes, drinking glasses, cups, eating utensils, towels, bedding, or other items with other people in your home. After using these items, you should wash them thoroughly with soap and water.  Cover your coughs and sneezes Cover your mouth and nose with a tissue when you cough or sneeze, or you can cough or sneeze into your sleeve. Throw used tissues in a lined trash can, and immediately wash your hands with soap and water for at least 20 seconds or use an alcohol-based hand rub.  Wash your [employer] your hands often and thoroughly with soap and water for at least 20 seconds. You can use an alcohol-based hand sanitizer if soap and water are not available and if your hands are not visibly dirty. Avoid touching your eyes, nose, and mouth with unwashed hands.  Prevention Steps for Caregivers and Household Members of Individuals Confirmed to have, or Being Evaluated for, COVID-19 Infection Being Cared for in the Home  If you live with, or provide care at home for, a person confirmed to have, or being evaluated for, COVID-19 infection please follow these guidelines to prevent infection:  Follow healthcare provider's  instructions Make sure that you understand and can help the patient follow any healthcare provider instructions for all care.  Provide for the patient's basic needs You should help the patient with basic needs in the home and provide support for getting groceries, prescriptions, and other personal needs.  Monitor the patient's symptoms If they are getting sicker, call his or her medical provider and tell them that the patient has, or is being evaluated for, COVID-19 infection. This will help the healthcare provider's office take steps to keep other people from getting infected. Ask the healthcare provider to call the local or state health department.  Limit the number of people who have contact with the patient  If possible, have only one caregiver for the patient.  Other household members should stay in another home or place of residence. If this is not possible, they should stay  in another room, or be separated from the patient as much as possible. Use a separate bathroom, if available.  Restrict visitors who do not have an essential need to be in the home.  Keep older adults, very young children, and other sick people away from the patient Keep older adults, very young children, and those who have compromised immune systems or chronic health conditions away from the patient. This includes people with chronic heart, lung, or kidney conditions, diabetes, and cancer.  Ensure good ventilation Make sure that shared spaces in the home have good air flow, such as from an air conditioner or an opened window, weather permitting.  Wash your hands often  Wash your hands often and thoroughly with soap and water for at least 20 seconds. You can use an alcohol based hand sanitizer if soap and water are not available and if your hands are not visibly dirty.  Avoid touching your eyes, nose, and mouth with unwashed hands.  Use disposable paper towels to dry your hands. If not available, use  dedicated cloth towels and replace them when they become wet.  Wear a facemask and gloves  Wear a disposable facemask at all times in the room and gloves when you touch or have contact with the patient's blood, body fluids, and/or secretions or excretions, such as sweat, saliva, sputum, nasal mucus, vomit, urine, or feces.  Ensure the mask fits over your nose and mouth tightly, and do not touch it during use.  Throw out disposable facemasks and gloves after using them. Do not reuse.  Wash your hands immediately after removing your facemask and gloves.  If your personal clothing becomes contaminated, carefully remove clothing and launder. Wash your hands after handling contaminated clothing.  Place all used disposable facemasks, gloves, and other waste in a lined container before disposing them with other household waste.  Remove gloves and wash your hands immediately after handling these items.  Do not share dishes, glasses, or other household items with the patient  Avoid sharing household items. You should not share dishes, drinking glasses, cups, eating utensils, towels, bedding, or other items with a patient who is confirmed to have, or being evaluated for, COVID-19 infection.  After the person uses these items, you should wash them thoroughly with soap and  water.  Wash laundry thoroughly  Immediately remove and wash clothes or bedding that have blood, body fluids, and/or secretions or excretions, such as sweat, saliva, sputum, nasal mucus, vomit, urine, or feces, on them.  Wear gloves when handling laundry from the patient.  Read and follow directions on labels of laundry or clothing items and detergent. In general, wash and dry with the warmest temperatures recommended on the label.  Clean all areas the individual has used often  Clean all touchable surfaces, such as counters, tabletops, doorknobs, bathroom fixtures, toilets, phones, keyboards, tablets, and bedside tables, every  day. Also, clean any surfaces that may have blood, body fluids, and/or secretions or excretions on them.  Wear gloves when cleaning surfaces the patient has come in contact with.  Use a diluted bleach solution (e.g., dilute bleach with 1 part bleach and 10 parts water) or a household disinfectant with a label that says EPA-registered for coronaviruses. To make a bleach solution at home, add 1 tablespoon of bleach to 1 quart (4 cups) of water. For a larger supply, add  cup of bleach to 1 gallon (16 cups) of water.  Read labels of cleaning products and follow recommendations provided on product labels. Labels contain instructions for safe and effective use of the cleaning product including precautions you should take when applying the product, such as wearing gloves or eye protection and making sure you have good ventilation during use of the product.  Remove gloves and wash hands immediately after cleaning.  Monitor yourself for signs and symptoms of illness Caregivers and household members are considered close contacts, should monitor their health, and will be asked to limit movement outside of the home to the extent possible. Follow the monitoring steps for close contacts listed on the symptom monitoring form.   ? If you have additional questions, contact your local health department or call the epidemiologist on call at [phone] (available 24/7). ? This guidance is subject to change. For the most up-to-date guidance from [facility], please refer to their website: [email or web address]
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
## 2020-03-07 ENCOUNTER — Encounter: Payer: Self-pay | Admitting: Licensed Clinical Social Worker

## 2020-03-07 ENCOUNTER — Other Ambulatory Visit: Payer: Self-pay

## 2020-03-07 ENCOUNTER — Ambulatory Visit (INDEPENDENT_AMBULATORY_CARE_PROVIDER_SITE_OTHER): Payer: Self-pay | Admitting: Licensed Clinical Social Worker

## 2020-03-07 DIAGNOSIS — F3131 Bipolar disorder, current episode depressed, mild: Secondary | ICD-10-CM

## 2020-03-07 DIAGNOSIS — F4310 Post-traumatic stress disorder, unspecified: Secondary | ICD-10-CM
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
## 2020-03-21 ENCOUNTER — Encounter: Payer: Self-pay | Admitting: Psychiatry

## 2020-03-21 ENCOUNTER — Telehealth (INDEPENDENT_AMBULATORY_CARE_PROVIDER_SITE_OTHER): Payer: Self-pay | Admitting: Psychiatry

## 2020-03-21 ENCOUNTER — Other Ambulatory Visit: Payer: Self-pay

## 2020-03-21 DIAGNOSIS — Z8659 Personal history of other mental and behavioral disorders: Secondary | ICD-10-CM

## 2020-03-21 DIAGNOSIS — F4010 Social phobia, unspecified: Secondary | ICD-10-CM

## 2020-03-21 DIAGNOSIS — F4310 Post-traumatic stress disorder, unspecified: Secondary | ICD-10-CM

## 2020-03-21 DIAGNOSIS — F17200 Nicotine dependence, unspecified, uncomplicated: Secondary | ICD-10-CM

## 2020-03-21 DIAGNOSIS — Z634 Disappearance and death of family member: Secondary | ICD-10-CM | POA: Insufficient documentation

## 2020-03-21 DIAGNOSIS — F410 Panic disorder [episodic paroxysmal anxiety] without agoraphobia: Secondary | ICD-10-CM

## 2020-03-21 DIAGNOSIS — F3131 Bipolar disorder, current episode depressed, mild: Secondary | ICD-10-CM

## 2020-03-21 DIAGNOSIS — R000 Tachycardia, unspecified: Secondary | ICD-10-CM

## 2020-03-21 MED ORDER — LORAZEPAM 0.5 MG PO TABS: 0.5000 mg | ORAL_TABLET | ORAL | 0 refills | Status: DC

## 2020-03-21 NOTE — Progress Notes (Unsigned)
Virtual Visit via Video Note  I connected with Mariah Gonzalez on 03/21/20 at  3:30 PM EST by a video enabled telemedicine application and verified that I am speaking with the correct person using two identifiers.  Location Provider Location : [facility] Patient Location : Home  Participants: Patient , Provider    I discussed the limitations of evaluation and management by telemedicine and the availability of in person appointments. The patient expressed understanding and agreed to proceed. I discussed the assessment and treatment plan with the patient. The patient was provided an opportunity to ask questions and all were answered. The patient agreed with the plan and demonstrated an understanding of the instructions.  The patient was advised to call back or seek an in-person evaluation if the symptoms worsen or if the condition fails to improve as anticipated.   BH MD OP Progress Note  03/21/2020 4:40 PM Mariah Gonzalez  MRN:  [id number]  Chief Complaint:  Chief Complaint    Follow-up     HPI: Mariah Gonzalez is a 30 year old Caucasian female, employed, lives in [place], has a history of bipolar disorder, PTSD, ADHD, dysfunctional uterine bleeding, sleep problems was evaluated by telemedicine today.  Patient reports she is currently struggling with a lot of stressors.  She reports a grandmother like figure passed away on 06/11/2022.  She reports she saw it coming since she was struggling with Alzheimer's disease however in spite of that it has been very difficult for her.  She reports her funeral is scheduled for tomorrow at the same church that she was kicked out from.  She reports she wants to attend this funeral since she continues to regret not attending the funeral of another person that she was close to in the past.  She however reports that her anxiety symptoms as getting worse since 2022/06/11.  She reports she had an episode on 06-11-2022 when she had heart palpitations, chest pain, felt  nervous and restless and so on.  She reports she checked her blood pressure and heart rate at that time and her heart rate was over 135 and her blood pressure was also elevated.  Patient reports she  took Ativan that she borrowed from a friend which kind of calmed her down and helped relieve her chest pain.  Patient reports however she did not recheck her blood pressure and heart rate since then.  Patient continues to feel nervous, restless and is in a constant state of panic.  She reports she has a lot going on at her work as well as at home right now and she is having trouble coping with all her stressors.  Patient reports sleep as okay.  Patient reports since 06-11-22 she is either hungry or not hungry and has been having some trouble with her appetite.  She however reports its likely due to her grief and also her anxiety symptoms.  Patient denies any suicidality, homicidality or perceptual disturbances.  Patient reports she is compliant on her medications, denies side effects.  Patient denies any other concerns today.  Visit Diagnosis:    ICD-10-CM   1. Bipolar 1 disorder, depressed, mild (HCC)  F31.31   2. PTSD (post-traumatic stress disorder)  F43.10   3. Social anxiety disorder  F40.10 LORazepam (ATIVAN) 0.5 MG tablet  4. Panic attacks  F41.0 LORazepam (ATIVAN) 0.5 MG tablet  5. Bereavement  Z63.4 LORazepam (ATIVAN) 0.5 MG tablet  6. Tobacco use disorder  F17.200   7. Tachycardia  R00.0   8.  History of ADHD  Z86.59     Past Psychiatric History: I have reviewed past psychiatric history from my progress note on 10/11/2019.  Past trials of Lamictal, Celexa, Adzenys XR  Past Medical History:  Past Medical History:  Diagnosis Date  . ADHD   . Anxiety   . Asthma   . Depressed   . DUB (dysfunctional uterine bleeding)   . IBS (irritable bowel syndrome)    History reviewed. No pertinent surgical history.  Family Psychiatric History: I have reviewed family psychiatric history from my  progress note on 10/11/2019.  Family History:  Family History  Problem Relation Age of Onset  . Hyperlipidemia Mother   . Hypertension Mother   . Depression Mother   . Hyperlipidemia Father   . Hypertension Father   . Mental illness Father   . Cancer Maternal Aunt   . Cancer Maternal Uncle   . Cancer Paternal Aunt   . Hearing loss Paternal Aunt   . Cancer Paternal Uncle   . Hearing loss Paternal Uncle     Social History: I have reviewed social history from my progress note on 10/11/2019. Social History   Socioeconomic History  . Marital status: Married    Spouse name: Not on file  . Number of children: Not on file  . Years of education: Not on file  . Highest education level: Not on file  Occupational History  . Not on file  Tobacco Use  . Smoking status: Current Every Day Smoker    Packs/day: 0.25    Types: Cigarettes  . Smokeless tobacco: Never Used  Vaping Use  . Vaping Use: Never used  Substance and Sexual Activity  . Alcohol use: No  . Drug use: Never  . Sexual activity: Yes  Other Topics Concern  . Not on file  Social History Narrative  . Not on file   Social Determinants of Health   Financial Resource Strain: Not on file  Food Insecurity: Not on file  Transportation Needs: Not on file  Physical Activity: Not on file  Stress: Not on file  Social Connections: Not on file    Allergies:  Allergies  Allergen Reactions  . Eggs Or Egg-Derived Products Diarrhea    Metabolic Disorder Labs: No results found for: HGBA1C, MPG No results found for: PROLACTIN Lab Results  Component Value Date   CHOL 214 (H) 01/31/2020   TRIG 193 (H) 01/31/2020   HDL 58 01/31/2020   LDLCALC 122 (H) 01/31/2020   LDLCALC 147 (H) 08/02/2019   Lab Results  Component Value Date   TSH 0.849 01/31/2020   TSH 1.180 08/02/2019    Therapeutic Level Labs: No results found for: LITHIUM No results found for: VALPROATE No components found for:  CBMZ  Current  Medications: Current Outpatient Medications  Medication Sig Dispense Refill  . LORazepam (ATIVAN) 0.5 MG tablet Take 1 tablet (0.5 mg total) by mouth as directed. Take 1 tablet 1-2 times a day as needed for severe anxiety attacks only - please limit use 21 tablet 0  . albuterol (VENTOLIN HFA) 108 (90 Base) MCG/ACT inhaler Inhale 2 puffs into the lungs every 6 (six) hours as needed for wheezing or shortness of breath. 8 g 1  . baclofen (LIORESAL) 10 MG tablet Take 0.5-1 tablets (5-10 mg total) by mouth 3 (three) times daily. 90 each 0  . benzonatate (TESSALON) 200 MG capsule Take 1 capsule (200 mg total) by mouth 2 (two) times daily as needed for cough. 20 capsule 0  .  cetirizine (ZYRTEC) 10 MG tablet Take 10 mg by mouth daily.    . chlorpheniramine-HYDROcodone (TUSSIONEX PENNKINETIC ER) 10-8 MG/5ML SUER Take 5 mLs by mouth every 12 (twelve) hours as needed. 50 mL 0  . cyclobenzaprine (FLEXERIL) 10 MG tablet Take 1 tablet (10 mg total) by mouth at bedtime. 30 tablet 2  . diphenhydramine-acetaminophen (TYLENOL PM) 25-500 MG TABS tablet Take 1 tablet by mouth at bedtime as needed.    . etonogestrel-ethinyl estradiol (NUVARING) 0.12-0.015 MG/24HR vaginal ring Place vaginally.    . lamoTRIgine (LAMICTAL) 200 MG tablet Take 1 tablet (200 mg total) by mouth daily. 90 tablet 0  . lamoTRIgine (LAMICTAL) 25 MG tablet Take 2 tablets (50 mg total) by mouth daily. To be combined with 200 mg daily at bedtime 180 tablet 0  . lidocaine (XYLOCAINE) 2 % jelly Apply topically.     Marland Kitchen MELATONIN PO Take by mouth.    Marland Kitchen omeprazole (PRILOSEC) 20 MG capsule Take 20 mg by mouth daily.    . predniSONE (DELTASONE) 50 MG tablet Take 1 tablet (50 mg total) by mouth daily with breakfast. 5 tablet 0  . traZODone (DESYREL) 50 MG tablet Take 1.5 tablets (75 mg total) by mouth at bedtime as needed for sleep. 45 tablet 1  . venlafaxine XR (EFFEXOR XR) 75 MG 24 hr capsule Take 1 capsule (75 mg total) by mouth daily with breakfast. 90  capsule 0  . Vitamin D, Ergocalciferol, (DRISDOL) 1.25 MG (50000 UNIT) CAPS capsule Take 50,000 Units by mouth once a week.     No current facility-administered medications for this visit.     Musculoskeletal: Strength & Muscle Tone: UTA Gait & Station: UTA Patient leans: N/A  Psychiatric Specialty Exam: Review of Systems  Cardiovascular: Positive for palpitations.  Psychiatric/Behavioral: The patient is nervous/anxious.        Grieving  All other systems reviewed and are negative.   There were no vitals taken for this visit.There is no height or weight on file to calculate BMI.  General Appearance: Casual  Eye Contact:  Fair  Speech:  Normal Rate  Volume:  Normal  Mood:  Anxious, panic attacks  Affect:  Congruent  Thought Process:  Goal Directed and Descriptions of Associations: Intact  Orientation:  Full (Time, Place, and Person)  Thought Content: Rumination   Suicidal Thoughts:  No  Homicidal Thoughts:  No  Memory:  Immediate;   Fair Recent;   Fair Remote;   Fair  Judgement:  Fair  Insight:  Fair  Psychomotor Activity:  Normal  Concentration:  Concentration: Fair and Attention Span: Fair  Recall:  [employer] of Knowledge: Fair  Language: Fair  Akathisia:  No  Handed:  Right  AIMS (if indicated):UTA  Assets:  Communication Skills Desire for Improvement Housing Social Support  ADL's:  Intact  Cognition: WNL  Sleep:  Fair   Screenings: GAD-7   Flowsheet Row Office Visit from 08/02/2019 in [facility]  Total GAD-7 Score 10    PHQ2-9   Flowsheet Row Video Visit from 03/21/2020 in [facility] Video Visit from 02/23/2020 in [facility] Office Visit from 01/31/2020 in [facility] Office Visit from 08/02/2019 in [facility]  PHQ-2 Total Score 2 2 6 2   PHQ-9 Total Score 6 13 16 11     Flowsheet Row Video Visit from 03/21/2020 in [facility]  C-SSRS RISK  CATEGORY No Risk       Assessment and Plan: Yahayra R  Lilian Kapur is a 30 year old Caucasian female, married, employed, has a history of bipolar disorder, sleep disorder, ADHD, PTSD DU B was evaluated by telemedicine today.  Patient is biologically predisposed given her history of trauma, her own health problems, family history.  Patient with multiple psychosocial stressors including recent death of her grandmother, job related stressors, situational stressors at home.  Patient with recent panic attacks, grief reaction will benefit from the following plan.  Plan Bipolar disorder type I depressed-improving Lamictal 250 mg p.o. daily Venlafaxine 75 mg p.o. daily  PTSD-improving Trazodone 75 mg p.o. nightly Venlafaxine as prescribed Continue CBT with Ms. Juanito Doom  Social anxiety disorder-unstable Continue venlafaxine as prescribed Continue CBT  Panic attacks-unstable Start lorazepam 0.5 mg-1-2 times a day as needed for severe panic attacks only.  Provided medication education.  Advised patient to limit use.  Discussed with patient not to borrow medications that belongs to another person, and not to change medications without consulting with her providers. Patient advised to talk to her therapist about her recent panic symptoms, she will continue to benefit from CBT  Bereavement-unstable Continue counseling.  Tobacco use disorder-improving Provided counseling, she is cutting back.  Patient with elevated heart rate and blood pressure reading recently, was advised to check her blood pressure and heart rate while in session today.  Patient's heart rate continues to be elevated at 125.  Patient advised to call her primary medical doctor today for an evaluation.  Patient also advised to go into an urgent care if she is unable to follow-up with her primary care provider.  Also provided information for crisis walk-in center-[facility] if she has another panic  attack or if she is in a crisis.  Follow-up in clinic in 2 weeks or sooner if needed.  I have spent atleast 30 minutes face to face by video with patient today. More than 50 % of the time was spent for preparing to see the patient ( e.g., review of test, records ), obtaining and to review and separately obtained history , ordering medications and test ,psychoeducation and supportive psychotherapy and care coordination,as well as documenting clinical information in electronic health record. This note was generated in part or whole with voice recognition software. Voice recognition is usually quite accurate but there are transcription errors that can and very often do occur. I apologize for any typographical errors that were not detected and corrected.      Jomarie Longs, MD 03/22/2020, 5:20 PM

## 2020-03-22 ENCOUNTER — Other Ambulatory Visit: Payer: Self-pay

## 2020-03-22 ENCOUNTER — Ambulatory Visit (INDEPENDENT_AMBULATORY_CARE_PROVIDER_SITE_OTHER): Payer: BC Managed Care – PPO | Admitting: Family Medicine

## 2020-03-22 ENCOUNTER — Encounter: Payer: Self-pay | Admitting: Family Medicine

## 2020-03-22 VITALS — BP 135/87 | HR 128 | Temp 98.3°F | Wt 231.4 lb

## 2020-03-22 DIAGNOSIS — Z862 Personal history of diseases of the blood and blood-forming organs and certain disorders involving the immune mechanism: Secondary | ICD-10-CM | POA: Diagnosis not present

## 2020-03-22 DIAGNOSIS — F4010 Social phobia, unspecified: Secondary | ICD-10-CM | POA: Diagnosis not present

## 2020-03-22 DIAGNOSIS — R000 Tachycardia, unspecified: Secondary | ICD-10-CM | POA: Diagnosis not present

## 2020-03-22 DIAGNOSIS — G43009 Migraine without aura, not intractable, without status migrainosus: Secondary | ICD-10-CM | POA: Diagnosis not present

## 2020-03-22 MED ORDER — KETOROLAC TROMETHAMINE 60 MG/2ML IM SOLN
60.0000 mg | Freq: Once | INTRAMUSCULAR | Status: AC
Start: 2020-03-22 — End: 2020-03-22
  Administered 2020-03-22: 60 mg via INTRAMUSCULAR

## 2020-03-22 NOTE — Progress Notes (Signed)
BP 135/87   Pulse (!) 128   Temp 98.3 F (36.8 C) (Oral)   Wt 231 lb 6.4 oz (105 kg)   SpO2 97%   BMI 43.01 kg/m    Subjective:    Patient ID: Mariah Gonzalez, female    DOB: 04/10/1990, 30 y.o.   MRN: [id number]  HPI: Mariah Gonzalez is a 30 y.o. female  Chief Complaint  Patient presents with  . Hypertension  . Palpitations    Pt states she is having racing heart since Friday and says she has pressure in her chest Friday, pt states she is currently Ativan to help with anxiety.    ANXIETY/STRESS- Has not been feeling well. She notes that she has a huge amount of stress right now. She has to go to her grandmother's funeral this afternoon and is supposed to see someone she hasn't seen in years and is triggering. She has been following with psychiatry and has been taking ativan at their direction, but she is still very anxious Duration: chronic, worse in the last week Status:exacerbated Anxious mood: yes  Excessive worrying: yes Irritability: yes  Sweating: no Nausea: no Palpitations:yes Hyperventilation: yes  Panic attacks: yes Agoraphobia: no  Obscessions/compulsions: no Depressed mood: yes Depression screen [facility] 2/9 02/23/2020 01/31/2020 08/02/2019  Decreased Interest 0 3 1  Down, Depressed, Hopeless 2 3 1   PHQ - 2 Score 2 6 2   Altered sleeping 3 3 2   Tired, decreased energy 3 3 2   Change in appetite 3 3 1   Feeling bad or failure about yourself  1 1 1   Trouble concentrating 1 0 1  Moving slowly or fidgety/restless 0 0 2  Suicidal thoughts 0 0 0  PHQ-9 Score 13 16 11   Difficult doing work/chores - Somewhat difficult Somewhat difficult  Some encounter information is confidential and restricted. Go to Review Flowsheets activity to see all data.   Anhedonia: no Weight changes: no Insomnia: no   Hypersomnia: no Fatigue/loss of energy: yes Feelings of worthlessness: yes Feelings of guilt: yes Impaired concentration/indecisiveness: yes Suicidal ideations: no   Crying spells: yes Recent Stressors/Life Changes: yes   Relationship problems: no   Family stress: yes     Financial stress: no    Job stress: no    Recent death/loss: yes  PALPITATIONS Duration: about a week Symptom description: chest pounding and skipping beats Duration of episode: minutes Frequency: recurrentl Activity when event occurred: at random Related to exertion: no Dyspnea: yes Chest pain: yes Syncope: no Anxiety/stress: yes Nausea/vomiting: yes Diaphoresis: no Coronary artery disease: no Congestive heart failure: no Arrhythmia:no Thyroid disease: no Status:  worse Treatments attempted:ativan  Has had a migraine all day. Has not been breaking  Relevant past medical, surgical, family and social history reviewed and updated as indicated. Interim medical history since our last visit reviewed. Allergies and medications reviewed and updated.  Review of Systems  Constitutional: Negative.   Respiratory: Negative.   Cardiovascular: Negative.   Gastrointestinal: Negative.   Musculoskeletal: Negative.   Skin: Negative.   Psychiatric/Behavioral: Positive for agitation, dysphoric mood and sleep disturbance. Negative for behavioral problems, confusion, decreased concentration, hallucinations, self-injury and suicidal ideas. The patient is nervous/anxious. The patient is not hyperactive.     Per HPI unless specifically indicated above     Objective:    BP 135/87   Pulse (!) 128   Temp 98.3 F (36.8 C) (Oral)   Wt 231 lb 6.4 oz (105 kg)   SpO2 97%   BMI  43.01 kg/m   Wt Readings from Last 3 Encounters:  03/22/20 231 lb 6.4 oz (105 kg)  01/31/20 224 lb 3.2 oz (101.7 kg)  10/13/19 205 lb (93 kg)    Physical Exam Vitals and nursing note reviewed.  Constitutional:      General: She is not in acute distress.    Appearance: Normal appearance. She is not ill-appearing ( ), toxic-appearing or diaphoretic.  HENT:     Head: Normocephalic and atraumatic.      Right Ear: External ear normal.     Left Ear: External ear normal.     Nose: Nose normal.     Mouth/Throat:     Mouth: Mucous membranes are moist.     Pharynx: Oropharynx is clear.  Eyes:     General: No scleral icterus.       Right eye: No discharge.        Left eye: No discharge.     Extraocular Movements: Extraocular movements intact.     Conjunctiva/sclera: Conjunctivae normal.     Pupils: Pupils are equal, round, and reactive to light.  Cardiovascular:     Rate and Rhythm: Normal rate and regular rhythm.     Pulses: Normal pulses.     Heart sounds: Normal heart sounds. No murmur heard. No friction rub. No gallop.   Pulmonary:     Effort: Pulmonary effort is normal. No respiratory distress.     Breath sounds: Normal breath sounds. No stridor. No wheezing, rhonchi or rales.  Chest:     Chest wall: No tenderness.  Musculoskeletal:        General: Normal range of motion.     Cervical back: Normal range of motion and neck supple.  Skin:    General: Skin is warm and dry.     Capillary Refill: Capillary refill takes less than 2 seconds.     Coloration: Skin is not jaundiced or pale.     Findings: No bruising, erythema, lesion or rash.  Neurological:     General: No focal deficit present.     Mental Status: She is alert and oriented to person, place, and time. Mental status is at baseline.  Psychiatric:        Mood and Affect: Mood normal.        Behavior: Behavior normal.        Thought Content: Thought content normal.        Judgment: Judgment normal.     Results for orders placed or performed in visit on 03/05/20  Novel Coronavirus, NAA ([facility])   Specimen: Nasopharyngeal(NP) swabs in vial transport medium  Result Value Ref Range   SARS-CoV-2, NAA Not Detected Not Detected  SARS-COV-2, NAA 2 DAY TAT  Result Value Ref Range   SARS-CoV-2, NAA 2 DAY TAT Performed       Assessment & Plan:   Problem List Items Addressed This Visit      Cardiovascular and  Mediastinum   Migraine    Will treat today with toradol. Call with any concerns. Continue to monitor. Call with any concerns.         Other   History of anemia    Rechecking labs today. Await results. Treat as needed.       Relevant Orders   Iron and TIBC   CBC with Differential/Platelet   Comprehensive metabolic panel   Ferritin   Social anxiety disorder    In acute exacerbation due to funeral. Continue to follow with psychiatry. Continue PRN ativan.  Call with any concerns.       Tachycardia - Primary    EKG normal. Likely due to anxiety. Will check labs. If not getting better following funeral. Call with any concerns. Continue to monitor.      Relevant Orders   EKG 12-Lead (Completed)   Iron and TIBC   CBC with Differential/Platelet   TSH   Comprehensive metabolic panel   Ferritin       Follow up plan: Return in about 4 weeks (around 04/19/2020).

## 2020-03-24 NOTE — Progress Notes (Signed)
Interpreted by me on 03/22/20. Sinus tachycardia at 114bpm, no ST segment changes

## 2020-03-25 ENCOUNTER — Encounter: Payer: Self-pay | Admitting: Family Medicine

## 2020-03-25 NOTE — Assessment & Plan Note (Signed)
EKG normal. Likely due to anxiety. Will check labs. If not getting better following funeral. Call with any concerns. Continue to monitor.

## 2020-03-25 NOTE — Assessment & Plan Note (Signed)
Rechecking labs today. Await results. Treat as needed.  °

## 2020-03-25 NOTE — Assessment & Plan Note (Signed)
In acute exacerbation due to funeral. Continue to follow with psychiatry. Continue PRN ativan. Call with any concerns.

## 2020-03-25 NOTE — Assessment & Plan Note (Signed)
Will treat today with toradol. Call with any concerns. Continue to monitor. Call with any concerns.

## 2020-03-26 ENCOUNTER — Other Ambulatory Visit: Payer: BC Managed Care – PPO

## 2020-03-26 ENCOUNTER — Other Ambulatory Visit: Payer: Self-pay

## 2020-03-26 DIAGNOSIS — R000 Tachycardia, unspecified: Secondary | ICD-10-CM

## 2020-03-26 DIAGNOSIS — Z862 Personal history of diseases of the blood and blood-forming organs and certain disorders involving the immune mechanism: Secondary | ICD-10-CM

## 2020-03-27 ENCOUNTER — Ambulatory Visit (INDEPENDENT_AMBULATORY_CARE_PROVIDER_SITE_OTHER): Payer: Self-pay | Admitting: Licensed Clinical Social Worker

## 2020-03-27 ENCOUNTER — Encounter: Payer: Self-pay | Admitting: Licensed Clinical Social Worker

## 2020-03-27 DIAGNOSIS — F410 Panic disorder [episodic paroxysmal anxiety] without agoraphobia: Secondary | ICD-10-CM

## 2020-03-27 DIAGNOSIS — F4310 Post-traumatic stress disorder, unspecified: Secondary | ICD-10-CM

## 2020-03-27 DIAGNOSIS — F4010 Social phobia, unspecified: Secondary | ICD-10-CM

## 2020-03-27 DIAGNOSIS — R000 Tachycardia, unspecified: Secondary | ICD-10-CM

## 2020-03-27 DIAGNOSIS — F3131 Bipolar disorder, current episode depressed, mild: Secondary | ICD-10-CM

## 2020-03-27 LAB — CBC WITH DIFFERENTIAL/PLATELET
Basophils Absolute: 0 10*3/uL (ref 0.0–0.2)
Basos: 1 %
EOS (ABSOLUTE): 0 10*3/uL (ref 0.0–0.4)
Eos: 0 %
Hematocrit: 43.8 % (ref 34.0–46.6)
Hemoglobin: 14.9 g/dL (ref 11.1–15.9)
Immature Grans (Abs): 0 10*3/uL (ref 0.0–0.1)
Immature Granulocytes: 0 %
Lymphocytes Absolute: 3.6 10*3/uL — ABNORMAL HIGH (ref 0.7–3.1)
Lymphs: 42 %
MCH: 29.5 pg (ref 26.6–33.0)
MCHC: 34 g/dL (ref 31.5–35.7)
MCV: 87 fL (ref 79–97)
Monocytes Absolute: 0.6 10*3/uL (ref 0.1–0.9)
Monocytes: 7 %
Neutrophils Absolute: 4.4 10*3/uL (ref 1.4–7.0)
Neutrophils: 50 %
Platelets: 397 10*3/uL (ref 150–450)
RBC: 5.05 x10E6/uL (ref 3.77–5.28)
RDW: 14 % (ref 11.7–15.4)
WBC: 8.7 10*3/uL (ref 3.4–10.8)

## 2020-03-27 LAB — IRON AND TIBC
Iron Saturation: 13 % — ABNORMAL LOW (ref 15–55)
Iron: 42 ug/dL (ref 27–159)
Total Iron Binding Capacity: 333 ug/dL (ref 250–450)
UIBC: 291 ug/dL (ref 131–425)

## 2020-03-27 LAB — COMPREHENSIVE METABOLIC PANEL
ALT: 23 IU/L (ref 0–32)
AST: 18 IU/L (ref 0–40)
Albumin/Globulin Ratio: 1.7 (ref 1.2–2.2)
Albumin: 4.5 g/dL (ref 3.9–5.0)
Alkaline Phosphatase: 89 IU/L (ref 44–121)
BUN/Creatinine Ratio: 24 — ABNORMAL HIGH (ref 9–23)
BUN: 16 mg/dL (ref 6–20)
Bilirubin Total: <0.2 mg/dL (ref 0.0–1.2)
CO2: 20 mmol/L (ref 20–29)
Calcium: 9.5 mg/dL (ref 8.7–10.2)
Chloride: 101 mmol/L (ref 96–106)
Creatinine, Ser: 0.67 mg/dL (ref 0.57–1.00)
GFR calc Af Amer: 139 mL/min/{1.73_m2} (ref >59)
GFR calc non Af Amer: 121 mL/min/{1.73_m2} (ref >59)
Globulin, Total: 2.7 g/dL (ref 1.5–4.5)
Glucose: 88 mg/dL (ref 65–99)
Potassium: 4.6 mmol/L (ref 3.5–5.2)
Sodium: 138 mmol/L (ref 134–144)
Total Protein: 7.2 g/dL (ref 6.0–8.5)

## 2020-03-27 LAB — TSH: TSH: 0.83 u[IU]/mL (ref 0.450–4.500)

## 2020-03-27 LAB — FERRITIN: Ferritin: 18 ng/mL (ref 15–150)

## 2020-03-27 NOTE — Progress Notes (Signed)
Virtual Visit via Video Note  I connected with [place] on 03/27/20 at  3:00 PM EST by a video enabled telemedicine application and verified that I am speaking with the correct person using two identifiers.  Participating Parties Patient Provider  Location: Patient: Home Provider: Home Office   I discussed the limitations of evaluation and management by telemedicine and the availability of in person appointments. The patient expressed understanding and agreed to proceed.  THERAPY PROGRESS NOTE  Session Time: 23 Minutes  Participation Level: Active  Behavioral Response: CasualAlertAnxious  Type of Therapy: Individual Therapy  Treatment Goals addressed: Anxiety and Coping  Interventions: CBT  Summary: Mariah Gonzalez is a 30 y.o. female who presents with depression and anxiety sxs. Pt reported experiencing panic attacks, including tachycardia, high blood pressure readings and light headedness in the past week. Pt reported she tried to use grounding techniques, however "none of it worked". Pt followed up with physician for EKG and bloodwork and has been taking Ativan. Pt reported usually she can "ground myself" with certain rituals, however acknowledged that having several unexpected stressors in the past week up until today likely contributed to more intense anxiety sxs. Pt attended funeral last week of someone "who was like a grandmother to me". Pt and spouse are also thinking about providing some respite care for a teenage boy that is having issues with legal guardian. Pt reported she is having dinner with guardians this evening. Pt to follow up with billing department to update insurance information.  Suicidal/Homicidal: No  Therapist Response: Therapist met with patient for follow up. Therapist and patient reviewed homework assignment. Therapist and patient explored increase in sxs, use of coping skills and new stressors.  Plan: Return again in 2  weeks.  Diagnosis: Axis I: Bipolar, Depressed, Post Traumatic Stress Disorder, Social Anxiety and Panic Attacks, Tachycardia    Axis II: N/A  [name], LCSW, LCAS 03/27/2020

## 2020-04-05 ENCOUNTER — Other Ambulatory Visit: Payer: Self-pay

## 2020-04-05 ENCOUNTER — Telehealth: Payer: BC Managed Care – PPO | Admitting: Psychiatry

## 2020-04-11 ENCOUNTER — Ambulatory Visit: Payer: Self-pay | Admitting: Licensed Clinical Social Worker

## 2020-04-19 ENCOUNTER — Ambulatory Visit (INDEPENDENT_AMBULATORY_CARE_PROVIDER_SITE_OTHER): Payer: BC Managed Care – PPO | Admitting: Licensed Clinical Social Worker

## 2020-04-19 ENCOUNTER — Other Ambulatory Visit: Payer: Self-pay

## 2020-04-19 DIAGNOSIS — F4310 Post-traumatic stress disorder, unspecified: Secondary | ICD-10-CM

## 2020-04-19 DIAGNOSIS — F410 Panic disorder [episodic paroxysmal anxiety] without agoraphobia: Secondary | ICD-10-CM

## 2020-04-19 DIAGNOSIS — F3131 Bipolar disorder, current episode depressed, mild: Secondary | ICD-10-CM

## 2020-04-19 DIAGNOSIS — F4010 Social phobia, unspecified: Secondary | ICD-10-CM | POA: Diagnosis not present

## 2020-04-19 NOTE — Progress Notes (Signed)
Virtual Visit via Telephone Note  I connected with Mariah Gonzalez on 04/19/20 at  3:00 PM EST by telephone and verified that I am speaking with the correct person using two identifiers.  Participating Parties Patient Provider  Location: Patient: Vehicle at [facility]: Home Office   I discussed the limitations, risks, security and privacy concerns of performing an evaluation and management service by telephone and the availability of in person appointments. I also discussed with the patient that there may be a patient responsible charge related to this service. The patient expressed understanding and agreed to proceed.  THERAPY PROGRESS NOTE  Session Time: 30 Minutes  Participation Level: Active  Behavioral Response: AlertAnxious  Type of Therapy: Individual Therapy  Treatment Goals addressed: Anxiety and Coping  Interventions: CBT  Summary: CALISTA CRAIN is a 30 y.o. female who presents with depression and anxiety sxs. Pt reported "we ultimately decided not to offer respite care" for young teen she had mentioned last session. Pt reported "I have shingles" and believes this is from accumulation of stressors. Pt reported she and spouse both "had a meltdown". Pt reported she fell at work about one week ago and had to use worker's compensation. Pt reported "I keep forgetting about rescheduling" last missed session with psychiatrist. "I really want to talk about ADHD medication". Pt reported she is often forgetting important things and feeling "tired all day long". Pt reported feeling "mentally exhausted and stemming more than usual". Pt acknowledged "for last 2 years straight I have worked without a break". 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 Pt acknowledged strengths s/ "I am a safe space for them".   Suicidal/Homicidal: No  Therapist Response: Therapist met with patient for  follow up. Therapist and patient reviewed progress towards treatment plan goas and continued barriers for 3 month update. Therapist and patient processed feelings, thoughts and reactions to managing stressors.  Plan: Return again in 2 weeks.  Diagnosis: Axis I: Bipolar, Depressed, Post Traumatic Stress Disorder, Social Anxiety and Panic Attacks    Axis II: N/A  [name], LCSW, LCAS 04/19/2020

## 2020-04-20 ENCOUNTER — Encounter: Payer: Self-pay | Admitting: Licensed Clinical Social Worker

## 2020-04-25 ENCOUNTER — Encounter: Payer: Self-pay | Admitting: Psychiatry

## 2020-04-25 ENCOUNTER — Telehealth: Payer: Self-pay

## 2020-04-25 ENCOUNTER — Other Ambulatory Visit: Payer: Self-pay

## 2020-04-25 ENCOUNTER — Telehealth (INDEPENDENT_AMBULATORY_CARE_PROVIDER_SITE_OTHER): Payer: BC Managed Care – PPO | Admitting: Psychiatry

## 2020-04-25 ENCOUNTER — Other Ambulatory Visit: Payer: Self-pay | Admitting: Psychiatry

## 2020-04-25 DIAGNOSIS — F3131 Bipolar disorder, current episode depressed, mild: Secondary | ICD-10-CM | POA: Diagnosis not present

## 2020-04-25 DIAGNOSIS — F4310 Post-traumatic stress disorder, unspecified: Secondary | ICD-10-CM | POA: Diagnosis not present

## 2020-04-25 DIAGNOSIS — Z79899 Other long term (current) drug therapy: Secondary | ICD-10-CM | POA: Insufficient documentation

## 2020-04-25 DIAGNOSIS — F410 Panic disorder [episodic paroxysmal anxiety] without agoraphobia: Secondary | ICD-10-CM

## 2020-04-25 DIAGNOSIS — F4010 Social phobia, unspecified: Secondary | ICD-10-CM

## 2020-04-25 DIAGNOSIS — Z634 Disappearance and death of family member: Secondary | ICD-10-CM

## 2020-04-25 DIAGNOSIS — F17200 Nicotine dependence, unspecified, uncomplicated: Secondary | ICD-10-CM

## 2020-04-25 NOTE — Telephone Encounter (Signed)
labwork orders for a urine drug test  dx: z79.899 was faxed and confirmed to the [facility] lab

## 2020-04-25 NOTE — Progress Notes (Signed)
Virtual Visit via Video Note  I connected with Mariah Gonzalez on 04/25/20 at 11:00 AM EDT by a video enabled telemedicine application and verified that I am speaking with the correct person using two identifiers.  Location Provider Location : [facility] Patient Location : Work  Participants: Patient , Provider   I discussed the limitations of evaluation and management by telemedicine and the availability of in person appointments. The patient expressed understanding and agreed to proceed.    I discussed the assessment and treatment plan with the patient. The patient was provided an opportunity to ask questions and all were answered. The patient agreed with the plan and demonstrated an understanding of the instructions.   The patient was advised to call back or seek an in-person evaluation if the symptoms worsen or if the condition fails to improve as anticipated.   BH MD OP Progress Note  04/25/2020 1:43 PM Mariah Gonzalez  MRN:  [id number]  Chief Complaint:  Chief Complaint    Follow-up; Anxiety     HPI: Mariah Gonzalez is a 30 year old Caucasian female, employed, lives in [place], has a history of bipolar disorder, PTSD, ADHD, dysfunctional uterine bleeding, sleep problems was evaluated by telemedicine today.  Patient today reports she is currently struggling with concentration problems.  She reports she is unable to stay on one task at a time.  She feels as though she is all over the place.  She reports this is causing problems at her work.  She has been making careless mistakes.  She is taking more time to complete work.  She does have a history of ADHD per report.  She reports she was tested at [facility] in the past and was on medications in the past.  Patient reports she was able to go for the funeral of her grandmother as reported last visit.  She reports it went well,better than she expected.  Patient denies any suicidality, homicidality or  perceptual disturbances.  She is compliant on her medications.  She does not know how much the venlafaxine is helpful.  She feels as though the venlafaxine could be making some of her anxiety symptoms worse.  Patient denies any other concerns today.  Visit Diagnosis:    ICD-10-CM   1. Bipolar 1 disorder, depressed, mild (HCC)  F31.31   2. PTSD (post-traumatic stress disorder)  F43.10   3. Social anxiety disorder  F40.10   4. Panic attacks  F41.0   5. Bereavement  Z63.4   6. Tobacco use disorder  F17.200   7. High risk medication use  Z79.899 Urine drugs of abuse scrn w alc, routine (Ref Lab)    Past Psychiatric History: I have reviewed past psychiatric history from my progress note on 10/11/2019.  Past trials of Lamictal, Celexa,Adzenys XR  Past Medical History:  Past Medical History:  Diagnosis Date  . ADHD   . Anxiety   . Asthma   . Depressed   . DUB (dysfunctional uterine bleeding)   . IBS (irritable bowel syndrome)    History reviewed. No pertinent surgical history.  Family Psychiatric History: I have reviewed family psychiatric history from my progress note on 10/11/2019  Family History:  Family History  Problem Relation Age of Onset  . Hyperlipidemia Mother   . Hypertension Mother   . Depression Mother   . Hyperlipidemia Father   . Hypertension Father   . Mental illness Father   . Cancer Maternal Aunt   . Cancer Maternal Uncle   .  Cancer Paternal Aunt   . Hearing loss Paternal Aunt   . Cancer Paternal Uncle   . Hearing loss Paternal Uncle     Social History: I have reviewed social history from my progress note on 10/11/2019 Social History   Socioeconomic History  . Marital status: Married    Spouse name: Not on file  . Number of children: Not on file  . Years of education: Not on file  . Highest education level: Not on file  Occupational History  . Not on file  Tobacco Use  . Smoking status: Current Every Day Smoker    Packs/day: 0.25    Types:  Cigarettes  . Smokeless tobacco: Never Used  Vaping Use  . Vaping Use: Never used  Substance and Sexual Activity  . Alcohol use: No  . Drug use: Never  . Sexual activity: Yes  Other Topics Concern  . Not on file  Social History Narrative  . Not on file   Social Determinants of Health   Financial Resource Strain: Not on file  Food Insecurity: Not on file  Transportation Needs: Not on file  Physical Activity: Not on file  Stress: Not on file  Social Connections: Not on file    Allergies:  Allergies  Allergen Reactions  . Eggs Or Egg-Derived Products Diarrhea    Metabolic Disorder Labs: No results found for: HGBA1C, MPG No results found for: PROLACTIN Lab Results  Component Value Date   CHOL 214 (H) 01/31/2020   TRIG 193 (H) 01/31/2020   HDL 58 01/31/2020   LDLCALC 122 (H) 01/31/2020   LDLCALC 147 (H) 08/02/2019   Lab Results  Component Value Date   TSH 0.830 03/26/2020   TSH 0.849 01/31/2020    Therapeutic Level Labs: No results found for: LITHIUM No results found for: VALPROATE No components found for:  CBMZ  Current Medications: Current Outpatient Medications  Medication Sig Dispense Refill  . albuterol (VENTOLIN HFA) 108 (90 Base) MCG/ACT inhaler Inhale 2 puffs into the lungs every 6 (six) hours as needed for wheezing or shortness of breath. 8 g 1  . baclofen (LIORESAL) 10 MG tablet Take 0.5-1 tablets (5-10 mg total) by mouth 3 (three) times daily. 90 each 0  . benzonatate (TESSALON) 200 MG capsule Take 1 capsule (200 mg total) by mouth 2 (two) times daily as needed for cough. 20 capsule 0  . cetirizine (ZYRTEC) 10 MG tablet Take 10 mg by mouth daily.    . chlorpheniramine-HYDROcodone (TUSSIONEX PENNKINETIC ER) 10-8 MG/5ML SUER Take 5 mLs by mouth every 12 (twelve) hours as needed. 50 mL 0  . cyclobenzaprine (FLEXERIL) 10 MG tablet Take 1 tablet (10 mg total) by mouth at bedtime. 30 tablet 2  . diphenhydramine-acetaminophen (TYLENOL PM) 25-500 MG TABS  tablet Take 1 tablet by mouth at bedtime as needed.    . etonogestrel-ethinyl estradiol (NUVARING) 0.12-0.015 MG/24HR vaginal ring Place vaginally.    . lamoTRIgine (LAMICTAL) 200 MG tablet TAKE 1 TABLET(200 MG) BY MOUTH DAILY 90 tablet 0  . lamoTRIgine (LAMICTAL) 25 MG tablet TAKE 2 TABLETS BY MOUTH EVERY DAY. TO BE COMBINED WITH 200MG  EVERY DAY AT BEDTIME 180 tablet 0  . lidocaine (XYLOCAINE) 2 % jelly Apply topically.     . lidocaine (XYLOCAINE) 2 % solution SMARTSIG:Milliliter(s) Topical Daily PRN    . LORazepam (ATIVAN) 0.5 MG tablet Take 1 tablet (0.5 mg total) by mouth as directed. Take 1 tablet 1-2 times a day as needed for severe anxiety attacks only - please  limit use 21 tablet 0  . MELATONIN PO Take by mouth.    Marland Kitchen omeprazole (PRILOSEC) 20 MG capsule Take 20 mg by mouth daily.    . predniSONE (DELTASONE) 50 MG tablet Take 1 tablet (50 mg total) by mouth daily with breakfast. 5 tablet 0  . traZODone (DESYREL) 50 MG tablet Take 1.5 tablets (75 mg total) by mouth at bedtime as needed for sleep. 45 tablet 1  . valACYclovir (VALTREX) 1000 MG tablet Take 1,000 mg by mouth 3 (three) times daily.    Marland Kitchen venlafaxine XR (EFFEXOR-XR) 75 MG 24 hr capsule TAKE 1 CAPSULE(75 MG) BY MOUTH DAILY WITH BREAKFAST 90 capsule 0  . Vitamin D, Ergocalciferol, (DRISDOL) 1.25 MG (50000 UNIT) CAPS capsule Take 50,000 Units by mouth once a week.     No current facility-administered medications for this visit.     Musculoskeletal: Strength & Muscle Tone: UTA Gait & Station: UTA Patient leans: N/A  Psychiatric Specialty Exam: Review of Systems  Psychiatric/Behavioral: Positive for decreased concentration. The patient is nervous/anxious.   All other systems reviewed and are negative.   There were no vitals taken for this visit.There is no height or weight on file to calculate BMI.  General Appearance: Casual  Eye Contact:  Fair  Speech:  Clear and Coherent  Volume:  Normal  Mood:  Anxious  Affect:   Congruent  Thought Process:  Goal Directed and Descriptions of Associations: Circumstantial  Orientation:  Full (Time, Place, and Person)  Thought Content: Logical   Suicidal Thoughts:  No  Homicidal Thoughts:  No  Memory:  Immediate;   Fair Recent;   Fair Remote;   Fair  Judgement:  Good  Insight:  Fair  Psychomotor Activity:  Normal  Concentration:  Concentration: Fair and Attention Span: Fair  Recall:  [employer] of Knowledge: Fair  Language: Fair  Akathisia:  No  Handed:  Right  AIMS (if indicated): UTA  Assets:  Communication Skills Desire for Improvement Housing Social Support Talents/Skills Transportation Vocational/Educational  ADL's:  Intact  Cognition: WNL  Sleep:  Fair   Screenings: GAD-7   Flowsheet Row Office Visit from 08/02/2019 in [facility]  Total GAD-7 Score 10    PHQ2-9   Flowsheet Row Video Visit from 03/21/2020 in [facility] Video Visit from 02/23/2020 in [facility] Office Visit from 01[facility]31/2020 in [facility] Office Visit from 08/02/2019 in [facility]  PHQ-2 Total Score 2 2 6 2   PHQ-9 Total Score 6 13 16 11     Flowsheet Row Video Visit from 03/21/2020 in [facility]  C-SSRS RISK CATEGORY No Risk       Assessment and Plan: Mariah Gonzalez is a 30 year old Caucasian female, married, employed, has a history of bipolar disorder, sleep disorder, ADHD, PTSD, DUB was evaluated by telemedicine today.  Patient is biologically predisposed given her history of trauma, her own health problems, family history.  Patient with multiple psychosocial stressors including recent death of her grandmother, job related stressors, situational stressors at home.  Patient continues to struggle with attention problems, possible side effects to venlafaxine and worsening anxiety.  She will benefit from the following plan.  Plan Bipolar disorder type I  depressed-unstable Lamictal 250 mg p.o. daily Venlafaxine 75 mg p.o. daily  PTSD-unstable Trazodone 75 mg p.o. nightly Venlafaxine as prescribed Continue CBT with Ms. Shelda Pal  Social anxiety disorder-unstable Continue venlafaxine as prescribed Continue CBT  Panic attacks-some progress Lorazepam 0.5 mg  1-2 times as needed for severe panic attacks only. Patient to limit use. Patient to continue cognitive behavioral therapy.  Tobacco use disorder-improving Monitor closely  History of ADHD-patient advised to sign a release to obtain medical records from previous provider.  Also discussed with her that we need blood pressure and heart rate evaluation prior to starting a stimulant medication.  Patient advised to come into the office for the same.  High risk medication use-will order UDS.  [facility].  Patient to get it done.  After I review her UDS as well as blood pressure and heart rate as well as medical records from her previous ADHD provider, will contact patient to start a medication for ADHD as needed.  This was discussed with patient and she is agreeable.    Follow-up in clinic in 2 to 3 weeks or sooner if needed.  Patient to call back to schedule an appointment.  This note was generated in part or whole with voice recognition software. Voice recognition is usually quite accurate but there are transcription errors that can and very often do occur. I apologize for any typographical errors that were not detected and corrected.       Jomarie LongsSaramma Haston Casebolt, MD 04/26/2020, 8:27 AM

## 2020-04-30 ENCOUNTER — Encounter: Payer: Self-pay | Admitting: Licensed Clinical Social Worker

## 2020-04-30 ENCOUNTER — Other Ambulatory Visit: Payer: Self-pay

## 2020-04-30 ENCOUNTER — Ambulatory Visit (INDEPENDENT_AMBULATORY_CARE_PROVIDER_SITE_OTHER): Payer: BC Managed Care – PPO | Admitting: Licensed Clinical Social Worker

## 2020-04-30 DIAGNOSIS — F4310 Post-traumatic stress disorder, unspecified: Secondary | ICD-10-CM | POA: Diagnosis not present

## 2020-04-30 DIAGNOSIS — F4010 Social phobia, unspecified: Secondary | ICD-10-CM

## 2020-04-30 DIAGNOSIS — F3131 Bipolar disorder, current episode depressed, mild: Secondary | ICD-10-CM | POA: Diagnosis not present

## 2020-04-30 DIAGNOSIS — F410 Panic disorder [episodic paroxysmal anxiety] without agoraphobia: Secondary | ICD-10-CM

## 2020-04-30 NOTE — Progress Notes (Signed)
Virtual Visit via Telephone Note  I connected with Mariah Gonzalez on 04/30/20 at  3:00 PM EDT by telephone and verified that I am speaking with the correct person using two identifiers.  Participating Parties Patient Provider  Location: Patient: Vehicle from Worksite to Home Provider: Home Office   I discussed the limitations, risks, security and privacy concerns of performing an evaluation and management service by telephone and the availability of in person appointments. I also discussed with the patient that there may be a patient responsible charge related to this service. The patient expressed understanding and agreed to proceed.  THERAPY PROGRESS NOTE  Session Time: 30 Minutes  Participation Level: Active  Behavioral Response: AlertAnxious  Type of Therapy: Individual Therapy  Treatment Goals addressed: Anxiety and Coping  Interventions: CBT  Summary: Mariah Gonzalez is a 30 y.o. female who presents with depression and anxiety sxs. Pt reported while her shingles cleared up she continues to experience other medical/health related issues impacting her anxiety and stress. Pt reported she is in process of working with psychiatrist to be "put on Adderall" to help improve focus and concentration. Pt acknowledged feelings of sadness around this therapist leaving and hx of therapy rapport building.  Suicidal/Homicidal: No  Therapist Response: Therapist met with patient for follow up. Therapist and patient reviewed sxs, current stressors and attempts to cope. Therapist provided feedback around addressing concerns with providers and communicating needs. Pt was receptive. Therapist informed patient regarding initial phase of terminating services with this therapist due to leaving the practice in the next 5 weeks. Therapist and patient explored options for continued care.  Plan: Return again in 2 weeks.  Diagnosis: Axis I: Bipolar, Depressed, Post Traumatic Stress Disorder,  Social Anxiety and Panic Attacks    Axis II: N/A  [name], LCSW, LCAS 04/30/2020

## 2020-05-02 ENCOUNTER — Encounter: Payer: Self-pay | Admitting: Family Medicine

## 2020-05-03 ENCOUNTER — Encounter: Payer: Self-pay | Admitting: Family Medicine

## 2020-05-03 ENCOUNTER — Ambulatory Visit
Admission: RE | Admit: 2020-05-03 | Discharge: 2020-05-03 | Disposition: A | Discharge status: Home / Self Care | Payer: BC Managed Care – PPO | Source: Ambulatory Visit | Attending: Family Medicine | Admitting: Family Medicine

## 2020-05-03 ENCOUNTER — Other Ambulatory Visit: Payer: Self-pay

## 2020-05-03 ENCOUNTER — Telehealth (INDEPENDENT_AMBULATORY_CARE_PROVIDER_SITE_OTHER): Payer: BC Managed Care – PPO | Admitting: Family Medicine

## 2020-05-03 ENCOUNTER — Ambulatory Visit
Admission: RE | Admit: 2020-05-03 | Discharge: 2020-05-03 | Disposition: A | Discharge status: Home / Self Care | Payer: BC Managed Care – PPO | Attending: Family Medicine | Admitting: Family Medicine

## 2020-05-03 VITALS — Temp 98.5°F

## 2020-05-03 DIAGNOSIS — R059 Cough, unspecified: Secondary | ICD-10-CM

## 2020-05-03 DIAGNOSIS — J351 Hypertrophy of tonsils: Secondary | ICD-10-CM | POA: Diagnosis not present

## 2020-05-03 MED ORDER — PREDNISONE 10 MG PO TABS: ORAL_TABLET | ORAL | 0 refills | Status: DC

## 2020-05-03 MED ORDER — DOXYCYCLINE HYCLATE 100 MG PO TABS: 100.0000 mg | ORAL_TABLET | Freq: Two times a day (BID) | ORAL | 0 refills | Status: DC

## 2020-05-03 NOTE — Progress Notes (Signed)
Temp 98.5 F (36.9 C)    Subjective:    Patient ID: Mariah Gonzalez, female    DOB: 1991/01/19, 30 y.o.   MRN: [id number]  HPI: Mariah Gonzalez is a 29 y.o. female  Chief Complaint  Patient presents with  . Cough    Patient states she is coughing and wheezing, runny nose, headache. Patient has been sick for about a week and half.    UPPER RESPIRATORY TRACT INFECTION Duration: about a week Worst symptom: cough Fever: no Cough: yes Shortness of breath: yes Wheezing: yes Chest pain: no Chest tightness: yes Chest congestion: yes Nasal congestion: yes Runny nose: yes Post nasal drip: yes Sneezing: no Sore throat: no Swollen glands: yes Sinus pressure: no Headache: yes Face pain: yes Toothache: no Ear pain: yes  Ear pressure: yes  Eyes red/itching:no Eye drainage/crusting: yes  Vomiting: yes Rash: no Fatigue: yes Sick contacts: yes Strep contacts: no  Context: worse Recurrent sinusitis: no Relief with OTC cold/cough medications: no  Treatments attempted: tessalon, dayquil, nyquil, sudafed    Relevant past medical, surgical, family and social history reviewed and updated as indicated. Interim medical history since our last visit reviewed. Allergies and medications reviewed and updated.  Review of Systems  Constitutional: Negative.   HENT: Positive for congestion, postnasal drip and rhinorrhea. Negative for dental problem, drooling, ear discharge, ear pain, facial swelling, hearing loss, mouth sores, nosebleeds, sinus pressure, sinus pain, sneezing, sore throat, tinnitus, trouble swallowing and voice change.   Respiratory: Positive for cough, shortness of breath and stridor. Negative for apnea, choking, chest tightness and wheezing.   Cardiovascular: Negative.   Gastrointestinal: Negative.   Neurological: Negative.   Psychiatric/Behavioral: Negative.     Per HPI unless specifically indicated above     Objective:    Temp 98.5 F (36.9 C)   Wt  Readings from Last 3 Encounters:  03/22/20 231 lb 6.4 oz (105 kg)  01/31/20 224 lb 3.2 oz (101.7 kg)  10/13/19 205 lb (93 kg)    Physical Exam Vitals and nursing note reviewed.  Constitutional:      General: She is not in acute distress.    Appearance: Normal appearance. She is not ill-appearing, toxic-appearing or diaphoretic.  HENT:     Head: Normocephalic and atraumatic.     Right Ear: External ear normal.     Left Ear: External ear normal.     Nose: Nose normal.     Mouth/Throat:     Mouth: Mucous membranes are moist.     Pharynx: Oropharynx is clear.  Eyes:     General: No scleral icterus.       Right eye: No discharge.        Left eye: No discharge.     Conjunctiva/sclera: Conjunctivae normal.     Pupils: Pupils are equal, round, and reactive to light.  Pulmonary:     Effort: Pulmonary effort is normal. No respiratory distress.     Comments: Speaking in full sentences, deep cough with stridor several times Musculoskeletal:        General: Normal range of motion.     Cervical back: Normal range of motion.  Skin:    Coloration: Skin is not jaundiced or pale.     Findings: No bruising, erythema, lesion or rash.  Neurological:     Mental Status: She is alert and oriented to person, place, and time. Mental status is at baseline.  Psychiatric:        Mood and Affect: Mood  normal.        Behavior: Behavior normal.        Thought Content: Thought content normal.        Judgment: Judgment normal.     Results for orders placed or performed in visit on 03/26/20  Ferritin  Result Value Ref Range   Ferritin 18 15 - 150 ng/mL  Comprehensive metabolic panel  Result Value Ref Range   Glucose 88 65 - 99 mg/dL   BUN 16 6 - 20 mg/dL   Creatinine, Ser 7.90 0.57 - 1.00 mg/dL   GFR calc non Af Amer 121 >59 mL/min/1.73   GFR calc Af Amer 139 >59 mL/min/1.73   BUN/Creatinine Ratio 24 (H) 9 - 23   Sodium 138 134 - 144 mmol/L   Potassium 4.6 3.5 - 5.2 mmol/L   Chloride 101 96 -  106 mmol/L   CO2 20 20 - 29 mmol/L   Calcium 9.5 8.7 - 10.2 mg/dL   Total Protein 7.2 6.0 - 8.5 g/dL   Albumin 4.5 3.9 - 5.0 g/dL   Globulin, Total 2.7 1.5 - 4.5 g/dL   Albumin/Globulin Ratio 1.7 1.2 - 2.2   Bilirubin Total <0.2 0.0 - 1.2 mg/dL   Alkaline Phosphatase 89 44 - 121 IU/L   AST 18 0 - 40 IU/L   ALT 23 0 - 32 IU/L  TSH  Result Value Ref Range   TSH 0.830 0.450 - 4.500 uIU/mL  CBC with Differential/Platelet  Result Value Ref Range   WBC 8.7 3.4 - 10.8 x10E3/uL   RBC 5.05 3.77 - 5.28 x10E6/uL   Hemoglobin 14.9 11.1 - 15.9 g/dL   Hematocrit 24.0 97.3 - 46.6 %   MCV 87 79 - 97 fL   MCH 29.5 26.6 - 33.0 pg   MCHC 34.0 31.5 - 35.7 g/dL   RDW 53.2 99.2 - 42.6 %   Platelets 397 150 - 450 x10E3/uL   Neutrophils 50 Not Estab. %   Lymphs 42 Not Estab. %   Monocytes 7 Not Estab. %   Eos 0 Not Estab. %   Basos 1 Not Estab. %   Neutrophils Absolute 4.4 1.4 - 7.0 x10E3/uL   Lymphocytes Absolute 3.6 (H) 0.7 - 3.1 x10E3/uL   Monocytes Absolute 0.6 0.1 - 0.9 x10E3/uL   EOS (ABSOLUTE) 0.0 0.0 - 0.4 x10E3/uL   Basophils Absolute 0.0 0.0 - 0.2 x10E3/uL   Immature Granulocytes 0 Not Estab. %   Immature Grans (Abs) 0.0 0.0 - 0.1 x10E3/uL  Iron and TIBC  Result Value Ref Range   Total Iron Binding Capacity 333 250 - 450 ug/dL   UIBC 834 196 - 222 ug/dL   Iron 42 27 - 979 ug/dL   Iron Saturation 13 (L) 15 - 55 %      Assessment & Plan:   Problem List Items Addressed This Visit   None   Visit Diagnoses    Cough    -  Primary   Concern for sinusitis- will get her treated with prednisone and doxycycline and recheck 1 week. CXR. Cleda Daub next visit.    Relevant Orders   DG Chest 2 View   Enlarged tonsils       Will get her into ENT. Referral generated today. Call with any concerns.    Relevant Orders   Ambulatory referral to ENT       Follow up plan: Return in about 1 week (around 05/10/2020) for with spiro.   . This visit was completed via MyChart  due to the restrictions  of the COVID-19 pandemic. All issues as above were discussed and addressed. Physical exam was done as above through visual confirmation on MyChart. If it was felt that the patient should be evaluated in the office, they were directed there. The patient verbally consented to this visit. . Location of the patient: home . Location of the provider: work . Those involved with this call:  . Provider: Olevia Perches, DO . CMA: Rolley Sims, CMA . Front Desk/Registration: Harriet Pho  . Time spent on call: 15 minutes with patient face to face via video conference. More than 50% of this time was spent in counseling and coordination of care. 23 minutes total spent in review of patient's record and preparation of their chart.

## 2020-05-04 ENCOUNTER — Telehealth: Payer: Self-pay

## 2020-05-04 ENCOUNTER — Encounter: Payer: Self-pay | Admitting: Family Medicine

## 2020-05-04 NOTE — Telephone Encounter (Signed)
Lvm to make this apt. 

## 2020-05-04 NOTE — Telephone Encounter (Signed)
-----   Message from Dorcas Carrow, DO sent at 05/03/2020  3:57 PM EDT ----- 1 week in person with spiro

## 2020-05-11 ENCOUNTER — Other Ambulatory Visit: Payer: Self-pay | Admitting: Psychiatry

## 2020-05-11 ENCOUNTER — Other Ambulatory Visit: Payer: Self-pay

## 2020-05-11 ENCOUNTER — Ambulatory Visit (INDEPENDENT_AMBULATORY_CARE_PROVIDER_SITE_OTHER): Payer: BC Managed Care – PPO | Admitting: Family Medicine

## 2020-05-11 ENCOUNTER — Encounter: Payer: Self-pay | Admitting: Family Medicine

## 2020-05-11 VITALS — BP 143/96 | HR 105 | Temp 98.4°F | Wt 239.2 lb

## 2020-05-11 DIAGNOSIS — R062 Wheezing: Secondary | ICD-10-CM

## 2020-05-11 DIAGNOSIS — F4310 Post-traumatic stress disorder, unspecified: Secondary | ICD-10-CM

## 2020-05-11 DIAGNOSIS — J029 Acute pharyngitis, unspecified: Secondary | ICD-10-CM

## 2020-05-11 MED ORDER — ALBUTEROL SULFATE (2.5 MG/3ML) 0.083% IN NEBU: 2.5000 mg | INHALATION_SOLUTION | Freq: Four times a day (QID) | RESPIRATORY_TRACT | 6 refills | Status: DC | PRN

## 2020-05-11 MED ORDER — UMECLIDINIUM-VILANTEROL 62.5-25 MCG/INH IN AEPB: 1.0000 | INHALATION_SPRAY | Freq: Every day | RESPIRATORY_TRACT | 3 refills | Status: DC

## 2020-05-11 MED ORDER — ALBUTEROL SULFATE (2.5 MG/3ML) 0.083% IN NEBU
2.5000 mg | INHALATION_SOLUTION | Freq: Once | RESPIRATORY_TRACT | Status: AC
  Administered 2020-05-11: 2.5 mg via RESPIRATORY_TRACT

## 2020-05-11 NOTE — Progress Notes (Signed)
BP (!) 143/96   Pulse (!) 105   Temp 98.4 F (36.9 C) (Oral)   Wt 239 lb 3.2 oz (108.5 kg)   SpO2 96%   BMI 44.46 kg/m    Subjective:    Patient ID: Mariah Gonzalez, female    DOB: 17-Nov-1990, 30 y.o.   MRN: [id number]  HPI: Mariah Gonzalez is a 30 y.o. female  Chief Complaint  Patient presents with  . Sore Throat    Patient states she feels something is in her throat. Patient states she does not know if has something stuck or swollen uvula.   UPPER RESPIRATORY TRACT INFECTION Duration: 2.5 weeks Worst symptom: cough Fever: no Cough: yes Shortness of breath: yes Wheezing: yes Chest pain: yes, with cough Chest tightness: yes Chest congestion: yes Nasal congestion: yes Runny nose: yes Post nasal drip: yes Sneezing: no Sore throat: yes Swollen glands: yes Sinus pressure: no Headache: yes Face pain: yes Toothache: no Ear pain: yes  Ear pressure: yes  Eyes red/itching:no Eye drainage/crusting: no  Vomiting: no Rash: no Fatigue: yes Sick contacts: yes Strep contacts: no  Context: better Recurrent sinusitis: no Relief with OTC cold/cough medications: no  Treatments attempted: prednisone, cold/sinus, mucinex, anti-histamine, pseudoephedrine, cough syrup and antibiotics   Relevant past medical, surgical, family and social history reviewed and updated as indicated. Interim medical history since our last visit reviewed. Allergies and medications reviewed and updated.  Review of Systems  Constitutional: Negative.   HENT: Positive for congestion, postnasal drip and rhinorrhea. Negative for dental problem, drooling, ear discharge, ear pain, facial swelling, hearing loss, mouth sores, nosebleeds, sinus pressure, sinus pain, sneezing, sore throat, tinnitus, trouble swallowing and voice change.   Respiratory: Positive for cough, shortness of breath and wheezing. Negative for apnea, choking, chest tightness and stridor.   Cardiovascular: Negative.    Gastrointestinal: Negative.   Musculoskeletal: Negative.   Psychiatric/Behavioral: Negative.     Per HPI unless specifically indicated above     Objective:    BP (!) 143/96   Pulse (!) 105   Temp 98.4 F (36.9 C) (Oral)   Wt 239 lb 3.2 oz (108.5 kg)   SpO2 96%   BMI 44.46 kg/m   Wt Readings from Last 3 Encounters:  05/11/20 239 lb 3.2 oz (108.5 kg)  03/22/20 231 lb 6.4 oz (105 kg)  01/31/20 224 lb 3.2 oz (101.7 kg)    Physical Exam Vitals and nursing note reviewed.  Constitutional:      General: She is not in acute distress.    Appearance: Normal appearance. She is not ill-appearing, toxic-appearing or diaphoretic.  HENT:     Head: Normocephalic and atraumatic.     Right Ear: External ear normal.     Left Ear: External ear normal.     Nose: Nose normal.     Mouth/Throat:     Mouth: Mucous membranes are moist.     Pharynx: Oropharynx is clear.  Eyes:     General: No scleral icterus.       Right eye: No discharge.        Left eye: No discharge.     Extraocular Movements: Extraocular movements intact.     Conjunctiva/sclera: Conjunctivae normal.     Pupils: Pupils are equal, round, and reactive to light.  Cardiovascular:     Rate and Rhythm: Normal rate and regular rhythm.     Pulses: Normal pulses.     Heart sounds: Normal heart sounds. No murmur heard. No friction  rub. No gallop.   Pulmonary:     Effort: Pulmonary effort is normal. No respiratory distress.     Breath sounds: No stridor. Wheezing and rhonchi present. No rales.  Chest:     Chest wall: No tenderness.  Musculoskeletal:        General: Normal range of motion.     Cervical back: Normal range of motion and neck supple.  Skin:    General: Skin is warm and dry.     Capillary Refill: Capillary refill takes less than 2 seconds.     Coloration: Skin is not jaundiced or pale.     Findings: No bruising, erythema, lesion or rash.  Neurological:     General: No focal deficit present.     Mental Status:  She is alert and oriented to person, place, and time. Mental status is at baseline.  Psychiatric:        Mood and Affect: Mood normal.        Behavior: Behavior normal.        Thought Content: Thought content normal.        Judgment: Judgment normal.     Results for orders placed or performed in visit on 05/11/20  Rapid Strep Screen ([facility] ONLY)   Specimen: Other   Other  Result Value Ref Range   Strep Gp A Ag, IA W/Reflex Negative Negative  Culture, Group A Strep   Other  Result Value Ref Range   Strep A Culture WILL FOLLOW       Assessment & Plan:   Problem List Items Addressed This Visit   None   Visit Diagnoses    Wheezing    -  Primary   Better after neb. Continue albuterol and prednisone. Start anoro. Recheck 2 weeks. Call with any concerns.    Relevant Medications   albuterol (PROVENTIL) (2.5 MG/3ML) 0.083% nebulizer solution 2.5 mg (Completed)   Sore throat       Uuvula slightly swollen. Advised salt water gargles. Call with any concerns.    Relevant Orders   Rapid Strep Screen ([facility] ONLY) (Completed)       Follow up plan: No follow-ups on file.

## 2020-05-14 ENCOUNTER — Ambulatory Visit: Payer: BC Managed Care – PPO | Admitting: Family Medicine

## 2020-05-15 ENCOUNTER — Encounter: Payer: Self-pay | Admitting: Licensed Clinical Social Worker

## 2020-05-15 ENCOUNTER — Ambulatory Visit (INDEPENDENT_AMBULATORY_CARE_PROVIDER_SITE_OTHER): Payer: BC Managed Care – PPO | Admitting: Licensed Clinical Social Worker

## 2020-05-15 ENCOUNTER — Other Ambulatory Visit: Payer: Self-pay

## 2020-05-15 DIAGNOSIS — F3162 Bipolar disorder, current episode mixed, moderate: Secondary | ICD-10-CM | POA: Diagnosis not present

## 2020-05-15 DIAGNOSIS — F4010 Social phobia, unspecified: Secondary | ICD-10-CM

## 2020-05-15 DIAGNOSIS — F410 Panic disorder [episodic paroxysmal anxiety] without agoraphobia: Secondary | ICD-10-CM | POA: Diagnosis not present

## 2020-05-15 DIAGNOSIS — F4310 Post-traumatic stress disorder, unspecified: Secondary | ICD-10-CM

## 2020-05-15 NOTE — Progress Notes (Signed)
Virtual Visit via Video Note  I connected with [name] on 05/15/20 at  3:00 PM EDT by a video enabled telemedicine application and verified that I am speaking with the correct person using two identifiers.  Participating Parties Patient Provider  Location: Patient: Home Provider: Home Office   Pt camera was not working, however patient confirmed she could both see and hear therapist throughout session. I discussed the limitations of evaluation and management by telemedicine and the availability of in person appointments. The patient expressed understanding and agreed to proceed.  THERAPY PROGRESS NOTE  Session Time: 24 Minutes  Participation Level: Active  Behavioral Response: AlertAnxious  Type of Therapy: Individual Therapy  Treatment Goals addressed: Anxiety and Coping  Interventions: CBT  Summary: Mariah Gonzalez is a 30 y.o. female who presents with depression and anxiety sxs. Pt reported she has noticed ability to get more things done and attributed this to taking prednisone s/ "I found that it can cause manic episodes". Pt reported primary stressors include continuing health problems for which she is taking antibiotics and work. 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 Pt acknowledged the small wins and what she does and does not have control over. Pt identified coping thoughts. Pt discussed ways she is able to cope with and address feelings of overwhelm and difficulties focusing.    Suicidal/Homicidal: No  Therapist Response: Therapist met with patient for follow up. Therapist and patient reviewed sxs, current stressors and attempts to cope. Therapist validated patient feelings/concerns. Therapist provided psychoeducation on how to utilize a focus plan. Therapist and patient discussed options for continued care.   Plan: Return again in 2 weeks.  Diagnosis: Axis I: Bipolar, mixed, Post  Traumatic Stress Disorder, Social Anxiety and Panic Attacks    Axis II: N/A  [name], LCSW, LCAS 05/15/2020

## 2020-05-24 LAB — RAPID STREP SCREEN (MED CTR MEBANE ONLY): Strep Gp A Ag, IA W/Reflex: NEGATIVE

## 2020-05-24 LAB — CULTURE, GROUP A STREP

## 2020-05-28 ENCOUNTER — Ambulatory Visit (INDEPENDENT_AMBULATORY_CARE_PROVIDER_SITE_OTHER): Payer: BC Managed Care – PPO | Admitting: Licensed Clinical Social Worker

## 2020-05-28 ENCOUNTER — Other Ambulatory Visit: Payer: Self-pay

## 2020-05-28 ENCOUNTER — Encounter: Payer: Self-pay | Admitting: Licensed Clinical Social Worker

## 2020-05-28 DIAGNOSIS — F4310 Post-traumatic stress disorder, unspecified: Secondary | ICD-10-CM | POA: Diagnosis not present

## 2020-05-28 DIAGNOSIS — F4010 Social phobia, unspecified: Secondary | ICD-10-CM | POA: Diagnosis not present

## 2020-05-28 DIAGNOSIS — F3162 Bipolar disorder, current episode mixed, moderate: Secondary | ICD-10-CM

## 2020-05-28 DIAGNOSIS — F410 Panic disorder [episodic paroxysmal anxiety] without agoraphobia: Secondary | ICD-10-CM

## 2020-05-28 NOTE — Progress Notes (Signed)
Virtual Visit via Telephone Note  I connected with Mariah Gonzalez on 05/28/20 at  3:00 PM EDT by telephone and verified that I am speaking with the correct person using two identifiers.  Participating Parties Patient Provider  Location: Patient: Home Provider: Home Office   I discussed the limitations, risks, security and privacy concerns of performing an evaluation and management service by telephone and the availability of in person appointments. I also discussed with the patient that there may be a patient responsible charge related to this service. The patient expressed understanding and agreed to proceed.  THERAPY PROGRESS NOTE  Session Time: 30 Minutes  Participation Level: Active  Behavioral Response: AlertAnxious and Tired  Type of Therapy: Individual Therapy  Treatment Goals addressed: Anxiety and Coping  Interventions: CBT  Summary: Mariah Gonzalez is a 30 y.o. female who presents with depression and anxiety sxs. Pt did not respond to invite for video session. Pt answered phone s/ that she had just woken up. Pt reported "I am clearly lacking in energy and motivation today" and has been on Spring Break since Friday. Pt identified recent stressors around health issues and well-being of her pet. Pt identified main health related sxs include headaches and not feeling as hungry. Pt reported she is working with her doctor to schedule allergy testing and to get her tonsils taken out. 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 Pt weighed pros and cons as well as used coping thoughts to manage anxiety sxs around these stressors.  Suicidal/Homicidal: No  Therapist Response: Therapist met with patient for follow up. Therapist and patient processed thoughts, feelings, reactions and ways of coping with sxs.    Plan: Return again as needed to resume therapy with new therapist/pursue  other therapy options.  Diagnosis: Axis I: Bipolar, mixed, Post Traumatic Stress Disorder, Social Anxiety and Panic Attacks    Axis II: N/A  [name], LCSW, LCAS 05/28/2020

## 2020-05-29 ENCOUNTER — Encounter: Payer: Self-pay | Admitting: Family Medicine

## 2020-05-29 ENCOUNTER — Ambulatory Visit (INDEPENDENT_AMBULATORY_CARE_PROVIDER_SITE_OTHER): Payer: BC Managed Care – PPO | Admitting: Family Medicine

## 2020-05-29 ENCOUNTER — Other Ambulatory Visit: Payer: Self-pay

## 2020-05-29 VITALS — BP 119/83 | HR 105 | Temp 98.6°F | Wt 240.2 lb

## 2020-05-29 DIAGNOSIS — K219 Gastro-esophageal reflux disease without esophagitis: Secondary | ICD-10-CM | POA: Diagnosis not present

## 2020-05-29 DIAGNOSIS — G43809 Other migraine, not intractable, without status migrainosus: Secondary | ICD-10-CM

## 2020-05-29 DIAGNOSIS — R053 Chronic cough: Secondary | ICD-10-CM | POA: Diagnosis not present

## 2020-05-29 MED ORDER — OMEPRAZOLE 40 MG PO CPDR: 40.0000 mg | DELAYED_RELEASE_CAPSULE | Freq: Every day | ORAL | 1 refills | Status: DC

## 2020-05-29 MED ORDER — KETOROLAC TROMETHAMINE 60 MG/2ML IM SOLN
60.0000 mg | Freq: Once | INTRAMUSCULAR | Status: AC
  Administered 2020-05-29: 30 mg via INTRAMUSCULAR

## 2020-05-29 MED ORDER — KETOROLAC TROMETHAMINE 30 MG/ML IJ SOLN: 30.0000 mg | Freq: Once | INTRAMUSCULAR | Status: DC

## 2020-05-29 MED ORDER — CYCLOBENZAPRINE HCL 10 MG PO TABS: 10.0000 mg | ORAL_TABLET | Freq: Every day | ORAL | 2 refills | Status: DC

## 2020-05-29 NOTE — Assessment & Plan Note (Signed)
Will start omeprazole. Recheck 1-2 months. Call with any concerns.

## 2020-05-29 NOTE — Assessment & Plan Note (Signed)
Toradol shot given today. Continue current regimen. Continue to monitor. Call with any concerns. Continue to monitor.

## 2020-05-29 NOTE — Progress Notes (Signed)
BP 119/83   Pulse (!) 105   Temp 98.6 F (37 C)   Wt 240 lb 3.2 oz (109 kg)   SpO2 98%   BMI 44.65 kg/m    Subjective:    Patient ID: Mariah Gonzalez, female    DOB: 1990/03/24, 30 y.o.   MRN: [id number]  HPI: Mariah Gonzalez is a 30 y.o. female  Chief Complaint  Patient presents with  . Cough    Follow up   . Gastroesophageal Reflux    Patient states she is having acid reflux, worse at night when she is trying to sleeping    She went to ENT and was not happy with her care there. She is going to have a tonsilectomy and allergy testing. She does not want to changes ENTs at this time. She notes that she has been coughing a little less in the AMs. She will start coughing with laughing or talking. No fevers. Feeling better. She notes that the anoro has been helping and she credits it with being th reason that she is doing better. She notes that she has been having acid reflux. She is currently not taking anything for it. She was on omeprazole in the past when she was a teenager and had laryngitis.    She notes that she had a migraine over the weekend and it's coming back today and has been getting really bad.   GERD GERD control status: exacerbated Satisfied with current treatment? no Heartburn frequency: every night when she lays down Medication side effects: no  Previous GERD medications: omeprazole as a teenager Dysphagia: no Odynophagia:  no Hematemesis: no Blood in stool: no EGD: no  Relevant past medical, surgical, family and social history reviewed and updated as indicated. Interim medical history since our last visit reviewed. Allergies and medications reviewed and updated.  Review of Systems  Constitutional: Negative.   HENT: Negative.   Respiratory: Positive for cough. Negative for apnea, choking, chest tightness, shortness of breath, wheezing and stridor.   Cardiovascular: Negative.   Gastrointestinal: Negative.   Musculoskeletal:  Negative.   Psychiatric/Behavioral: Negative.   Per HPI unless specifically indicated above     Objective:    BP 119/83   Pulse (!) 105   Temp 98.6 F (37 C)   Wt 240 lb 3.2 oz (109 kg)   SpO2 98%   BMI 44.65 kg/m   Wt Readings from Last 3 Encounters:  05/29/20 240 lb 3.2 oz (109 kg)  05/11/20 239 lb 3.2 oz (108.5 kg)  03/22/20 231 lb 6.4 oz (105 kg)    Physical Exam Vitals and nursing note reviewed.  Constitutional:      General: She is not in acute distress.    Appearance: Normal appearance. She is not ill-appearing, toxic-appearing or diaphoretic.  HENT:     Head: Normocephalic and atraumatic.     Right Ear: External ear normal.     Left Ear: External ear normal.     Nose: Nose normal.     Mouth/Throat:     Mouth: Mucous membranes are moist.     Pharynx: Oropharynx is clear.  Eyes:     General: No scleral icterus.       Right eye: No discharge.        Left eye: No discharge.     Extraocular Movements: Extraocular movements intact.     Conjunctiva/sclera: Conjunctivae normal.     Pupils: Pupils are equal, round, and reactive to light.  Cardiovascular:  Rate and Rhythm: Normal rate and regular rhythm.     Pulses: Normal pulses.     Heart sounds: Normal heart sounds. No murmur heard. No friction rub. No gallop.   Pulmonary:     Effort: Pulmonary effort is normal. No respiratory distress.     Breath sounds: Normal breath sounds. No stridor. No wheezing, rhonchi or rales.     Comments: Barking cough Chest:     Chest wall: No tenderness.  Musculoskeletal:        General: Normal range of motion.     Cervical back: Normal range of motion and neck supple.  Skin:    General: Skin is warm and dry.     Capillary Refill: Capillary refill takes less than 2 seconds.     Coloration: Skin is not jaundiced or pale.     Findings: No bruising, erythema, lesion or rash.  Neurological:     General: No focal deficit present.     Mental Status: She is alert and oriented to  person, place, and time. Mental status is at baseline.  Psychiatric:        Mood and Affect: Mood normal.        Behavior: Behavior normal.        Thought Content: Thought content normal.        Judgment: Judgment normal.     Results for orders placed or performed in visit on 05/11/20  Rapid Strep Screen ([facility] ONLY)   Specimen: Other   Other  Result Value Ref Range   Strep Gp A Ag, IA W/Reflex Negative Negative  Culture, Group A Strep   Other  Result Value Ref Range   Strep A Culture CANCELED       Assessment & Plan:   Problem List Items Addressed This Visit      Cardiovascular and Mediastinum   Migraine    Toradol shot given today. Continue current regimen. Continue to monitor. Call with any concerns. Continue to monitor.       Relevant Medications   cyclobenzaprine (FLEXERIL) 10 MG tablet     Digestive   GERD (gastroesophageal reflux disease)    Will start omeprazole. Recheck 1-2 months. Call with any concerns.       Relevant Medications   omeprazole (PRILOSEC) 40 MG capsule     Other   Chronic cough - Primary    Tolerating her anoro well and feeling well on it. Lungs clear today- concern for GERD and upper airway issues. To have tonsillectomy and started on omeprazole. If still not getting better, consider referral to Dr. Sherene Sires.           Follow up plan: Return in about 2 months (around 07/29/2020).

## 2020-05-29 NOTE — Assessment & Plan Note (Signed)
Tolerating her anoro well and feeling well on it. Lungs clear today- concern for GERD and upper airway issues. To have tonsillectomy and started on omeprazole. If still not getting better, consider referral to Dr. Sherene Sires.

## 2020-06-05 ENCOUNTER — Encounter: Payer: Self-pay | Admitting: Family Medicine

## 2020-06-08 ENCOUNTER — Other Ambulatory Visit: Payer: Self-pay | Admitting: Family Medicine

## 2020-06-08 DIAGNOSIS — R053 Chronic cough: Secondary | ICD-10-CM

## 2020-06-22 ENCOUNTER — Other Ambulatory Visit: Payer: Self-pay

## 2020-06-22 ENCOUNTER — Ambulatory Visit: Payer: BC Managed Care – PPO | Admitting: Family Medicine

## 2020-06-22 ENCOUNTER — Encounter: Payer: Self-pay | Admitting: Family Medicine

## 2020-06-22 VITALS — BP 135/88 | HR 98 | Temp 98.8°F | Wt 241.4 lb

## 2020-06-22 DIAGNOSIS — N6325 Unspecified lump in the left breast, overlapping quadrants: Secondary | ICD-10-CM

## 2020-06-22 NOTE — Patient Instructions (Signed)
Call to schedule your [place]: [facility]  Address: [address] [place], [place], [place] [address]  Phone: [phone]

## 2020-06-22 NOTE — Progress Notes (Signed)
BP 135/88   Pulse 98   Temp 98.8 F (37.1 C)   Wt 241 lb 6.4 oz (109.5 kg)   SpO2 96%   BMI 44.87 kg/m    Subjective:    Patient ID: Mariah Gonzalez, female    DOB: [birth year], 30 y.o.   MRN: [id number]  HPI: Mariah Gonzalez is a 30 y.o. female  Chief Complaint  Patient presents with  . Breast Mass    Patient states she found a lump on her left breast. Patient states lump is a little painful.   BREAST PAIN Duration :at least a day, unsure how long Location: L 3:30 Onset: unsure Severity: a lot when touching it Quality: dull aching Frequency: touching  Redness: no Swelling: no Trauma: no trauma Breastfeeding: no Associated with menstral cycle: unsure Nipple discharge: no Breast lump: yes Status: worse Treatments attempted: none Previous mammogram: no  Relevant past medical, surgical, family and social history reviewed and updated as indicated. Interim medical history since our last visit reviewed. Allergies and medications reviewed and updated.  Review of Systems  Constitutional: Negative.   Respiratory: Negative.   Cardiovascular: Negative.   Gastrointestinal: Negative.   Musculoskeletal: Negative.   Psychiatric/Behavioral: Negative.     Per HPI unless specifically indicated above     Objective:    BP 135/88   Pulse 98   Temp 98.8 F (37.1 C)   Wt 241 lb 6.4 oz (109.5 kg)   SpO2 96%   BMI 44.87 kg/m   Wt Readings from Last 3 Encounters:  06/22/20 241 lb 6.4 oz (109.5 kg)  05/29/20 240 lb 3.2 oz (109 kg)  05/11/20 239 lb 3.2 oz (108.5 kg)    Physical Exam Vitals and nursing note reviewed.  Constitutional:      General: She is not in acute distress.    Appearance: Normal appearance. She is not ill-appearing, toxic-appearing or diaphoretic.  HENT:     Head: Normocephalic and atraumatic.     Right Ear: External ear normal.     Left Ear: External ear normal.     Nose: Nose normal.     Mouth/Throat:     Mouth: Mucous  membranes are moist.     Pharynx: Oropharynx is clear.  Eyes:     General: No scleral icterus.       Right eye: No discharge.        Left eye: No discharge.     Extraocular Movements: Extraocular movements intact.     Conjunctiva/sclera: Conjunctivae normal.     Pupils: Pupils are equal, round, and reactive to light.  Cardiovascular:     Rate and Rhythm: Normal rate and regular rhythm.     Pulses: Normal pulses.     Heart sounds: Normal heart sounds. No murmur heard. No friction rub. No gallop.   Pulmonary:     Effort: Pulmonary effort is normal. No respiratory distress.     Breath sounds: Normal breath sounds. No stridor. No wheezing, rhonchi or rales.  Chest:     Chest wall: No tenderness.    Musculoskeletal:        General: Normal range of motion.     Cervical back: Normal range of motion and neck supple.  Skin:    General: Skin is warm and dry.     Capillary Refill: Capillary refill takes less than 2 seconds.     Coloration: Skin is not jaundiced or pale.     Findings: No bruising, erythema, lesion or rash.  Neurological:     General: No focal deficit present.     Mental Status: She is alert and oriented to person, place, and time. Mental status is at baseline.  Psychiatric:        Mood and Affect: Mood normal.        Behavior: Behavior normal.        Thought Content: Thought content normal.        Judgment: Judgment normal.     Results for orders placed or performed in visit on 05/11/20  Rapid Strep Screen ([facility] ONLY)   Specimen: Other   Other  Result Value Ref Range   Strep Gp A Ag, IA W/Reflex Negative Negative  Culture, Group A Strep   Other  Result Value Ref Range   Strep A Culture CANCELED       Assessment & Plan:   Problem List Items Addressed This Visit   None   Visit Diagnoses    Breast lump on left side at 3 o'clock position    -  Primary   Will get set up for [place] and mammogram. Warm compresses. Avoid caffiene. Call if not getting  better or getting worse.    Relevant Orders   US BREAST LTD UNI LEFT INC AXILLA   MM DIAG BREAST TOMO BILATERAL       Follow up plan: Return if symptoms worsen or fail to improve.

## 2020-06-25 ENCOUNTER — Other Ambulatory Visit: Payer: Self-pay

## 2020-06-25 ENCOUNTER — Ambulatory Visit: Payer: BC Managed Care – PPO | Admitting: Family Medicine

## 2020-06-25 ENCOUNTER — Encounter: Payer: Self-pay | Admitting: Otolaryngology

## 2020-07-01 NOTE — Anesthesia Preprocedure Evaluation (Addendum)
Anesthesia Evaluation  Patient identified by MRN, date of birth, ID band Patient awake    Reviewed: Allergy & Precautions, NPO status , Patient's Chart, lab work & pertinent test results  History of Anesthesia Complications Negative for: history of anesthetic complications  Airway Mallampati: I   Neck ROM: Full    Dental no notable dental hx.    Pulmonary asthma , Current Smoker (smokes less than 1 ppd) and Patient abstained from smoking.,  Chronic cough   Pulmonary exam normal breath sounds clear to auscultation       Cardiovascular Exercise Tolerance: Good Normal cardiovascular exam Rhythm:Regular Rate:Normal  ECG 03/22/20: ST (HR 114), otherwise normal   Neuro/Psych  Headaches, PSYCHIATRIC DISORDERS (ADHD) Anxiety Depression    GI/Hepatic GERD  ,  Endo/Other  Class 3 obesity  Renal/GU negative Renal ROS     Musculoskeletal   Abdominal   Peds  Hematology negative hematology ROS (+)   Anesthesia Other Findings   Reproductive/Obstetrics                            Anesthesia Physical Anesthesia Plan  ASA: III  Anesthesia Plan: General   Post-op Pain Management:    Induction: Intravenous  PONV Risk Score and Plan: 2 and Ondansetron, Dexamethasone and Treatment may vary due to age or medical condition  Airway Management Planned: Oral ETT  Additional Equipment:   Intra-op Plan:   Post-operative Plan: Extubation in OR  Informed Consent: I have reviewed the patients History and Physical, chart, labs and discussed the procedure including the risks, benefits and alternatives for the proposed anesthesia with the patient or authorized representative who has indicated his/her understanding and acceptance.       Plan Discussed with: CRNA  Anesthesia Plan Comments:        Anesthesia Quick Evaluation

## 2020-07-02 NOTE — Discharge Instructions (Signed)
T & A INSTRUCTION SHEET - [facility]  P. SCOTT BENNETT, MD  [address] [place],  [address] [place]. [phone] [address] [place] [address] [phone]  INFORMATION SHEET FOR A TONSILLECTOMY AND ADENDOIDECTOMY  About Your Tonsils and Adenoids The tonsils and adenoids are normal body tissues that are part of our immune system. They normally help to protect us against diseases that may enter our mouth and nose. However, sometimes the tonsils and/or adenoids become too large and obstruct our breathing, especially at night.  If either of these things happen it helps to remove the tonsils and adenoids in order to become healthier. The operation to remove the tonsils and adenoids is called a tonsillectomy and adenoidectomy.  The Location of Your Tonsils and Adenoids The tonsils are located in the back of the throat on both side and sit in a cradle of muscles. The adenoids are located in the roof of the mouth, behind the nose, and closely associated with the opening of the Eustachian tube to the ear.  Surgery on Tonsils and Adenoids A tonsillectomy and adenoidectomy is a short operation which takes about thirty minutes. This includes being put to sleep and being awakened. Tonsillectomies and adenoidectomies are performed at [facility] and may require observation period in the recovery room prior to going home. Children are required to remain in the recovery area for 45 minutes after surgery.  Following the Operation for a Tonsillectomy A cautery machine is used to control bleeding.  Bleeding from a tonsillectomy and adenoidectomy is minimal and postoperatively the risk of bleeding is approximately four percent, although this rarely life threatening.  After your tonsillectomy and adenoidectomy post-op care at home: 1. Our patients are able to go home the same day. You may be given prescriptions for  pain medications and antibiotics, if indicated. 2. It is extremely important to remember that fluid intake is of utmost importance after a tonsillectomy. The amount that you drink must be maintained in the postoperative period. A good indication of whether a child is getting enough fluid is whether his/her urine output is constant.  As long as children are urinating or wetting their diaper every 6 - 8 hours this is usually enough fluid intake.   3. Although rare, this is a risk of some bleeding in the first ten days after surgery. This usually occurs between day five and nine postoperatively. This risk of bleeding is approximately four percent.  If you or your child should have any bleeding you should remain calm and notify our office or go directly to the Emergency Room at [facility] where they will contact us. Our doctors are available seven days a week for notification. We recommend sitting up quietly in a chair, place an ice pack on the front of the neck and spitting out the blood gently until we are able to contact you. Adults should gargle gently with ice water and this may help stop the bleeding. If the bleeding does not stop after a short time, i.e. 10 to 15 minutes, or seems to be increasing again, please contact us or go to the hospital.   4. It is common for the pain to be worse at 5 - 7 days postoperatively. This occurs because the "scab" is peeling off and the mucous membrane (skin of the throat) is growing back where the tonsils were.   5. It is common for a low-grade fever,   less than 102, during the first week after a tonsillectomy and adenoidectomy. It is usually due to not drinking enough liquids, and we suggest your use liquid Tylenol (acetaminophen) or the pain medicine with Tylenol (acetaminophen) prescribed in order to keep your temperature below 102. Please follow the directions on the back of the bottle. 6. Do not take aspirin or any products that contain aspirin  such as Bufferin, Anacin, Ecotrin, aspirin gum, Goodies, BC headache powders, etc., after a T&A because it can promote bleeding.  DO NOT TAKE MOTRIN OR IBUPROFEN. Please check with our office before administering any other medication that may been prescribed by other doctors during the two-week post-operative period. 7. If you happen to look in the mirror or into your child's mouth you will see white/gray patches on the back of the throat.  This is what a scab looks like in the mouth and is normal after having a tonsillectomy and adenoidectomy. It will disappear once the tonsil area heals completely. However, it may cause a noticeable odor, and this too will disappear with time.     8. You or your child may experience ear pain after having a tonsillectomy and adenoidectomy. This is called referred pain and comes from the throat, but it is felt in the ears. Ear pain is quite common and expected. It will usually go away after ten days. There is usually nothing wrong with the ears, and it is primarily due to the healing area stimulating the nerve to the ear that runs along the side of the throat. Use either the prescribed pain medicine or Tylenol (acetaminophen) as needed.  9. The throat tissues after a tonsillectomy are obviously sensitive. Smoking around children who have had a tonsillectomy significantly increases the risk of bleeding.  DO NOT SMOKE! What to Expect Each Day  First Day at Home 1. Patients will be discharged home the same day.  2. Drink at least four glasses of liquid a day. Clear, cool liquids are recommended. Fruit juices containing citric acid are not recommended because they tend to cause pain. Carbonated beverages are allowed if you pour them from glass to glass to remove the bubbles as these tend to cause discomfort. Avoid alcoholic beverages.  3. Eat very soft foods such as soups, broth, jello, custard, pudding, ice cream, popsicles, applesauce, mashed potatoes, and in general anything  that you can crush between your tongue and the roof of your mouth. Try adding Carnation Instant Breakfast Mix into your food for extra calories. It is not uncommon to lose 5 to 10 pounds of fluid weight. The weight will be gained back quickly once you're feeling better and drinking more.  4. Sleep with your head elevated on two pillows for about three days to help decrease the swelling.  5. DO NOT SMOKE!  Day Two  1. Rest as much as possible. Use common sense in your activities.  2. Continue drinking at least four glasses of liquid per day.  3. Follow the soft diet.  4. Use your pain medication as needed.  Day Three  1. Advance your activity as you are able and continue to follow the previous day's suggestions.  Days Four Through Six  1. Advance your diet and begin to eat more solid foods such as chopped hamburger. 2. Advance your activities slowly. Children should be kept mostly around the house.  3. Not uncommonly, there will be more pain at this time. It is temporary, usually lasting a day or two.  Day Seven   Through Ten  1. Most individuals by this time are able to return to work or school unless otherwise instructed. Consider sending children back to school for a half day on the first day back.  General Anesthesia, Adult, Care After This sheet gives you information about how to care for yourself after your procedure. Your health care provider may also give you more specific instructions. If you have problems or questions, contact your health care provider. What can I expect after the procedure? After the procedure, the following side effects are common:  Pain or discomfort at the IV site.  Nausea.  Vomiting.  Sore throat.  Trouble concentrating.  Feeling cold or chills.  Feeling weak or tired.  Sleepiness and fatigue.  Soreness and body aches. These side effects can affect parts of the body that were not involved in surgery. Follow these instructions at home: For the time  period you were told by your health care provider:  Rest.  Do not participate in activities where you could fall or become injured.  Do not drive or use machinery.  Do not drink alcohol.  Do not take sleeping pills or medicines that cause drowsiness.  Do not make important decisions or sign legal documents.  Do not take care of children on your own.   Eating and drinking  Follow any instructions from your health care provider about eating or drinking restrictions.  When you feel hungry, start by eating small amounts of foods that are soft and easy to digest (bland), such as toast. Gradually return to your regular diet.  Drink enough fluid to keep your urine pale yellow.  If you vomit, rehydrate by drinking water, juice, or clear broth. General instructions  If you have sleep apnea, surgery and certain medicines can increase your risk for breathing problems. Follow instructions from your health care provider about wearing your sleep device: ? Anytime you are sleeping, including during daytime naps. ? While taking prescription pain medicines, sleeping medicines, or medicines that make you drowsy.  Have a responsible adult stay with you for the time you are told. It is important to have someone help care for you until you are awake and alert.  Return to your normal activities as told by your health care provider. Ask your health care provider what activities are safe for you.  Take over-the-counter and prescription medicines only as told by your health care provider.  If you smoke, do not smoke without supervision.  Keep all follow-up visits as told by your health care provider. This is important. Contact a health care provider if:  You have nausea or vomiting that does not get better with medicine.  You cannot eat or drink without vomiting.  You have pain that does not get better with medicine.  You are unable to pass urine.  You develop a skin rash.  You have a  fever.  You have redness around your IV site that gets worse. Get help right away if:  You have difficulty breathing.  You have chest pain.  You have blood in your urine or stool, or you vomit blood. Summary  After the procedure, it is common to have a sore throat or nausea. It is also common to feel tired.  Have a responsible adult stay with you for the time you are told. It is important to have someone help care for you until you are awake and alert.  When you feel hungry, start by eating small amounts of foods that are soft   and easy to digest (bland), such as toast. Gradually return to your regular diet.  Drink enough fluid to keep your urine pale yellow.  Return to your normal activities as told by your health care provider. Ask your health care provider what activities are safe for you. This information is not intended to replace advice given to you by your health care provider. Make sure you discuss any questions you have with your health care provider. Document Revised: 10/13/2019 Document Reviewed: 05/12/2019 Elsevier Patient Education  2021 [employer]  

## 2020-07-03 ENCOUNTER — Other Ambulatory Visit: Payer: Self-pay

## 2020-07-03 ENCOUNTER — Encounter
Admission: RE | Disposition: A | Discharge status: Home / Self Care | Payer: Self-pay | Source: Home / Self Care | Attending: Otolaryngology

## 2020-07-03 ENCOUNTER — Encounter: Payer: Self-pay | Admitting: Otolaryngology

## 2020-07-03 ENCOUNTER — Ambulatory Visit: Payer: BC Managed Care – PPO | Admitting: Anesthesiology

## 2020-07-03 ENCOUNTER — Ambulatory Visit
Admission: RE | Admit: 2020-07-03 | Discharge: 2020-07-03 | Disposition: A | Discharge status: Home / Self Care | Payer: BC Managed Care – PPO | Attending: Otolaryngology | Admitting: Otolaryngology

## 2020-07-03 DIAGNOSIS — J3501 Chronic tonsillitis: Secondary | ICD-10-CM | POA: Diagnosis present

## 2020-07-03 DIAGNOSIS — F17210 Nicotine dependence, cigarettes, uncomplicated: Secondary | ICD-10-CM | POA: Diagnosis not present

## 2020-07-03 DIAGNOSIS — Z79899 Other long term (current) drug therapy: Secondary | ICD-10-CM | POA: Diagnosis not present

## 2020-07-03 DIAGNOSIS — J351 Hypertrophy of tonsils: Secondary | ICD-10-CM | POA: Diagnosis not present

## 2020-07-03 DIAGNOSIS — J301 Allergic rhinitis due to pollen: Secondary | ICD-10-CM | POA: Diagnosis not present

## 2020-07-03 HISTORY — PX: TONSILLECTOMY: SHX5217

## 2020-07-03 LAB — POCT PREGNANCY, URINE: Preg Test, Ur: NEGATIVE

## 2020-07-03 SURGERY — TONSILLECTOMY
Anesthesia: General | Site: Throat | Laterality: Bilateral

## 2020-07-03 MED ORDER — ONDANSETRON HCL 4 MG/2ML IJ SOLN
4.0000 mg | Freq: Once | INTRAMUSCULAR | Status: AC | PRN
  Administered 2020-07-03: 4 mg via INTRAVENOUS

## 2020-07-03 MED ORDER — SUCCINYLCHOLINE CHLORIDE 20 MG/ML IJ SOLN
INTRAMUSCULAR | Status: DC | PRN
  Administered 2020-07-03: 100 mg via INTRAVENOUS

## 2020-07-03 MED ORDER — FENTANYL CITRATE (PF) 100 MCG/2ML IJ SOLN: 25.0000 ug | INTRAMUSCULAR | Status: DC | PRN

## 2020-07-03 MED ORDER — PREDNISOLONE SODIUM PHOSPHATE 15 MG/5ML PO SOLN: ORAL | 0 refills | Status: DC

## 2020-07-03 MED ORDER — LIDOCAINE HCL (CARDIAC) PF 100 MG/5ML IV SOSY
PREFILLED_SYRINGE | INTRAVENOUS | Status: DC | PRN
  Administered 2020-07-03: 40 mg via INTRAVENOUS

## 2020-07-03 MED ORDER — DEXAMETHASONE SODIUM PHOSPHATE 4 MG/ML IJ SOLN
INTRAMUSCULAR | Status: DC | PRN
  Administered 2020-07-03: 10 mg via INTRAVENOUS

## 2020-07-03 MED ORDER — BUPIVACAINE HCL 0.25 % IJ SOLN
INTRAMUSCULAR | Status: DC | PRN
  Administered 2020-07-03: 4 mL

## 2020-07-03 MED ORDER — HYDROCODONE-ACETAMINOPHEN 7.5-325 MG/15ML PO SOLN: ORAL | 0 refills | Status: DC

## 2020-07-03 MED ORDER — PROPOFOL 10 MG/ML IV BOLUS
INTRAVENOUS | Status: DC | PRN
  Administered 2020-07-03: 50 mg via INTRAVENOUS
  Administered 2020-07-03: 150 mg via INTRAVENOUS

## 2020-07-03 MED ORDER — MIDAZOLAM HCL 5 MG/5ML IJ SOLN
INTRAMUSCULAR | Status: DC | PRN
  Administered 2020-07-03: 2 mg via INTRAVENOUS

## 2020-07-03 MED ORDER — LACTATED RINGERS IV SOLN: INTRAVENOUS | Status: DC

## 2020-07-03 MED ORDER — GLYCOPYRROLATE 0.2 MG/ML IJ SOLN
INTRAMUSCULAR | Status: DC | PRN
  Administered 2020-07-03: .1 mg via INTRAVENOUS

## 2020-07-03 MED ORDER — OXYCODONE HCL 5 MG/5ML PO SOLN
5.0000 mg | Freq: Once | ORAL | Status: AC | PRN
  Administered 2020-07-03: 5 mg via ORAL

## 2020-07-03 MED ORDER — ALBUTEROL SULFATE HFA 108 (90 BASE) MCG/ACT IN AERS
INHALATION_SPRAY | RESPIRATORY_TRACT | Status: DC | PRN
  Administered 2020-07-03 (×2): 4 via RESPIRATORY_TRACT

## 2020-07-03 MED ORDER — OXYMETAZOLINE HCL 0.05 % NA SOLN
NASAL | Status: DC | PRN
  Administered 2020-07-03: 1 via TOPICAL

## 2020-07-03 MED ORDER — OXYCODONE HCL 5 MG PO TABS: 5.0000 mg | ORAL_TABLET | Freq: Once | ORAL | Status: AC | PRN

## 2020-07-03 MED ORDER — FENTANYL CITRATE (PF) 100 MCG/2ML IJ SOLN
INTRAMUSCULAR | Status: DC | PRN
  Administered 2020-07-03 (×2): 50 ug via INTRAVENOUS

## 2020-07-03 MED ORDER — ACETAMINOPHEN 10 MG/ML IV SOLN
1000.0000 mg | Freq: Once | INTRAVENOUS | Status: AC
  Administered 2020-07-03: 1000 mg via INTRAVENOUS

## 2020-07-03 MED ORDER — ACETAMINOPHEN 10 MG/ML IV SOLN: 1000.0000 mg | Freq: Once | INTRAVENOUS | Status: DC | PRN

## 2020-07-03 SURGICAL SUPPLY — 16 items
BLADE BOVIE TIP EXT 4 (BLADE) ×2 IMPLANT
CANISTER SUCT 1200ML W/VALVE (MISCELLANEOUS) ×2 IMPLANT
CATH ROBINSON RED A/P 10FR (CATHETERS) ×2 IMPLANT
COAG SUCT 10F 3.5MM HAND CTRL (MISCELLANEOUS) ×2 IMPLANT
DECANTER SPIKE VIAL GLASS SM (MISCELLANEOUS) ×2 IMPLANT
ELECT REM PT RETURN 9FT ADLT (ELECTROSURGICAL) ×2
ELECTRODE REM PT RTRN 9FT ADLT (ELECTROSURGICAL) ×1 IMPLANT
GLOVE SURG ENC MOIS LTX SZ7.5 (GLOVE) ×2 IMPLANT
KIT TURNOVER KIT A (KITS) ×2 IMPLANT
NS IRRIG 500ML POUR BTL (IV SOLUTION) ×2 IMPLANT
PACK TONSIL AND ADENOID CUSTOM (PACKS) ×2 IMPLANT
PENCIL SMOKE EVACUATOR (MISCELLANEOUS) ×2 IMPLANT
SLEEVE SUCTION 125 (MISCELLANEOUS) ×2 IMPLANT
SOL ANTI-FOG 6CC FOG-OUT (MISCELLANEOUS) ×1 IMPLANT
SOL FOG-OUT ANTI-FOG 6CC (MISCELLANEOUS) ×1
SPONGE TONSIL .75 RFD DBL STRL (DISPOSABLE) ×2 IMPLANT

## 2020-07-03 NOTE — H&P (Signed)
History and physical reviewed and will be scanned in later. No change in medical status reported by the patient or family, appears stable for surgery. All questions regarding the procedure answered, and patient (or family if a child) expressed understanding of the procedure.Discussed her left ear piercing which cannot be removed. We can try to remove it including cutting it to remove, but if we cannot she elects to proceed and accept the small risk of burn to the area.   Sandi Mealy @TODAY @

## 2020-07-03 NOTE — Anesthesia Procedure Notes (Signed)
Procedure Name: Intubation Date/Time: 07/03/2020 7:55 AM Performed by: Jimmy Picket, CRNA Pre-anesthesia Checklist: Patient identified, Emergency Drugs available, Suction available, Patient being monitored and Timeout performed Patient Re-evaluated:Patient Re-evaluated prior to induction Oxygen Delivery Method: Circle system utilized Preoxygenation: Pre-oxygenation with 100% oxygen Induction Type: IV induction Ventilation: Mask ventilation without difficulty Laryngoscope Size: Miller and 2 Grade View: Grade I Tube type: Oral Rae Tube size: 7.0 mm Number of attempts: 1 Placement Confirmation: ETT inserted through vocal cords under direct vision,  positive ETCO2 and breath sounds checked- equal and bilateral Tube secured with: Tape Dental Injury: Teeth and Oropharynx as per pre-operative assessment

## 2020-07-03 NOTE — Transfer of Care (Signed)
Immediate Anesthesia Transfer of Care Note  Patient: Mariah Gonzalez  Procedure(s) Performed: TONSILLECTOMY (Bilateral Throat)  Patient Location: PACU  Anesthesia Type: General  Level of Consciousness: awake, alert  and patient cooperative  Airway and Oxygen Therapy: Patient Spontanous Breathing and Patient connected to supplemental oxygen  Post-op Assessment: Post-op Vital signs reviewed, Patient's Cardiovascular Status Stable, Respiratory Function Stable, Patent Airway and No signs of Nausea or vomiting  Post-op Vital Signs: Reviewed and stable  Complications: No complications documented.

## 2020-07-03 NOTE — Op Note (Signed)
07/03/2020  8:34 AM    Mariah Gonzalez  [id number]   Pre-Op Diagnosis:  Chronic Tonsillitis  Post-op Diagnosis: Chronic Tonsillitis  Procedure: Tonsillectomy  Surgeon:  Sandi Mealy., MD  Anesthesia:  General endotracheal  EBL:  Less than 25 cc  Complications:  None  Findings: 2+ cryptic tonsils  Procedure: The patient was taken to the Operating Room and placed in the supine position.  After induction of general endotracheal anesthesia, the table was turned 90 degrees and the patient was draped in the usual fashion  with the eyes protected.  A mouth gag was inserted into the oral cavity to open the mouth, and examination of the oropharynx showed the uvula was non-bifid. The palate was palpated, and there was no evidence of submucous cleft. Examination of the nasopharynx showed no obstructing adenoids. The right tonsil was grasped with an Allis clamp and resected from the tonsillar fossa in the usual fashion with the Bovie. The left tonsil was resected in the same fashion. The Bovie was used to obtain hemostasis. Each tonsillar fossa was then carefully injected with 0.25% marcaine , avoiding intravascular injection. The nose and throat were irrigated and suctioned to remove any  blood clot. The mouth gag was  removed with no evidence of active bleeding.  The patient was then returned to the anesthesiologist for awakening, and was taken to the Recovery Room in stable condition.  Cultures:  None.  Specimens:  Tonsils.  Disposition:   PACU to home  Plan: Soft, bland diet and push fluids. Take pain medications and steroids as prescribed. No strenuous activity for 2 weeks. Follow-up in 3 weeks.  Sandi Mealy 07/03/2020 8:34 AM

## 2020-07-03 NOTE — Anesthesia Postprocedure Evaluation (Signed)
Anesthesia Post Note  Patient: Mariah Gonzalez  Procedure(s) Performed: TONSILLECTOMY (Bilateral Throat)     Patient location during evaluation: PACU Anesthesia Type: General Level of consciousness: awake and alert, oriented and patient cooperative Pain management: pain level controlled Vital Signs Assessment: post-procedure vital signs reviewed and stable Respiratory status: spontaneous breathing, nonlabored ventilation and respiratory function stable Cardiovascular status: blood pressure returned to baseline and stable Postop Assessment: adequate PO intake Anesthetic complications: no   No complications documented.  Reed Breech

## 2020-07-04 ENCOUNTER — Encounter: Payer: Self-pay | Admitting: Otolaryngology

## 2020-07-05 LAB — SURGICAL PATHOLOGY

## 2020-07-06 ENCOUNTER — Other Ambulatory Visit: Payer: Self-pay | Admitting: Psychiatry

## 2020-07-06 DIAGNOSIS — F4310 Post-traumatic stress disorder, unspecified: Secondary | ICD-10-CM

## 2020-07-09 ENCOUNTER — Emergency Department
Admission: EM | Admit: 2020-07-09 | Discharge: 2020-07-09 | Disposition: A | Discharge status: Home / Self Care | Payer: BC Managed Care – PPO | Attending: Emergency Medicine | Admitting: Emergency Medicine

## 2020-07-09 ENCOUNTER — Other Ambulatory Visit: Payer: Self-pay

## 2020-07-09 ENCOUNTER — Encounter: Payer: Self-pay | Admitting: Intensive Care

## 2020-07-09 DIAGNOSIS — Z7952 Long term (current) use of systemic steroids: Secondary | ICD-10-CM | POA: Diagnosis not present

## 2020-07-09 DIAGNOSIS — R000 Tachycardia, unspecified: Secondary | ICD-10-CM | POA: Diagnosis not present

## 2020-07-09 DIAGNOSIS — F17210 Nicotine dependence, cigarettes, uncomplicated: Secondary | ICD-10-CM | POA: Diagnosis not present

## 2020-07-09 DIAGNOSIS — J95830 Postprocedural hemorrhage and hematoma of a respiratory system organ or structure following a respiratory system procedure: Secondary | ICD-10-CM | POA: Diagnosis present

## 2020-07-09 DIAGNOSIS — D72829 Elevated white blood cell count, unspecified: Secondary | ICD-10-CM | POA: Diagnosis not present

## 2020-07-09 DIAGNOSIS — J45909 Unspecified asthma, uncomplicated: Secondary | ICD-10-CM | POA: Diagnosis not present

## 2020-07-09 LAB — CBC WITH DIFFERENTIAL/PLATELET
Abs Immature Granulocytes: 0.16 10*3/uL — ABNORMAL HIGH (ref 0.00–0.07)
Basophils Absolute: 0 10*3/uL (ref 0.0–0.1)
Basophils Relative: 0 %
Eosinophils Absolute: 0 10*3/uL (ref 0.0–0.5)
Eosinophils Relative: 0 %
HCT: 36.8 % (ref 36.0–46.0)
Hemoglobin: 11.9 g/dL — ABNORMAL LOW (ref 12.0–15.0)
Immature Granulocytes: 1 %
Lymphocytes Relative: 30 %
Lymphs Abs: 4.2 10*3/uL — ABNORMAL HIGH (ref 0.7–4.0)
MCH: 27.9 pg (ref 26.0–34.0)
MCHC: 32.3 g/dL (ref 30.0–36.0)
MCV: 86.4 fL (ref 80.0–100.0)
Monocytes Absolute: 0.8 10*3/uL (ref 0.1–1.0)
Monocytes Relative: 6 %
Neutro Abs: 8.6 10*3/uL — ABNORMAL HIGH (ref 1.7–7.7)
Neutrophils Relative %: 63 %
Platelets: 332 10*3/uL (ref 150–400)
RBC: 4.26 MIL/uL (ref 3.87–5.11)
RDW: 13.5 % (ref 11.5–15.5)
WBC: 14 10*3/uL — ABNORMAL HIGH (ref 4.0–10.5)
nRBC: 0 % (ref 0.0–0.2)

## 2020-07-09 LAB — BASIC METABOLIC PANEL
Anion gap: 8 (ref 5–15)
BUN: 17 mg/dL (ref 6–20)
CO2: 25 mmol/L (ref 22–32)
Calcium: 8.7 mg/dL — ABNORMAL LOW (ref 8.9–10.3)
Chloride: 104 mmol/L (ref 98–111)
Creatinine, Ser: 0.88 mg/dL (ref 0.44–1.00)
GFR, Estimated: >60 mL/min (ref >60)
Glucose, Bld: 108 mg/dL — ABNORMAL HIGH (ref 70–99)
Potassium: 3.6 mmol/L (ref 3.5–5.1)
Sodium: 137 mmol/L (ref 135–145)

## 2020-07-09 LAB — PROTIME-INR
INR: 1 (ref 0.8–1.2)
Prothrombin Time: 12.7 seconds (ref 11.4–15.2)

## 2020-07-09 LAB — TYPE AND SCREEN
ABO/RH(D): O POS
Antibody Screen: NEGATIVE

## 2020-07-09 MED ORDER — SILVER NITRATE-POT NITRATE 75-25 % EX MISC
3.0000 | Freq: Once | CUTANEOUS | Status: AC
  Administered 2020-07-09: 3 via TOPICAL
  Filled 2020-07-09: qty 30

## 2020-07-09 MED ORDER — TRANEXAMIC ACID FOR INHALATION
500.0000 mg | Freq: Once | RESPIRATORY_TRACT | Status: AC
  Administered 2020-07-09: 500 mg via RESPIRATORY_TRACT
  Filled 2020-07-09: qty 10

## 2020-07-09 MED ORDER — ONDANSETRON HCL 4 MG/2ML IJ SOLN: 4.0000 mg | Freq: Once | INTRAMUSCULAR | Status: AC

## 2020-07-09 MED ORDER — ONDANSETRON HCL 4 MG/2ML IJ SOLN
INTRAMUSCULAR | Status: AC
  Administered 2020-07-09: 4 mg via INTRAVENOUS
  Filled 2020-07-09: qty 2

## 2020-07-09 MED ORDER — LORAZEPAM 2 MG/ML IJ SOLN
1.0000 mg | Freq: Once | INTRAMUSCULAR | Status: AC
  Administered 2020-07-09: 1 mg via INTRAVENOUS
  Filled 2020-07-09: qty 1

## 2020-07-09 MED ORDER — HYDROCODONE-ACETAMINOPHEN 7.5-325 MG/15ML PO SOLN: 20.0000 mL | Freq: Four times a day (QID) | ORAL | 0 refills | Status: AC | PRN

## 2020-07-09 MED ORDER — LIDOCAINE-EPINEPHRINE 2 %-1:100000 IJ SOLN: 20.0000 mL | Freq: Once | INTRAMUSCULAR | Status: DC

## 2020-07-09 NOTE — Consult Note (Signed)
Patient is a 30 year old woman who is postop day 6 from tonsillectomy for chronic tonsillitis.  Earlier today, she noted bright red blood.  This stopped intermittently but then started up again.  She tried gargling with ice water at home but this did not work.  She presented to the emergency room where she was given TXA nebulizer treatments.  She was not coagulopathic.  Platelets normal.  No significant bleeding history.  Oropharynx was examined.  This revealed blood clot over the left tonsillar fossa with no active bleeding.  The clot was suctioned off with a Yankauer to reveal 2 possible sites of bleeding, 1 at the superior pole and 1 at the mid tonsillar fossa.  There was no active bleeding.  The right tonsillar fossa was unremarkable and healing appropriately.  The 2 sites were then cauterized with silver nitrate.  The patient tolerated this quite well.  I discussed treatment options with the patient.  I did recommend that she is observed for at least a couple of hours to ensure that bleeding does not recur.  In addition, I recommended that she stay n.p.o. until tomorrow in case she bleeds overnight.  If bleeding does recur overnight, we would need to take her to the operating room for control of hemorrhage.

## 2020-07-09 NOTE — ED Notes (Signed)
EDP into room 

## 2020-07-09 NOTE — ED Triage Notes (Signed)
Reports having tonsillectomy last Tuesday and coughed today and felt the scab come off and ever since, bright red bleeding

## 2020-07-09 NOTE — ED Notes (Signed)
Up to b/r, steady gait. Speech clear. Handling secretions. Alert, NAD, calm, interactive.

## 2020-07-09 NOTE — ED Notes (Signed)
Attempted IV x2 L hand w/o success.

## 2020-07-09 NOTE — ED Provider Notes (Signed)
[facility] Emergency Department Provider Note  ____________________________________________   Event Date/Time   First MD Initiated Contact with Patient 07/09/20 1733     (approximate)  I have reviewed the triage vital signs and the nursing notes.   HISTORY  Chief Complaint Post-op Problem   HPI Mariah Gonzalez is a 30 y.o. female physical history of ADHD, anxiety, asthma, depression, DU B and IBS who presents for assessment of bleeding from left tonsil after tonsillectomy on 5/24.  Patient states she coughed today and thinks that the scab came off and has been bleeding since.  She has had a very hoarse voice and a little bit of sore throat and has been taking Tylenol ibuprofen.  She denies any other headache, earache, chest pain, breath, Donnell pain, back pain, vomiting, diarrhea or dysuria, rash or any other associated sick symptoms.  She is not on any blood thinners and has an unknown coagulation disorders.  No other acute concerns at this time.      Past Medical History:  Diagnosis Date  . ADHD   . Anxiety   . Asthma   . Depressed   . DUB (dysfunctional uterine bleeding)   . IBS (irritable bowel syndrome)     Patient Active Problem List   Diagnosis Date Noted  . High risk medication use 04/25/2020  . Bereavement 03/21/2020  . Panic attacks 03/21/2020  . Tachycardia 03/21/2020  . Chronic cough 12/19/2019  . Bipolar 1 disorder, depressed, mild (HCC) 11/22/2019  . Pelvic pain in female 10/11/2019  . GERD (gastroesophageal reflux disease) 10/11/2019  . DUB (dysfunctional uterine bleeding) 10/11/2019  . Asthma without status asthmaticus 10/11/2019  . Allergy 10/11/2019  . Bipolar 1 disorder, mixed, moderate (HCC) 10/11/2019  . Social anxiety disorder 10/11/2019  . PTSD (post-traumatic stress disorder) 10/11/2019  . History of ADHD 10/11/2019  . Tobacco use disorder 10/11/2019  . Depression, recurrent (HCC) 08/02/2019  . History  of anemia 08/02/2019  . Migraine 04/23/2011    Past Surgical History:  Procedure Laterality Date  . debridement of rught eye    . TONSILLECTOMY Bilateral 07/03/2020   Procedure: TONSILLECTOMY;  Surgeon: Geanie Logan, MD;  Location: [facility];  Service: ENT;  Laterality: Bilateral;  . WISDOM TOOTH EXTRACTION      Prior to Admission medications   Medication Sig Start Date End Date Taking? Authorizing Provider  HYDROcodone-acetaminophen (HYCET) 7.5-325 mg/15 ml solution Take 20 mLs by mouth every 6 (six) hours as needed for up to 3 days for severe pain. 07/09/20 07/12/20 Yes Gilles Chiquito, MD  albuterol (PROVENTIL) (2.5 MG/3ML) 0.083% nebulizer solution Take 3 mLs (2.5 mg total) by nebulization every 6 (six) hours as needed for wheezing or shortness of breath. 05/11/20   Johnson, Megan P, DO  albuterol (VENTOLIN HFA) 108 (90 Base) MCG/ACT inhaler Inhale 2 puffs into the lungs every 6 (six) hours as needed for wheezing or shortness of breath. 12/19/19   Cannady, Corrie Dandy T, NP  ARIPiprazole (ABILIFY) 5 MG tablet Take 5 mg by mouth at bedtime. 06/07/20   [provider]  azelastine (ASTELIN) 0.1 % nasal spray Place into both nostrils. 06/20/20   [provider]  baclofen (LIORESAL) 10 MG tablet Take 0.5-1 tablets (5-10 mg total) by mouth 3 (three) times daily. 01/31/20   Johnson, Megan P, DO  cetirizine (ZYRTEC) 10 MG tablet Take 10 mg by mouth daily.    [provider]  cyclobenzaprine (FLEXERIL) 10 MG tablet Take 1 tablet (10  mg total) by mouth at bedtime. 05/29/20   Johnson, Megan P, DO  diphenhydramine-acetaminophen (TYLENOL PM) 25-500 MG TABS tablet Take 1 tablet by mouth at bedtime as needed.    [provider]  lamoTRIgine (LAMICTAL) 200 MG tablet TAKE 1 TABLET(200 MG) BY MOUTH DAILY 04/25/20   Jomarie LongsEappen, Saramma, MD  lamoTRIgine (LAMICTAL) 25 MG tablet TAKE 2 TABLETS BY MOUTH EVERY DAY. TO BE COMBINED WITH 200MG  EVERY DAY AT BEDTIME 04/25/20   Jomarie LongsEappen,  Saramma, MD  lidocaine (XYLOCAINE) 2 % solution SMARTSIG:Milliliter(s) Topical Daily PRN 04/19/20   [provider]  LORazepam (ATIVAN) 0.5 MG tablet Take 1 tablet (0.5 mg total) by mouth as directed. Take 1 tablet 1-2 times a day as needed for severe anxiety attacks only - please limit use 03/21/20   Jomarie LongsEappen, Saramma, MD  MELATONIN PO Take by mouth.    [provider]  omeprazole (PRILOSEC) 40 MG capsule Take 1 capsule (40 mg total) by mouth daily. 05/29/20   Olevia PerchesJohnson, Megan P, DO  prednisoLONE (ORAPRED) 15 MG/5ML solution 10 cc PO BID x 3 days, then 5 cc PO BID x 3 days, then 5 cc PO QD x 3 days 07/03/20   Geanie LoganBennett, Paul, MD  traZODone (DESYREL) 50 MG tablet TAKE 1 AND 1/2 TABLETS(75 MG) BY MOUTH AT BEDTIME AS NEEDED FOR SLEEP 05/13/20   Jomarie LongsEappen, Saramma, MD  umeclidinium-vilanterol (ANORO ELLIPTA) 62.5-25 MCG/INH AEPB Inhale 1 puff into the lungs daily. 05/11/20   Johnson, Megan P, DO  venlafaxine XR (EFFEXOR-XR) 75 MG 24 hr capsule TAKE 1 CAPSULE(75 MG) BY MOUTH DAILY WITH BREAKFAST 04/25/20   Jomarie LongsEappen, Saramma, MD    Allergies Eggs or egg-derived products  Family History  Problem Relation Age of Onset  . Hyperlipidemia Mother   . Hypertension Mother   . Depression Mother   . Hyperlipidemia Father   . Hypertension Father   . Mental illness Father   . Cancer Maternal Aunt   . Cancer Maternal Uncle   . Cancer Paternal Aunt   . Hearing loss Paternal Aunt   . Cancer Paternal Uncle   . Hearing loss Paternal Uncle     Social History Social History   Tobacco Use  . Smoking status: Current Every Day Smoker    Packs/day: 0.50    Types: Cigarettes  . Smokeless tobacco: Never Used  Vaping Use  . Vaping Use: Never used  Substance Use Topics  . Alcohol use: Yes    Comment: occ  . Drug use: Yes    Types: Marijuana    Review of Systems  Review of Systems  Constitutional: Negative for chills and fever.  HENT: Positive for sore throat.   Eyes: Negative for pain.   Respiratory: Negative for cough and stridor.   Cardiovascular: Negative for chest pain.  Gastrointestinal: Negative for vomiting.  Genitourinary: Negative for dysuria.  Musculoskeletal: Negative for myalgias.  Skin: Negative for rash.  Neurological: Negative for seizures, loss of consciousness and headaches.  Psychiatric/Behavioral: Negative for suicidal ideas.  All other systems reviewed and are negative.     ____________________________________________   PHYSICAL EXAM:  VITAL SIGNS: ED Triage Vitals  Enc Vitals Group     BP      Pulse      Resp      Temp      Temp src      SpO2      Weight      Height      Head Circumference  Peak Flow      Pain Score      Pain Loc      Pain Edu?      Excl. in GC?    Vitals:   07/09/20 2200 07/09/20 2230  BP: (!) 167/84 (!) 153/83  Pulse: (!) 108 99  Resp: (!) 26 18  Temp:    SpO2: 96% 98%   Physical Exam Vitals and nursing note reviewed.  Constitutional:      General: She is not in acute distress.    Appearance: She is well-developed.  HENT:     Head: Normocephalic and atraumatic.     Right Ear: External ear normal.     Left Ear: External ear normal.     Nose: Nose normal.  Eyes:     Conjunctiva/sclera: Conjunctivae normal.  Cardiovascular:     Rate and Rhythm: Regular rhythm. Tachycardia present.     Heart sounds: No murmur heard.   Pulmonary:     Effort: Pulmonary effort is normal. No respiratory distress.     Breath sounds: Normal breath sounds.  Abdominal:     Palpations: Abdomen is soft.     Tenderness: There is no abdominal tenderness.  Musculoskeletal:     Cervical back: Neck supple.  Skin:    General: Skin is warm and dry.     Capillary Refill: Capillary refill takes less than 2 seconds.  Neurological:     Mental Status: She is alert and oriented to person, place, and time.  Psychiatric:        Mood and Affect: Mood normal.     Cranial nerves II through XII grossly intact.  Patient has some  bilateral erythema around her surgical sites with some bleeding in the left side.  No other active bleeding lesions in the oropharynx. ____________________________________________   LABS (all labs ordered are listed, but only abnormal results are displayed)  Labs Reviewed  CBC WITH DIFFERENTIAL/PLATELET - Abnormal; Notable for the following components:      Result Value   WBC 14.0 (*)    Hemoglobin 11.9 (*)    Neutro Abs 8.6 (*)    Lymphs Abs 4.2 (*)    Abs Immature Granulocytes 0.16 (*)    All other components within normal limits  BASIC METABOLIC PANEL - Abnormal; Notable for the following components:   Glucose, Bld 108 (*)    Calcium 8.7 (*)    All other components within normal limits  PROTIME-INR  TYPE AND SCREEN   ____________________________________________  EKG  ____________________________________________  RADIOLOGY  ED MD interpretation:   Official radiology report(s): No results found.  ____________________________________________   PROCEDURES  Procedure(s) performed (including Critical Care):  .1-3 Lead EKG Interpretation Performed by: Gilles Chiquito, MD Authorized by: Gilles Chiquito, MD     Interpretation: abnormal     ECG rate assessment: tachycardic     Rhythm: sinus tachycardia     Ectopy: none     Conduction: normal       ____________________________________________   INITIAL IMPRESSION / ASSESSMENT AND PLAN / ED COURSE        Patient presents with above-stated history exam for assessment of some postop bleeding after recent tonsillectomy noted above with bleeding visible on the left surgical site on exam.  On arrival she is slightly tachycardic and hypertensive with otherwise stable vital signs on room air.  There is persistent oozing without pulsatile bleeding noted.  No other obvious source of bleeding noted.  CBC obtained shows leukocytosis likely reactive  at 14 and hemoglobin of 10.9 compared to 14.93 months ago.  INR  unremarkable.  BMP unremarkable.  Initial treatment consisting of nebulized TXA and following this patient did gargle some ice water as recommended by on-call ENT Dr. Crosby Oyster.  However following this patient had persistence of her bleeding thus I reached out to Dr. Crosby Oyster again will come see the patient in the ED.  Patient seen by ENT who performed bedside cautery.  Plan is to observe for 1 hour.  Patient is no subsequent bleeding after 1 hour labs.  She was given a refill of her analgesia instructions to follow-up with ENT.  Given absence of recurrence of bleeding with resolution of tachycardia and stable hemoglobin I think she is safe for discharge with close outpatient ENT follow-up.  Discharged stable condition.  Strict return precautions advised and discussed.       ____________________________________________   FINAL CLINICAL IMPRESSION(S) / ED DIAGNOSES  Final diagnoses:  Post-tonsillectomy hemorrhage    Medications  tranexamic acid (CYKLOKAPRON) 1000 MG/10ML nebulizer solution 500 mg (500 mg Nebulization Given 07/09/20 1829)  ondansetron (ZOFRAN) injection 4 mg (4 mg Intravenous Given 07/09/20 1859)  LORazepam (ATIVAN) injection 1 mg (1 mg Intravenous Given 07/09/20 1945)  silver nitrate applicators applicator 3 Stick (3 Sticks Topical Given by Other 07/09/20 1953)     ED Discharge Orders         Ordered    HYDROcodone-acetaminophen (HYCET) 7.5-325 mg/15 ml solution  Every 6 hours PRN        07/09/20 2202           Note:  This document was prepared using Dragon voice recognition software and may include unintentional dictation errors.   Gilles Chiquito, MD 07/09/20 2352

## 2020-07-23 ENCOUNTER — Other Ambulatory Visit: Payer: Self-pay

## 2020-08-03 ENCOUNTER — Ambulatory Visit: Payer: BC Managed Care – PPO | Admitting: Family Medicine

## 2020-08-03 ENCOUNTER — Encounter: Payer: Self-pay | Admitting: Family Medicine

## 2020-08-03 ENCOUNTER — Other Ambulatory Visit: Payer: Self-pay

## 2020-08-03 VITALS — BP 119/79 | HR 98 | Wt 246.0 lb

## 2020-08-03 DIAGNOSIS — R053 Chronic cough: Secondary | ICD-10-CM

## 2020-08-03 MED ORDER — BACLOFEN 10 MG PO TABS: 5.0000 mg | ORAL_TABLET | Freq: Three times a day (TID) | ORAL | 0 refills | Status: DC

## 2020-08-03 MED ORDER — OMEPRAZOLE 40 MG PO CPDR: 40.0000 mg | DELAYED_RELEASE_CAPSULE | Freq: Every day | ORAL | 1 refills | Status: DC

## 2020-08-03 MED ORDER — UMECLIDINIUM-VILANTEROL 62.5-25 MCG/INH IN AEPB: 1.0000 | INHALATION_SPRAY | Freq: Every day | RESPIRATORY_TRACT | 3 refills | Status: DC

## 2020-08-03 MED ORDER — CYCLOBENZAPRINE HCL 10 MG PO TABS: 10.0000 mg | ORAL_TABLET | Freq: Every day | ORAL | 2 refills | Status: DC

## 2020-08-03 NOTE — Patient Instructions (Addendum)
[facility]  Address: [address] [place], [place], [place] [address]  Phone: [phone]  Dr. Sherene Sires- chronic cough pulmonologist [address], [place], [place] [address] Phone: [phone]  [facility] [address], [place], [place] [address] Phone: [phone]

## 2020-08-03 NOTE — Progress Notes (Signed)
BP 119/79   Pulse 98   Wt 246 lb (111.6 kg)   SpO2 98%   BMI 46.48 kg/m    Subjective:    Patient ID: Mariah Gonzalez, female    DOB: Mar 07, 1990, 29 y.o.   MRN: [id number]  HPI: Mariah Gonzalez is a 30 y.o. female  Chief Complaint  Patient presents with   Gastroesophageal Reflux   Cough   Mariah Gonzalez presents for follow up on her cough. She did well with her tonsillectomy. She notes that her cough is better, but it is still not gone. She continues to cough heavily. She notes that it started having issues when she was leaving the house again. She is frustrated as she was hoping that this would resolve with her surgery. No other concerns or complaints at this time.   Relevant past medical, surgical, family and social history reviewed and updated as indicated. Interim medical history since our last visit reviewed. Allergies and medications reviewed and updated.  Review of Systems  Constitutional: Negative.   HENT: Negative.    Respiratory:  Positive for cough and wheezing. Negative for apnea, choking, chest tightness, shortness of breath and stridor.   Cardiovascular: Negative.   Gastrointestinal: Negative.   Neurological: Negative.   Psychiatric/Behavioral: Negative.     Per HPI unless specifically indicated above     Objective:    BP 119/79   Pulse 98   Wt 246 lb (111.6 kg)   SpO2 98%   BMI 46.48 kg/m   Wt Readings from Last 3 Encounters:  08/03/20 246 lb (111.6 kg)  07/09/20 245 lb (111.1 kg)  07/03/20 246 lb (111.6 kg)    Physical Exam Vitals and nursing note reviewed.  Constitutional:      General: She is not in acute distress.    Appearance: Normal appearance. She is not ill-appearing, toxic-appearing or diaphoretic.  HENT:     Head: Normocephalic and atraumatic.     Right Ear: External ear normal.     Left Ear: External ear normal.     Nose: Nose normal.     Mouth/Throat:     Mouth: Mucous membranes are moist.     Pharynx:  Oropharynx is clear.  Eyes:     General: No scleral icterus.       Right eye: No discharge.        Left eye: No discharge.     Extraocular Movements: Extraocular movements intact.     Conjunctiva/sclera: Conjunctivae normal.     Pupils: Pupils are equal, round, and reactive to light.  Cardiovascular:     Rate and Rhythm: Normal rate and regular rhythm.     Pulses: Normal pulses.     Heart sounds: Normal heart sounds. No murmur heard.   No friction rub. No gallop.  Pulmonary:     Effort: Pulmonary effort is normal. No respiratory distress.     Breath sounds: Normal breath sounds. No stridor. No wheezing, rhonchi or rales.  Chest:     Chest wall: No tenderness.  Musculoskeletal:        General: Normal range of motion.     Cervical back: Normal range of motion and neck supple.  Skin:    General: Skin is warm and dry.     Capillary Refill: Capillary refill takes less than 2 seconds.     Coloration: Skin is not jaundiced or pale.     Findings: No bruising, erythema, lesion or rash.  Neurological:     General: No  focal deficit present.     Mental Status: She is alert and oriented to person, place, and time. Mental status is at baseline.  Psychiatric:        Mood and Affect: Mood normal.        Behavior: Behavior normal.        Thought Content: Thought content normal.        Judgment: Judgment normal.    Results for orders placed or performed during the hospital encounter of 07/09/20  CBC with Differential  Result Value Ref Range   WBC 14.0 (H) 4.0 - 10.5 K/uL   RBC 4.26 3.87 - 5.11 MIL/uL   Hemoglobin 11.9 (L) 12.0 - 15.0 g/dL   HCT 65.4 65.0 - 35.4 %   MCV 86.4 80.0 - 100.0 fL   MCH 27.9 26.0 - 34.0 pg   MCHC 32.3 30.0 - 36.0 g/dL   RDW 65.6 81.2 - 75.1 %   Platelets 332 150 - 400 K/uL   nRBC 0.0 0.0 - 0.2 %   Neutrophils Relative % 63 %   Neutro Abs 8.6 (H) 1.7 - 7.7 K/uL   Lymphocytes Relative 30 %   Lymphs Abs 4.2 (H) 0.7 - 4.0 K/uL   Monocytes Relative 6 %    Monocytes Absolute 0.8 0.1 - 1.0 K/uL   Eosinophils Relative 0 %   Eosinophils Absolute 0.0 0.0 - 0.5 K/uL   Basophils Relative 0 %   Basophils Absolute 0.0 0.0 - 0.1 K/uL   Immature Granulocytes 1 %   Abs Immature Granulocytes 0.16 (H) 0.00 - 0.07 K/uL  Basic metabolic panel  Result Value Ref Range   Sodium 137 135 - 145 mmol/L   Potassium 3.6 3.5 - 5.1 mmol/L   Chloride 104 98 - 111 mmol/L   CO2 25 22 - 32 mmol/L   Glucose, Bld 108 (H) 70 - 99 mg/dL   BUN 17 6 - 20 mg/dL   Creatinine, Ser 7.00 0.44 - 1.00 mg/dL   Calcium 8.7 (L) 8.9 - 10.3 mg/dL   GFR, Estimated >17 >49 mL/min   Anion gap 8 5 - 15  Protime-INR  Result Value Ref Range   Prothrombin Time 12.7 11.4 - 15.2 seconds   INR 1.0 0.8 - 1.2  Type and screen [facility]  Result Value Ref Range   ABO/RH(D) O POS    Antibody Screen NEG    Sample Expiration      07/12/2020,2359 Performed at [facility], [address] [place], [place] [address]       Assessment & Plan:   Problem List Items Addressed This Visit       Other   Chronic cough - Primary    Better, but not resolved. We will get her in for allergy testing and refer to pulmonology for evaluation of chronic cough. Await their input.        Relevant Orders   Ambulatory referral to Allergy   Ambulatory referral to Pulmonology     Follow up plan: Return in about 3 months (around 11/03/2020).

## 2020-08-05 NOTE — Assessment & Plan Note (Signed)
Better, but not resolved. We will get her in for allergy testing and refer to pulmonology for evaluation of chronic cough. Await their input.

## 2020-08-31 ENCOUNTER — Encounter: Payer: Self-pay | Admitting: Family Medicine

## 2020-08-31 DIAGNOSIS — S61219A Laceration without foreign body of unspecified finger without damage to nail, initial encounter: Secondary | ICD-10-CM

## 2020-08-31 NOTE — Telephone Encounter (Signed)
It's a good idea since it was 6 years ago

## 2020-09-03 NOTE — Telephone Encounter (Signed)
Pt called back this morning.  She said she did not go get the tetanus shot this weekend.  She wants to know if she can come in the office and get it.  CB#  [phone]

## 2020-11-05 ENCOUNTER — Encounter: Payer: Self-pay | Admitting: Family Medicine

## 2020-11-05 ENCOUNTER — Telehealth (INDEPENDENT_AMBULATORY_CARE_PROVIDER_SITE_OTHER): Payer: BC Managed Care – PPO | Admitting: Family Medicine

## 2020-11-05 DIAGNOSIS — J069 Acute upper respiratory infection, unspecified: Secondary | ICD-10-CM

## 2020-11-05 DIAGNOSIS — R053 Chronic cough: Secondary | ICD-10-CM

## 2020-11-05 DIAGNOSIS — F4310 Post-traumatic stress disorder, unspecified: Secondary | ICD-10-CM

## 2020-11-05 MED ORDER — BACLOFEN 10 MG PO TABS: 5.0000 mg | ORAL_TABLET | Freq: Three times a day (TID) | ORAL | 0 refills | Status: DC

## 2020-11-05 MED ORDER — TRELEGY ELLIPTA 100-62.5-25 MCG/INH IN AEPB
1.0000 | INHALATION_SPRAY | Freq: Every day | RESPIRATORY_TRACT | 11 refills | Status: DC
Start: 2020-11-05 — End: 2021-11-11

## 2020-11-05 MED ORDER — CYCLOBENZAPRINE HCL 10 MG PO TABS: 10.0000 mg | ORAL_TABLET | Freq: Every day | ORAL | 2 refills | Status: DC

## 2020-11-05 MED ORDER — ALBUTEROL SULFATE HFA 108 (90 BASE) MCG/ACT IN AERS: 2.0000 | INHALATION_SPRAY | Freq: Four times a day (QID) | RESPIRATORY_TRACT | 1 refills | Status: DC | PRN

## 2020-11-05 MED ORDER — OMEPRAZOLE 40 MG PO CPDR: 40.0000 mg | DELAYED_RELEASE_CAPSULE | Freq: Every day | ORAL | 1 refills | Status: DC

## 2020-11-05 NOTE — Telephone Encounter (Signed)
OK for virtual if she'd prefer

## 2020-11-05 NOTE — Assessment & Plan Note (Signed)
Continue to follow with psychiatry. Call with any concerns.  ?

## 2020-11-05 NOTE — Progress Notes (Signed)
There were no vitals taken for this visit.   Subjective:    Patient ID: Mariah Gonzalez, female    DOB: 11/11/1990, 30 y.o.   MRN: [id number]  HPI: Mariah Gonzalez is a 30 y.o. female  Chief Complaint  Patient presents with   Cough   Her wife has been sick and has lost her taste and smell.   UPPER RESPIRATORY TRACT INFECTION Duration: 3 days Worst symptom: fever and light headed Fever: yes Cough: yes Shortness of breath: no Wheezing: no Chest pain: no Chest tightness: no Chest congestion: no Nasal congestion: no Runny nose: no Post nasal drip: no Sneezing: no Sore throat: no Swollen glands: no Sinus pressure: no Headache: no Face pain: no Toothache: no Ear pain: no  Ear pressure: no  Eyes red/itching:no Eye drainage/crusting: no  Vomiting: no Rash: no Fatigue: yes Sick contacts: yes Strep contacts: no  Context: worse Recurrent sinusitis: no Relief with OTC cold/cough medications: no  Treatments attempted: cold/sinus, mucinex, and anti-histamine    Relevant past medical, surgical, family and social history reviewed and updated as indicated. Interim medical history since our last visit reviewed. Allergies and medications reviewed and updated.  Review of Systems  Constitutional: Negative.   Respiratory:  Positive for cough and shortness of breath. Negative for apnea, choking, chest tightness, wheezing and stridor.   Cardiovascular: Negative.   Gastrointestinal: Negative.   Musculoskeletal: Negative.   Skin: Negative.   Neurological:  Positive for dizziness and light-headedness. Negative for tremors, seizures, syncope, facial asymmetry, speech difficulty, weakness, numbness and headaches.  Psychiatric/Behavioral: Negative.     Per HPI unless specifically indicated above     Objective:    There were no vitals taken for this visit.  Wt Readings from Last 3 Encounters:  08/03/20 246 lb (111.6 kg)  07/09/20 245 lb (111.1 kg)   07/03/20 246 lb (111.6 kg)    Physical Exam Vitals and nursing note reviewed.  Constitutional:      General: She is not in acute distress.    Appearance: Normal appearance. She is not ill-appearing, toxic-appearing or diaphoretic.  HENT:     Head: Normocephalic and atraumatic.     Right Ear: External ear normal.     Left Ear: External ear normal.     Nose: Nose normal.     Mouth/Throat:     Mouth: Mucous membranes are moist.     Pharynx: Oropharynx is clear.  Eyes:     General: No scleral icterus.       Right eye: No discharge.        Left eye: No discharge.     Conjunctiva/sclera: Conjunctivae normal.     Pupils: Pupils are equal, round, and reactive to light.  Pulmonary:     Effort: Pulmonary effort is normal. No respiratory distress.     Comments: Speaking in full sentences Musculoskeletal:        General: Normal range of motion.     Cervical back: Normal range of motion.  Skin:    Coloration: Skin is not jaundiced or pale.     Findings: No bruising, erythema, lesion or rash.  Neurological:     Mental Status: She is alert and oriented to person, place, and time. Mental status is at baseline.  Psychiatric:        Mood and Affect: Mood normal.        Behavior: Behavior normal.        Thought Content: Thought content normal.  Judgment: Judgment normal.    Results for orders placed or performed during the hospital encounter of 07/09/20  CBC with Differential  Result Value Ref Range   WBC 14.0 (H) 4.0 - 10.5 K/uL   RBC 4.26 3.87 - 5.11 MIL/uL   Hemoglobin 11.9 (L) 12.0 - 15.0 g/dL   HCT 30.0 76.2 - 26.3 %   MCV 86.4 80.0 - 100.0 fL   MCH 27.9 26.0 - 34.0 pg   MCHC 32.3 30.0 - 36.0 g/dL   RDW 33.5 45.6 - 25.6 %   Platelets 332 150 - 400 K/uL   nRBC 0.0 0.0 - 0.2 %   Neutrophils Relative % 63 %   Neutro Abs 8.6 (H) 1.7 - 7.7 K/uL   Lymphocytes Relative 30 %   Lymphs Abs 4.2 (H) 0.7 - 4.0 K/uL   Monocytes Relative 6 %   Monocytes Absolute 0.8 0.1 - 1.0 K/uL    Eosinophils Relative 0 %   Eosinophils Absolute 0.0 0.0 - 0.5 K/uL   Basophils Relative 0 %   Basophils Absolute 0.0 0.0 - 0.1 K/uL   Immature Granulocytes 1 %   Abs Immature Granulocytes 0.16 (H) 0.00 - 0.07 K/uL  Basic metabolic panel  Result Value Ref Range   Sodium 137 135 - 145 mmol/L   Potassium 3.6 3.5 - 5.1 mmol/L   Chloride 104 98 - 111 mmol/L   CO2 25 22 - 32 mmol/L   Glucose, Bld 108 (H) 70 - 99 mg/dL   BUN 17 6 - 20 mg/dL   Creatinine, Ser 3.89 0.44 - 1.00 mg/dL   Calcium 8.7 (L) 8.9 - 10.3 mg/dL   GFR, Estimated >37 >34 mL/min   Anion gap 8 5 - 15  Protime-INR  Result Value Ref Range   Prothrombin Time 12.7 11.4 - 15.2 seconds   INR 1.0 0.8 - 1.2  Type and screen [facility]  Result Value Ref Range   ABO/RH(D) O POS    Antibody Screen NEG    Sample Expiration      07/12/2020,2359 Performed at [facility], [address] [place], [place] [address]       Assessment & Plan:   Problem List Items Addressed This Visit       Other   PTSD (post-traumatic stress disorder)    Continue to follow with psychiatry. Call with any concerns.       Relevant Medications   traZODone (DESYREL) 50 MG tablet   Chronic cough    Slightly better, but not 100%. Will change anoro to trelegy and recheck at physical in 3 months.       Other Visit Diagnoses     Upper respiratory tract infection, unspecified type    -  Primary   Will swab for COVID. Call with any concerns. Symptomatic treatment.    Relevant Orders   Novel Coronavirus, NAA ([facility])        Follow up plan: Return in about 3 months (around 02/04/2021) for physical.    This visit was completed via video visit through MyChart due to the restrictions of the COVID-19 pandemic. All issues as above were discussed and addressed. Physical exam was done as above through visual confirmation on video through MyChart. If it was felt that the patient should be evaluated in the office,  they were directed there. The patient verbally consented to this visit. Location of the patient: home Location of the provider: work Those involved with this call:  Provider: Olevia Perches,  DO CMA:  Hattie Perch, CMA Front Desk/Registration: Sunday Spillers Djimraou  Time spent on call:  25 minutes with patient face to face via video conference. More than 50% of this time was spent in counseling and coordination of care. 40 minutes total spent in review of patient's record and preparation of their chart.

## 2020-11-05 NOTE — Assessment & Plan Note (Signed)
Slightly better, but not 100%. Will change anoro to trelegy and recheck at physical in 3 months.

## 2020-11-06 NOTE — Addendum Note (Signed)
Addended by: Pablo Ledger on: 11/06/2020 03:56 PM   Modules accepted: Orders

## 2020-11-08 LAB — SARS-COV-2, NAA 2 DAY TAT

## 2020-11-08 LAB — NOVEL CORONAVIRUS, NAA: SARS-CoV-2, NAA: NOT DETECTED

## 2020-11-08 NOTE — Telephone Encounter (Signed)
-----   Message from Dorcas Carrow, [place] sent at 11/05/2020  4:51 PM EDT ----- 3 months physical

## 2020-11-28 ENCOUNTER — Ambulatory Visit (INDEPENDENT_AMBULATORY_CARE_PROVIDER_SITE_OTHER): Payer: BC Managed Care – PPO

## 2020-11-28 ENCOUNTER — Other Ambulatory Visit: Payer: Self-pay | Admitting: Family Medicine

## 2020-11-28 ENCOUNTER — Other Ambulatory Visit: Payer: Self-pay

## 2020-11-28 DIAGNOSIS — G43009 Migraine without aura, not intractable, without status migrainosus: Secondary | ICD-10-CM

## 2020-11-28 MED ORDER — KETOROLAC TROMETHAMINE 60 MG/2ML IM SOLN
60.0000 mg | Freq: Once | INTRAMUSCULAR | Status: AC
  Administered 2020-11-28: 60 mg via INTRAMUSCULAR

## 2020-11-28 NOTE — Progress Notes (Signed)
Patient seen today for injection for headache.  Per Dr Laural Benes 60mg  ketorolac is appropriate.  Injection given to patient in left glut.  Patient tolerated well.  Advised patient to call prior to walking into the office for future needs.

## 2020-12-08 ENCOUNTER — Other Ambulatory Visit: Payer: Self-pay | Admitting: Family Medicine

## 2020-12-08 NOTE — Telephone Encounter (Signed)
Requested medication (s) are due for refill today: yes  Requested medication (s) are on the active medication list: yes  Last refill:  11/05/20 #90  Future visit scheduled: yes  Notes to clinic:  med not delegated to NT to RF   Requested Prescriptions  Pending Prescriptions Disp Refills   baclofen (LIORESAL) 10 MG tablet [Pharmacy Med Name: BACLOFEN 10MG  TABLETS] 90 tablet     Sig: TAKE 1/2 TO 1 TABLET(5 TO 10 MG) BY MOUTH THREE TIMES DAILY     Not Delegated - Analgesics:  Muscle Relaxants Failed - 12/08/2020  3:58 PM      Failed - This refill cannot be delegated      Passed - Valid encounter within last 6 months    Recent Outpatient Visits           1 month ago Upper respiratory tract infection, unspecified type   [facility] [place], Megan P, DO   4 months ago Chronic cough   [facility] [place], Megan P, DO   5 months ago Breast lump on left side at 3 o'clock position   [facility], [place] P, DO   6 months ago Chronic cough   [facility] [place], [place], DO   7 months ago Wheezing   [facility] [place], [place], DO       Future Appointments             In 2 months [facility], Laural Benes, DO Oralia Rud, [facility]

## 2020-12-28 ENCOUNTER — Other Ambulatory Visit: Payer: Self-pay | Admitting: Family Medicine

## 2020-12-28 NOTE — Telephone Encounter (Signed)
Requested Prescriptions  Pending Prescriptions Disp Refills  . albuterol (VENTOLIN HFA) 108 (90 Base) MCG/ACT inhaler [Pharmacy Med Name: ALBUTEROL HFA INH(200 PUFFS)18GM] 18 g 1    Sig: INHALE 2 PUFFS INTO THE LUNGS EVERY 6 HOURS AS NEEDED FOR WHEEZING OR SHORTNESS OF BREATH     Pulmonology:  Beta Agonists Failed - 12/28/2020  3:20 AM      Failed - One inhaler should last at least one month. If the patient is requesting refills earlier, contact the patient to check for uncontrolled symptoms.      Passed - Valid encounter within last 12 months    Recent Outpatient Visits          1 month ago Upper respiratory tract infection, unspecified type   [facility] [place], Megan P, DO   4 months ago Chronic cough   [facility] [place], [place], DO   6 months ago Breast lump on left side at 3 o'clock position   [facility], [place] P, DO   7 months ago Chronic cough   [facility] [place], [place], DO   7 months ago Wheezing   [facility] [place], [place], DO      Future Appointments            In 1 month [place], Oralia Rud, DO [employer], [facility]

## 2021-01-24 ENCOUNTER — Encounter: Payer: Self-pay | Admitting: Family Medicine

## 2021-01-24 ENCOUNTER — Ambulatory Visit: Payer: Self-pay | Admitting: *Deleted

## 2021-01-24 DIAGNOSIS — Z8616 Personal history of COVID-19: Secondary | ICD-10-CM

## 2021-01-24 HISTORY — DX: Personal history of COVID-19: Z86.16

## 2021-01-24 NOTE — Telephone Encounter (Signed)
Pt has been in contact with people that are positive for covid / last point of contact was Sunday / pts symptoms started yesterday / she has congestion with mucus, mild cough, Runny nose, / pt was scheduled for virtual appt for tomorrow at 11:20 / pt wants to know if she should get tested / please advise   Called patient to review symptoms and answer questions regarding covid testing. C/o exposure to covid positive person on Sunday. Sx started yesterday with chills, congestion , cough clear to yellow sputum, runny nose. Denies chest pain , difficulty breathing and has not checked for fever. Recommended patient complete at home covid test . Call back if positive. Appt scheduled for 01/25/21. Reviewed isolation precautions for covid if positive result noted. Care advise given. Patient verbalized understanding of care advise and to call back or go to [facility] or ED if symptoms worsen.

## 2021-01-24 NOTE — Telephone Encounter (Signed)
Reason for Disposition  COVID-19 Testing, questions about  Answer Assessment - Initial Assessment Questions 1. COVID-19 EXPOSURE: "Please describe how you were exposed to someone with a COVID-19 infection."     Exposed on Sunday  2. PLACE of CONTACT: "Where were you when you were exposed to COVID-19?" (e.g., home, school, medical waiting room; which city?)     na 3. TYPE of CONTACT: "How much contact was there?" (e.g., sitting next to, live in same house, work in same office, same building)     na 4. DURATION of CONTACT: "How long were you in contact with the COVID-19 patient?" (e.g., a few seconds, passed by person, a few minutes, 15 minutes or longer, live with the patient)     na 5. MASK: "Were you wearing a mask?" "Was the other person wearing a mask?" Note: wearing a mask reduces the risk of an otherwise close contact.     na 6. DATE of CONTACT: "When did you have contact with a COVID-19 patient?" (e.g., how many days ago)     Sunday  7. COMMUNITY SPREAD: "Are there lots of cases of COVID-19 (community spread) where you live?" (See public health department website, if unsure)       na 8. SYMPTOMS: "Do you have any symptoms?" (e.g., fever, cough, breathing difficulty, loss of taste or smell)     Cough congestion, runny nose  9. VACCINE: "Have you gotten the COVID-19 vaccine?" If Yes, ask: "Which one, how many shots, when did you get it?"     na 10. BOOSTER: "Have you received your COVID-19 booster?" If Yes, ask: "Which one and when did you get it?"       na 11. PREGNANCY OR POSTPARTUM: "Is there any chance you are pregnant?" "When was your last menstrual period?" "Did you deliver in the last 2 weeks?"       na 12. HIGH RISK: "Do you have any heart or lung problems?" (e.g., asthma , COPD, heart failure) "Do you have a weak immune system or other risk factors?" (e.g., HIV positive, chemotherapy, renal failure, diabetes mellitus, sickle cell anemia, obesity)       na 13. TRAVEL: "Have  you traveled out of the country recently?" If Yes, ask: "When and where?"  Note: Travel becomes less relevant if there is widespread community transmission where the patient lives.       na  Protocols used: Coronavirus (COVID-19) Exposure-A-AH

## 2021-01-25 ENCOUNTER — Encounter: Payer: Self-pay | Admitting: Family Medicine

## 2021-01-25 ENCOUNTER — Telehealth: Payer: BC Managed Care – PPO | Admitting: Internal Medicine

## 2021-01-25 ENCOUNTER — Telehealth (INDEPENDENT_AMBULATORY_CARE_PROVIDER_SITE_OTHER): Payer: BC Managed Care – PPO | Admitting: Family Medicine

## 2021-01-25 VITALS — BP 139/81 | Temp 99.1°F

## 2021-01-25 DIAGNOSIS — U071 COVID-19: Secondary | ICD-10-CM

## 2021-01-25 MED ORDER — PREDNISONE 10 MG PO TABS: ORAL_TABLET | ORAL | 0 refills | Status: DC

## 2021-01-25 MED ORDER — MOLNUPIRAVIR EUA 200MG CAPSULE: 4.0000 | ORAL_CAPSULE | Freq: Two times a day (BID) | ORAL | 0 refills | Status: AC

## 2021-01-25 MED ORDER — BENZONATATE 200 MG PO CAPS: 200.0000 mg | ORAL_CAPSULE | Freq: Two times a day (BID) | ORAL | 0 refills | Status: DC | PRN

## 2021-01-25 MED ORDER — HYDROCOD POLST-CPM POLST ER 10-8 MG/5ML PO SUER: 5.0000 mL | Freq: Two times a day (BID) | ORAL | 0 refills | Status: DC | PRN

## 2021-01-25 NOTE — Telephone Encounter (Signed)
Pt has been scheduled earlier

## 2021-01-25 NOTE — Progress Notes (Signed)
BP 139/81    Temp 99.1 F (37.3 C) (Axillary)    Subjective:    Patient ID: Mariah Gonzalez, female    DOB: Aug 01, 1990, 30 y.o.   MRN: [id number]  HPI: Mariah Gonzalez is a 30 y.o. female  Chief Complaint  Patient presents with   Cough    Patient states she has been coughing and runny nose Tuesday night. Cough worsened yesterday, patient feels short of breath she took a breathing treatment and feels better.    Diarrhea    Patient states she has diarrhea this morning and vomited on Thursday.    Covid Positive    Patient states she tested positive for COVID last night    UPPER RESPIRATORY TRACT INFECTION Duration: 2 days Worst symptom: SOB, cough Fever: yes Cough: yes Shortness of breath: yes Wheezing: yes Chest pain: yes Chest tightness: yes Chest congestion: yes Nasal congestion: yes Runny nose: yes Post nasal drip: yes Sneezing: yes Sore throat: yes Swollen glands: no Sinus pressure: yes Headache: yes Face pain: no Toothache: no Ear pain: no  Ear pressure: no  Eyes red/itching:no Eye drainage/crusting: no  Vomiting: yes Rash: no Fatigue: yes Sick contacts: yes Strep contacts: no  Context: worse Recurrent sinusitis: no Relief with OTC cold/cough medications: no  Treatments attempted: cold/sinus   Relevant past medical, surgical, family and social history reviewed and updated as indicated. Interim medical history since our last visit reviewed. Allergies and medications reviewed and updated.  Review of Systems  Constitutional:  Positive for chills, diaphoresis, fatigue and fever. Negative for activity change, appetite change and unexpected weight change.  HENT:  Positive for congestion, postnasal drip, rhinorrhea, sinus pressure and sore throat. Negative for dental problem, drooling, ear discharge, ear pain, facial swelling, hearing loss, mouth sores, nosebleeds, sinus pain, sneezing, tinnitus, trouble swallowing and voice change.    Eyes: Negative.   Respiratory:  Positive for cough, chest tightness, shortness of breath and wheezing. Negative for apnea, choking and stridor.   Cardiovascular: Negative.   Gastrointestinal: Negative.   Musculoskeletal: Negative.   Skin: Negative.   Psychiatric/Behavioral: Negative.     Per HPI unless specifically indicated above     Objective:    BP 139/81    Temp 99.1 F (37.3 C) (Axillary)   Wt Readings from Last 3 Encounters:  08/03/20 246 lb (111.6 kg)  07/09/20 245 lb (111.1 kg)  07/03/20 246 lb (111.6 kg)    Physical Exam Vitals and nursing note reviewed.  Constitutional:      General: She is not in acute distress.    Appearance: Normal appearance. She is obese. She is not ill-appearing, toxic-appearing or diaphoretic.  HENT:     Head: Normocephalic and atraumatic.     Right Ear: External ear normal.     Left Ear: External ear normal.     Nose: Nose normal.     Mouth/Throat:     Mouth: Mucous membranes are moist.     Pharynx: Oropharynx is clear.  Eyes:     General: No scleral icterus.       Right eye: No discharge.        Left eye: No discharge.     Conjunctiva/sclera: Conjunctivae normal.     Pupils: Pupils are equal, round, and reactive to light.  Pulmonary:     Effort: Pulmonary effort is normal. No respiratory distress.     Comments: Speaking in full sentences, +cough Musculoskeletal:        General: Normal range of  motion.     Cervical back: Normal range of motion.  Skin:    Coloration: Skin is not jaundiced or pale.     Findings: No bruising, erythema, lesion or rash.  Neurological:     Mental Status: She is alert and oriented to person, place, and time. Mental status is at baseline.  Psychiatric:        Mood and Affect: Mood normal.        Behavior: Behavior normal.        Thought Content: Thought content normal.        Judgment: Judgment normal.    Results for orders placed or performed in visit on 11/05/20  Novel Coronavirus, NAA ([facility])    Specimen: Saline  Result Value Ref Range   SARS-CoV-2, NAA Not Detected Not Detected  SARS-COV-2, NAA 2 DAY TAT  Result Value Ref Range   SARS-CoV-2, NAA 2 DAY TAT Performed       Assessment & Plan:   Problem List Items Addressed This Visit   None Visit Diagnoses     COVID-19    -  Primary   Will treat with mulnopirovir and prednisone, tussionex and tessalon perles. Call with any concerns. Continue to monitor.    Relevant Medications   molnupiravir EUA (LAGEVRIO) 200 mg CAPS capsule        Follow up plan: Return if symptoms worsen or fail to improve.    This visit was completed via video visit through MyChart due to the restrictions of the COVID-19 pandemic. All issues as above were discussed and addressed. Physical exam was done as above through visual confirmation on video through MyChart. If it was felt that the patient should be evaluated in the office, they were directed there. The patient verbally consented to this visit. Location of the patient: home Location of the provider: work Those involved with this call:  Provider: Olevia Perches, DO CMA: Rolley Sims, CMA Front Desk/Registration: [employer]  Time spent on call:  15 minutes with patient face to face via video conference. More than 50% of this time was spent in counseling and coordination of care. 23 minutes total spent in review of patient's record and preparation of their chart.

## 2021-01-25 NOTE — Telephone Encounter (Signed)
Please get her scheduled for a virtual and to come in for swabs. OK to put at 10:20 or to double book a sick visit

## 2021-01-25 NOTE — Telephone Encounter (Signed)
Advised pt that she can come get swabbed at 11 today before her appt.

## 2021-01-29 ENCOUNTER — Other Ambulatory Visit: Payer: Self-pay | Admitting: Family Medicine

## 2021-01-29 NOTE — Telephone Encounter (Signed)
Requested Prescriptions  Pending Prescriptions Disp Refills   omeprazole (PRILOSEC) 40 MG capsule [Pharmacy Med Name: OMEPRAZOLE 40MG  CAPSULES] 90 capsule 1    Sig: TAKE 1 CAPSULE(40 MG) BY MOUTH DAILY     Gastroenterology: Proton Pump Inhibitors Passed - 01/29/2021  7:36 AM      Passed - Valid encounter within last 12 months    Recent Outpatient Visits          4 days ago COVID-19   [facility], Megan P, DO   2 months ago Upper respiratory tract infection, unspecified type   [facility], Megan P, DO   5 months ago Chronic cough   [facility] [place], [place], DO   7 months ago Breast lump on left side at 3 o'clock position   [facility], [place], DO   8 months ago Chronic cough   [facility] [place], [place], DO      Future Appointments            In 1 week [facility] y[facility], Laural Benes, DO Oralia Rud, [facility]

## 2021-02-07 ENCOUNTER — Ambulatory Visit (INDEPENDENT_AMBULATORY_CARE_PROVIDER_SITE_OTHER): Payer: BC Managed Care – PPO | Admitting: Family Medicine

## 2021-02-07 ENCOUNTER — Encounter: Payer: Self-pay | Admitting: Family Medicine

## 2021-02-07 ENCOUNTER — Other Ambulatory Visit: Payer: Self-pay

## 2021-02-07 VITALS — BP 133/87 | HR 113 | Ht 61.0 in | Wt 245.0 lb

## 2021-02-07 DIAGNOSIS — Z0000 Encounter for general adult medical examination without abnormal findings: Secondary | ICD-10-CM | POA: Diagnosis not present

## 2021-02-07 DIAGNOSIS — F3162 Bipolar disorder, current episode mixed, moderate: Secondary | ICD-10-CM | POA: Diagnosis not present

## 2021-02-07 DIAGNOSIS — M79671 Pain in right foot: Secondary | ICD-10-CM

## 2021-02-07 DIAGNOSIS — M79672 Pain in left foot: Secondary | ICD-10-CM | POA: Diagnosis not present

## 2021-02-07 DIAGNOSIS — R053 Chronic cough: Secondary | ICD-10-CM

## 2021-02-07 LAB — URINALYSIS, ROUTINE W REFLEX MICROSCOPIC
Bilirubin, UA: NEGATIVE
Glucose, UA: NEGATIVE
Ketones, UA: NEGATIVE
Leukocytes,UA: NEGATIVE
Nitrite, UA: NEGATIVE
Protein,UA: NEGATIVE
RBC, UA: NEGATIVE
Specific Gravity, UA: 1.015 (ref 1.005–1.030)
Urobilinogen, Ur: 0.2 mg/dL (ref 0.2–1.0)
pH, UA: 7 (ref 5.0–7.5)

## 2021-02-07 MED ORDER — TRAZODONE HCL 50 MG PO TABS: 100.0000 mg | ORAL_TABLET | Freq: Every day | ORAL | 0 refills | Status: DC

## 2021-02-07 MED ORDER — PREDNISONE 50 MG PO TABS: 50.0000 mg | ORAL_TABLET | Freq: Every day | ORAL | 0 refills | Status: DC

## 2021-02-07 NOTE — Patient Instructions (Signed)
Dr. Sherene Sires- chronic cough pulmonologist [address], [place], [place] [address] Phone: [phone]

## 2021-02-07 NOTE — Progress Notes (Signed)
BP 133/87    Pulse (!) 113    Ht 5\' 1"  (1.549 m)    Wt 245 lb (111.1 kg)    SpO2 97%    BMI 46.29 kg/m    Subjective:    Patient ID: Gonzalez, female    DOB: [birth year], 30 y.o.   MRN: [id number]  HPI: Mariah Gonzalez is a 30 y.o. female presenting on 02/07/2021 for comprehensive medical examination. Current medical complaints include:  UPPER RESPIRATORY TRACT INFECTION Duration: 3-4 day Worst symptom: cough and tickle in her throat Fever: no Cough: yes Shortness of breath: yes Wheezing: yes Chest pain: yes, with cough Chest tightness: yes Chest congestion: yes Nasal congestion: yes Runny nose: yes Post nasal drip: yes Sneezing: yes Sore throat: no Swollen glands: no Sinus pressure: yes Headache: no Face pain: no Toothache: yes Ear pain: yes left Ear pressure: yes left Eyes red/itching:no Eye drainage/crusting: no  Vomiting: no Rash: no Fatigue: yes Sick contacts: yes Strep contacts: no  Context: stable Recurrent sinusitis: no Relief with OTC cold/cough medications: no  Treatments attempted: none   She currently lives with: wife Menopausal Symptoms: no  Depression Screen done today and results listed below:  Depression screen [facility] 2/9 02/07/2021 08/03/2020 02/23/2020 01/31/2020 08/02/2019  Decreased Interest 2 1 0 3 1  Down, Depressed, Hopeless 2 0 2 3 1   PHQ - 2 Score 4 1 2 6 2   Altered sleeping 2 3 3 3 2   Tired, decreased energy 2 2 3 3 2   Change in appetite 2 3 3 3 1   Feeling bad or failure about yourself  2 0 1 1 1   Trouble concentrating 2 0 1 0 1  Moving slowly or fidgety/restless 2 0 0 0 2  Suicidal thoughts 0 0 0 0 0  PHQ-9 Score 16 9 13 16 11   Difficult doing work/chores - Not difficult at all - Somewhat difficult Somewhat difficult  Some encounter information is confidential and restricted. Go to Review Flowsheets activity to see all data.    Past Medical History:  Past Medical History:  Diagnosis Date   ADHD     Anxiety    Asthma    Depressed    DUB (dysfunctional uterine bleeding)    IBS (irritable bowel syndrome)     Surgical History:  Past Surgical History:  Procedure Laterality Date   debridement of rught eye     TONSILLECTOMY Bilateral 07/03/2020   Procedure: TONSILLECTOMY;  Surgeon: , MD;  [facility];  Service: ENT;  Laterality: Bilateral;   WISDOM TOOTH EXTRACTION      Medications:  Current Outpatient Medications on File Prior to Visit  Medication Sig   albuterol (PROVENTIL) (2.5 MG/3ML) 0.083% nebulizer solution Take 3 mLs (2.5 mg total) by nebulization every 6 (six) hours as needed for wheezing or shortness of breath.   albuterol (VENTOLIN HFA) 108 (90 Base) MCG/ACT inhaler INHALE 2 PUFFS INTO THE LUNGS EVERY 6 HOURS AS NEEDED FOR WHEEZING OR SHORTNESS OF BREATH   amphetamine-dextroamphetamine (ADDERALL XR) 15 MG 24 hr capsule Take by mouth every morning.   amphetamine-dextroamphetamine (ADDERALL) 5 MG tablet Take 1 tablet by mouth daily.   baclofen (LIORESAL) 10 MG tablet TAKE 1/2 TO 1 TABLET(5 TO 10 MG) BY MOUTH THREE TIMES DAILY   benzonatate (TESSALON) 200 MG capsule Take 1 capsule (200 mg total) by mouth 2 (two) times daily as needed for cough.   cyclobenzaprine (FLEXERIL) 10 MG tablet Take 1 tablet (10 mg  total) by mouth at bedtime.   diphenhydramine-acetaminophen (TYLENOL PM) 25-500 MG TABS tablet Take 1 tablet by mouth at bedtime as needed.   Fluticasone-Umeclidin-Vilant (TRELEGY ELLIPTA) 100-62.5-25 MCG/INH AEPB Inhale 1 puff into the lungs daily.   gabapentin (NEURONTIN) 100 MG capsule Take 1 capsule (100 mg total) by mouth at bedtime.   lamoTRIgine (LAMICTAL) 200 MG tablet TAKE 1 TABLET(200 MG) BY MOUTH DAILY   lamoTRIgine (LAMICTAL) 25 MG tablet TAKE 2 TABLETS BY MOUTH EVERY DAY. TO BE COMBINED WITH  EVERY DAY AT BEDTIME   LATUDA 20 MG TABS tablet Take 20 mg by mouth daily.   lidocaine (XYLOCAINE) 2 % solution SMARTSIG:Milliliter(s)  Topical Daily PRN   MELATONIN PO Take by mouth.   omeprazole (PRILOSEC) 40 MG capsule TAKE 1 CAPSULE(40 MG) BY MOUTH DAILY   LORazepam (ATIVAN) 0.5 MG tablet Take 1 tablet (0.5 mg total) by mouth as directed. Take 1 tablet 1-2 times a day as needed for severe anxiety attacks only - please limit use (Patient not taking: Reported on 02/07/2021)   No current facility-administered medications on file prior to visit.    Allergies:  Allergies  Allergen Reactions   Eggs Or Egg-Derived Products Diarrhea    Social History:  Social History   Socioeconomic History   Marital status: Married    Spouse name: Not on file   Number of children: Not on file   Years of education: Not on file   Highest education level: Not on file  Occupational History   Not on file  Tobacco Use   Smoking status: Every Day    Packs/day: 0.50    Types: Cigarettes   Smokeless tobacco: Never  Vaping Use   Vaping Use: Never used  Substance and Sexual Activity   Alcohol use: Not Currently   Drug use: Yes    Types: Marijuana   Sexual activity: Yes  Other Topics Concern   Not on file  Social History Narrative   Not on file   Social Determinants of Health   Financial Resource Strain: Not on file  Food Insecurity: Not on file  Transportation Needs: Not on file  Physical Activity: Not on file  Stress: Not on file  Social Connections: Not on file  Intimate Partner Violence: Not on file   Social History   Tobacco Use  Smoking Status Every Day   Packs/day: 0.50   Types: Cigarettes  Smokeless Tobacco Never   Social History   Substance and Sexual Activity  Alcohol Use Not Currently    Family History:  Family History  Problem Relation Age of Onset   Hyperlipidemia Mother    Hypertension Mother    Depression Mother    Hyperlipidemia Father    Hypertension Father    Mental illness Father    Cancer Maternal Aunt    Cancer Maternal Uncle    Cancer Paternal Aunt    Hearing loss Paternal Aunt     Cancer Paternal Uncle    Hearing loss Paternal Uncle     Past medical history, surgical history, medications, allergies, family history and social history reviewed with patient today and changes made to appropriate areas of the chart.   Review of Systems  Constitutional:  Positive for diaphoresis. Negative for chills, fever, malaise/fatigue and weight loss.  HENT:  Positive for congestion, ear pain and sinus pain. Negative for ear discharge, hearing loss, nosebleeds, sore throat and tinnitus.   Eyes: Negative.   Respiratory:  Positive for cough, shortness of breath and wheezing. Negative  for hemoptysis, sputum production and stridor.   Cardiovascular: Negative.   Gastrointestinal:  Positive for heartburn. Negative for abdominal pain, blood in stool, constipation, diarrhea, melena, nausea and vomiting.  Genitourinary: Negative.   Musculoskeletal: Negative.   Skin: Negative.   Neurological:  Positive for dizziness. Negative for tingling, tremors, sensory change, speech change, focal weakness, seizures, loss of consciousness, weakness and headaches.  Endo/Heme/Allergies:  Negative for environmental allergies and polydipsia. Bruises/bleeds easily.  Psychiatric/Behavioral:  Positive for depression. Negative for hallucinations, memory loss, substance abuse and suicidal ideas. The patient is nervous/anxious. The patient does not have insomnia.   All other ROS negative except what is listed above and in the HPI.      Objective:    BP 133/87    Pulse (!) 113    Ht 5\' 1"  (1.549 m)    Wt 245 lb (111.1 kg)    SpO2 97%    BMI 46.29 kg/m   Wt Readings from Last 3 Encounters:  02/07/21 245 lb (111.1 kg)  08/03/20 246 lb (111.6 kg)  07/09/20 245 lb (111.1 kg)    Physical Exam Vitals and nursing note reviewed.  Constitutional:      General: She is not in acute distress.    Appearance: Normal appearance. She is not ill-appearing, toxic-appearing or diaphoretic.  HENT:     Head: Normocephalic and  atraumatic.     Right Ear: Tympanic membrane, ear canal and external ear normal. There is no impacted cerumen.     Left Ear: Tympanic membrane, ear canal and external ear normal. There is no impacted cerumen.     Nose: Nose normal. No congestion or rhinorrhea.     Mouth/Throat:     Mouth: Mucous membranes are moist.     Pharynx: Oropharynx is clear. No oropharyngeal exudate or posterior oropharyngeal erythema.  Eyes:     General: No scleral icterus.       Right eye: No discharge.        Left eye: No discharge.     Extraocular Movements: Extraocular movements intact.     Conjunctiva/sclera: Conjunctivae normal.     Pupils: Pupils are equal, round, and reactive to light.  Neck:     Vascular: No carotid bruit.  Cardiovascular:     Rate and Rhythm: Normal rate and regular rhythm.     Pulses: Normal pulses.     Heart sounds: No murmur heard.   No friction rub. No gallop.  Pulmonary:     Effort: Pulmonary effort is normal. No respiratory distress.     Breath sounds: No stridor. Wheezing present. No rhonchi or rales.  Chest:     Chest wall: No tenderness.  Abdominal:     General: Abdomen is flat. Bowel sounds are normal. There is no distension.     Palpations: Abdomen is soft. There is no mass.     Tenderness: There is no abdominal tenderness. There is no right CVA tenderness, left CVA tenderness, guarding or rebound.     Hernia: No hernia is present.  Genitourinary:    Comments: Breast and pelvic exams deferred with shared decision making Musculoskeletal:        General: No swelling, tenderness, deformity or signs of injury.     Cervical back: Normal range of motion and neck supple. No rigidity. No muscular tenderness.     Right lower leg: No edema.     Left lower leg: No edema.  Lymphadenopathy:     Cervical: No cervical adenopathy.  Skin:  General: Skin is warm and dry.     Capillary Refill: Capillary refill takes less than 2 seconds.     Coloration: Skin is not jaundiced or  pale.     Findings: No bruising, erythema, lesion or rash.  Neurological:     General: No focal deficit present.     Mental Status: She is alert and oriented to person, place, and time. Mental status is at baseline.     Cranial Nerves: No cranial nerve deficit.     Sensory: No sensory deficit.     Motor: No weakness.     Coordination: Coordination normal.     Gait: Gait normal.     Deep Tendon Reflexes: Reflexes normal.  Psychiatric:        Mood and Affect: Mood normal.        Behavior: Behavior normal.        Thought Content: Thought content normal.        Judgment: Judgment normal.    Results for orders placed or performed in visit on 11/05/20  Novel Coronavirus, NAA ([facility])   Specimen: Saline  Result Value Ref Range   SARS-CoV-2, NAA Not Detected Not Detected  SARS-COV-2, NAA 2 DAY TAT  Result Value Ref Range   SARS-CoV-2, NAA 2 DAY TAT Performed       Assessment & Plan:   Problem List Items Addressed This Visit       Other   Bipolar 1 disorder, mixed, moderate (HCC)    Continue to follow with psychiatry. Call with any concerns. Continue to monitor.       Relevant Medications   traZODone (DESYREL) 50 MG tablet   Chronic cough    Acting up again. Will treat with prednisone. Call if not getting better or getting worse. Encouraged her to call pulmonology to set up appointment. Call with any concerns.       Other Visit Diagnoses     Routine general medical examination at a health care facility    -  Primary   Vaccines up to date. Screening labs checked today. Pap up to date. Continue diet and exercise. Call with any concerns.    Relevant Orders   CBC with Differential/Platelet   Comprehensive metabolic panel   Lipid Panel w/o Chol/HDL Ratio   Urinalysis, Routine w reflex microscopic   TSH   Iron Binding Cap (TIBC)([facility])   Ferritin   Bilateral foot pain       Encouraged pads and wide toe box shoes. Call if not getting better and we'll refer to  podiatry. Call with any concerns.         Follow up plan: Return in about 6 months (around 08/08/2021).   LABORATORY TESTING:  - Pap smear: up to date  IMMUNIZATIONS:   - Tdap: Tetanus vaccination status reviewed: last tetanus booster within 10 years. - Influenza: unable to get due to allergy - Pneumovax: Up to date - Prevnar: Not applicable - COVID: Up to date  PATIENT COUNSELING:   Advised to take 1 mg of folate supplement per day if capable of pregnancy.   Sexuality: Discussed sexually transmitted diseases, partner selection, use of condoms, avoidance of unintended pregnancy  and contraceptive alternatives.   Advised to avoid cigarette smoking.  I discussed with the patient that most people either abstain from alcohol or drink within safe limits (<=14/week and <=4 drinks/occasion for males, <=7/weeks and <= 3 drinks/occasion for females) and that the risk for alcohol disorders and other health effects rises proportionally  with the number of drinks per week and how often a drinker exceeds daily limits.  Discussed cessation/primary prevention of drug use and availability of treatment for abuse.   Diet: Encouraged to adjust caloric intake to maintain  or achieve ideal body weight, to reduce intake of dietary saturated fat and total fat, to limit sodium intake by avoiding high sodium foods and not adding table salt, and to maintain adequate dietary potassium and calcium preferably from fresh fruits, vegetables, and low-fat dairy products.    stressed the importance of regular exercise  Injury prevention: Discussed safety belts, safety helmets, smoke detector, smoking near bedding or upholstery.   Dental health: Discussed importance of regular tooth brushing, flossing, and dental visits.    NEXT PREVENTATIVE PHYSICAL DUE IN 1 YEAR. Return in about 6 months (around 08/08/2021).

## 2021-02-07 NOTE — Assessment & Plan Note (Signed)
Acting up again. Will treat with prednisone. Call if not getting better or getting worse. Encouraged her to call pulmonology to set up appointment. Call with any concerns.

## 2021-02-07 NOTE — Assessment & Plan Note (Signed)
Continue to follow with psychiatry. Call with any concerns. Continue to monitor.  

## 2021-02-08 LAB — CBC WITH DIFFERENTIAL/PLATELET
Basophils Absolute: 0.1 10*3/uL (ref 0.0–0.2)
Basos: 0 %
EOS (ABSOLUTE): 0 10*3/uL (ref 0.0–0.4)
Eos: 0 %
Hematocrit: 35.2 % (ref 34.0–46.6)
Hemoglobin: 11.2 g/dL (ref 11.1–15.9)
Immature Grans (Abs): 0.2 10*3/uL — ABNORMAL HIGH (ref 0.0–0.1)
Immature Granulocytes: 1 %
Lymphocytes Absolute: 2.9 10*3/uL (ref 0.7–3.1)
Lymphs: 20 %
MCH: 23.9 pg — ABNORMAL LOW (ref 26.6–33.0)
MCHC: 31.8 g/dL (ref 31.5–35.7)
MCV: 75 fL — ABNORMAL LOW (ref 79–97)
Monocytes Absolute: 0.9 10*3/uL (ref 0.1–0.9)
Monocytes: 7 %
Neutrophils Absolute: 10.2 10*3/uL — ABNORMAL HIGH (ref 1.4–7.0)
Neutrophils: 72 %
Platelets: 358 10*3/uL (ref 150–450)
RBC: 4.68 x10E6/uL (ref 3.77–5.28)
RDW: 15.8 % — ABNORMAL HIGH (ref 11.7–15.4)
WBC: 14.3 10*3/uL — ABNORMAL HIGH (ref 3.4–10.8)

## 2021-02-08 LAB — COMPREHENSIVE METABOLIC PANEL
ALT: 18 IU/L (ref 0–32)
AST: 19 IU/L (ref 0–40)
Albumin/Globulin Ratio: 1.5 (ref 1.2–2.2)
Albumin: 3.7 g/dL — ABNORMAL LOW (ref 3.9–5.0)
Alkaline Phosphatase: 103 IU/L (ref 44–121)
BUN/Creatinine Ratio: 17 (ref 9–23)
BUN: 11 mg/dL (ref 6–20)
Bilirubin Total: <0.2 mg/dL (ref 0.0–1.2)
CO2: 24 mmol/L (ref 20–29)
Calcium: 8.7 mg/dL (ref 8.7–10.2)
Chloride: 99 mmol/L (ref 96–106)
Creatinine, Ser: 0.64 mg/dL (ref 0.57–1.00)
Globulin, Total: 2.4 g/dL (ref 1.5–4.5)
Glucose: 82 mg/dL (ref 70–99)
Potassium: 4.7 mmol/L (ref 3.5–5.2)
Sodium: 137 mmol/L (ref 134–144)
Total Protein: 6.1 g/dL (ref 6.0–8.5)
eGFR: 123 mL/min/{1.73_m2} (ref >59)

## 2021-02-08 LAB — IRON AND TIBC
Iron Saturation: 6 % — CL (ref 15–55)
Iron: 18 ug/dL — ABNORMAL LOW (ref 27–159)
Total Iron Binding Capacity: 309 ug/dL (ref 250–450)
UIBC: 291 ug/dL (ref 131–425)

## 2021-02-08 LAB — LIPID PANEL W/O CHOL/HDL RATIO
Cholesterol, Total: 244 mg/dL — ABNORMAL HIGH (ref 100–199)
HDL: 30 mg/dL — ABNORMAL LOW (ref >39)
Triglycerides: 1248 mg/dL (ref 0–149)

## 2021-02-08 LAB — TSH: TSH: 1.76 u[IU]/mL (ref 0.450–4.500)

## 2021-02-08 LAB — FERRITIN: Ferritin: 9 ng/mL — ABNORMAL LOW (ref 15–150)

## 2021-02-09 ENCOUNTER — Other Ambulatory Visit: Payer: Self-pay | Admitting: Family Medicine

## 2021-02-09 NOTE — Telephone Encounter (Signed)
Requested medication (s) are due for refill today: yes  Requested medication (s) are on the active medication list: yes  Last refill:  12/10/20 #90 1 RF  Future visit scheduled: yes  Notes to clinic:  med not delegated to NT to RF   Requested Prescriptions  Pending Prescriptions Disp Refills   baclofen (LIORESAL) 10 MG tablet [Pharmacy Med Name: BACLOFEN 10MG  TABLETS] 90 tablet 1    Sig: TAKE 1/2 TO 1 TABLET(5 TO 10 MG) BY MOUTH THREE TIMES DAILY     Not Delegated - Analgesics:  Muscle Relaxants Failed - 02/09/2021  3:20 PM      Failed - This refill cannot be delegated      Passed - Valid encounter within last 6 months    Recent Outpatient Visits           2 days ago Routine general medical examination at a health care facility   [facility] [place], Megan P, DO   2 weeks ago COVID-19   [facility], Megan P, DO   3 months ago Upper respiratory tract infection, unspecified type   [facility], Megan P, DO   6 months ago Chronic cough   [facility] [place], [place], DO   7 months ago Breast lump on left side at 3 o'clock position   [facility] [place], [place], DO       Future Appointments             In 6 months [facility], Laural Benes, DO Oralia Rud, [facility]

## 2021-02-12 ENCOUNTER — Other Ambulatory Visit: Payer: Self-pay | Admitting: Family Medicine

## 2021-02-12 DIAGNOSIS — E611 Iron deficiency: Secondary | ICD-10-CM

## 2021-02-12 MED ORDER — FENOFIBRATE 145 MG PO TABS: 145.0000 mg | ORAL_TABLET | Freq: Every day | ORAL | 3 refills | Status: DC

## 2021-02-15 ENCOUNTER — Encounter: Payer: Self-pay | Admitting: *Deleted

## 2021-02-16 ENCOUNTER — Other Ambulatory Visit: Payer: Self-pay | Admitting: Family Medicine

## 2021-02-16 NOTE — Telephone Encounter (Signed)
Requested Prescriptions  Pending Prescriptions Disp Refills   albuterol (VENTOLIN HFA) 108 (90 Base) MCG/ACT inhaler [Pharmacy Med Name: ALBUTEROL HFA INH(200 PUFFS)18GM] 18 g 1    Sig: INHALE 2 PUFFS INTO THE LUNGS EVERY 6 HOURS AS NEEDED FOR WHEEZING OR SHORTNESS OF BREATH     Pulmonology:  Beta Agonists Failed - 02/16/2021  3:20 AM      Failed - One inhaler should last at least one month. If the patient is requesting refills earlier, contact the patient to check for uncontrolled symptoms.      Passed - Valid encounter within last 12 months    Recent Outpatient Visits          1 week ago Routine general medical examination at a health care facility   [facility], [place] P, DO   3 weeks ago COVID-19   [facility] [place], [place] P, DO   3 months ago Upper respiratory tract infection, unspecified type   [facility] Megan P, DO   6 months ago Chronic cough   [facility] [place], [place], DO   7 months ago Breast lump on left side at 3 o'clock position   [facility] [place], [place], DO      Future Appointments            In 5 months Laural Benes, Oralia Rud, DO [employer], [facility]

## 2021-02-20 ENCOUNTER — Other Ambulatory Visit: Payer: Self-pay

## 2021-02-20 ENCOUNTER — Inpatient Hospital Stay: Payer: BC Managed Care – PPO

## 2021-02-20 ENCOUNTER — Inpatient Hospital Stay: Payer: BC Managed Care – PPO | Attending: Oncology | Admitting: Oncology

## 2021-02-20 ENCOUNTER — Encounter: Payer: Self-pay | Admitting: Oncology

## 2021-02-20 VITALS — BP 135/94 | HR 98 | Temp 97.4°F | Resp 18 | Wt 240.5 lb

## 2021-02-20 DIAGNOSIS — N920 Excessive and frequent menstruation with regular cycle: Secondary | ICD-10-CM | POA: Insufficient documentation

## 2021-02-20 DIAGNOSIS — F17210 Nicotine dependence, cigarettes, uncomplicated: Secondary | ICD-10-CM | POA: Diagnosis not present

## 2021-02-20 DIAGNOSIS — D649 Anemia, unspecified: Secondary | ICD-10-CM | POA: Insufficient documentation

## 2021-02-20 DIAGNOSIS — D509 Iron deficiency anemia, unspecified: Secondary | ICD-10-CM | POA: Diagnosis present

## 2021-02-20 DIAGNOSIS — Z803 Family history of malignant neoplasm of breast: Secondary | ICD-10-CM | POA: Insufficient documentation

## 2021-02-20 DIAGNOSIS — D508 Other iron deficiency anemias: Secondary | ICD-10-CM

## 2021-02-20 DIAGNOSIS — E611 Iron deficiency: Secondary | ICD-10-CM

## 2021-02-20 LAB — FERRITIN: Ferritin: 5 ng/mL — ABNORMAL LOW (ref 11–307)

## 2021-02-20 NOTE — Progress Notes (Signed)
Hematology/Oncology Consult note [facility] [phone] [phone]   Patient Care Team: Valerie Roys, DO as PCP - General (Family Medicine) Earlie Server, MD as Consulting Physician (Hematology)  REFERRING PROVIDER: Valerie Roys, DO CHIEF COMPLAINTS/REASON FOR VISIT:  Evaluation of iron deficiency anemia  HISTORY OF PRESENTING ILLNESS:  Mariah Gonzalez is a  31 y.o.  female with PMH listed below was seen in consultation at the request of Valerie Roys, DO  for evaluation of iron deficiency anemia.   Reviewed patient's recent labs  02/07/2021 labs revealed anemia with hemoglobin of 11.2 [reference 11.1-15.9g/dl] MCV 75, white count 14.3, normal platelet count 358,000. Iron saturation 6, ferritin of 9.  Patient reports heavy menstrual period, also chronic pelvic pain.  She is currently not following up gynecology after Dr. Leonides Schanz relocated. Reports feeling tired and fatigued. She recalls having some bleeding complication after tonsil resection.  Recent blood work also showed extremely high triglyceride, >1000.  Primary care provider has prescribed fenofibrate. History of IBS, has previously tried oral iron supplementation, not able to tolerate due to GI side effects.  Review of Systems  Constitutional:  Positive for fatigue. Negative for appetite change, chills and fever.  HENT:   Negative for hearing loss and voice change.   Eyes:  Negative for eye problems.  Respiratory:  Negative for chest tightness and cough.   Cardiovascular:  Negative for chest pain.  Gastrointestinal:  Negative for abdominal distention, abdominal pain and blood in stool.  Endocrine: Negative for hot flashes.  Genitourinary:  Positive for menstrual problem. Negative for difficulty urinating and frequency.        Chronic pelvic pain  Musculoskeletal:  Negative for arthralgias.  Skin:  Negative for itching and rash.  Neurological:  Negative for  extremity weakness.  Hematological:  Negative for adenopathy. Bruises/bleeds easily.  Psychiatric/Behavioral:  Negative for confusion.    MEDICAL HISTORY:  Past Medical History:  Diagnosis Date   ADHD    Anxiety    Asthma    Depressed    DUB (dysfunctional uterine bleeding)    Dysfunctional uterine bleeding    GERD (gastroesophageal reflux disease)    History of COVID-19 01/24/2021   IBS (irritable bowel syndrome)    IDA (iron deficiency anemia)    LGSIL on Pap smear of cervix    Migraine without aura and without status migrainosus, not intractable    Pelvic pain in female     SURGICAL HISTORY: Past Surgical History:  Procedure Laterality Date   debridement of rught eye     gum grafting     TONSILLECTOMY Bilateral 07/03/2020   Procedure: TONSILLECTOMY;  Surgeon: Clyde Canterbury, MD;  Location: [place];  Service: ENT;  Laterality: Bilateral;   WISDOM TOOTH EXTRACTION      SOCIAL HISTORY: Social History   Socioeconomic History   Marital status: Married    Spouse name: Not on file   Number of children: Not on file   Years of education: Not on file   Highest education level: Not on file  Occupational History   Not on file  Tobacco Use   Smoking status: Every Day    Packs/day: 0.50    Years: 2.00    Pack years: 1.00    Types: Cigarettes    Passive exposure: Never   Smokeless tobacco: Never  Vaping Use   Vaping Use: Never used  Substance and Sexual Activity   Alcohol use: Not Currently   Drug use: Yes  Types: Marijuana    Comment: [place] 8   Sexual activity: Yes  Other Topics Concern   Not on file  Social History Narrative   Not on file   Social Determinants of Health   Financial Resource Strain: Not on file  Food Insecurity: Not on file  Transportation Needs: Not on file  Physical Activity: Not on file  Stress: Not on file  Social Connections: Not on file  Intimate Partner Violence: Not on file    FAMILY HISTORY: Family History   Problem Relation Age of Onset   Hyperlipidemia Mother    Hypertension Mother    Depression Mother    Hyperlipidemia Father    Hypertension Father    Mental illness Father    Hypertension Brother    Breast cancer Maternal Aunt    Breast cancer Maternal Aunt    Cancer Maternal Uncle    Breast cancer Paternal Aunt    Hearing loss Paternal Aunt    Cancer Paternal Uncle    Hearing loss Paternal Uncle    Diabetes Paternal Uncle    Diabetes Paternal Grandfather     ALLERGIES:  is allergic to eggs or egg-derived products.  MEDICATIONS:  Current Outpatient Medications  Medication Sig Dispense Refill   albuterol (PROVENTIL) (2.5 MG/3ML) 0.083% nebulizer solution Take 3 mLs (2.5 mg total) by nebulization every 6 (six) hours as needed for wheezing or shortness of breath. 150 mL 6   albuterol (VENTOLIN HFA) 108 (90 Base) MCG/ACT inhaler INHALE 2 PUFFS INTO THE LUNGS EVERY 6 HOURS AS NEEDED FOR WHEEZING OR SHORTNESS OF BREATH 18 g 1   amphetamine-dextroamphetamine (ADDERALL XR) 15 MG 24 hr capsule Take by mouth every morning.     amphetamine-dextroamphetamine (ADDERALL) 5 MG tablet Take 1 tablet by mouth daily.     baclofen (LIORESAL) 10 MG tablet TAKE 1/2 TO 1 TABLET(5 TO 10 MG) BY MOUTH THREE TIMES DAILY 90 tablet 1   benzonatate (TESSALON) 200 MG capsule Take 1 capsule (200 mg total) by mouth 2 (two) times daily as needed for cough. 20 capsule 0   cyclobenzaprine (FLEXERIL) 10 MG tablet Take 1 tablet (10 mg total) by mouth at bedtime. 30 tablet 2   diphenhydramine-acetaminophen (TYLENOL PM) 25-500 MG TABS tablet Take 1 tablet by mouth at bedtime as needed.     fenofibrate (TRICOR) 145 MG tablet Take 1 tablet (145 mg total) by mouth daily. 30 tablet 3   Fluticasone-Umeclidin-Vilant (TRELEGY ELLIPTA) 100-62.5-25 MCG/INH AEPB Inhale 1 puff into the lungs daily. 1 each 11   gabapentin (NEURONTIN) 100 MG capsule Take 1 capsule (100 mg total) by mouth at bedtime. 90 capsule 3   lamoTRIgine  (LAMICTAL) 200 MG tablet TAKE 1 TABLET(200 MG) BY MOUTH DAILY 90 tablet 0   lamoTRIgine (LAMICTAL) 25 MG tablet TAKE 2 TABLETS BY MOUTH EVERY DAY. TO BE COMBINED WITH 200MG  EVERY DAY AT BEDTIME 180 tablet 0   LATUDA 20 MG TABS tablet Take 40 mg by mouth daily.     lidocaine (XYLOCAINE) 2 % solution SMARTSIG:Milliliter(s) Topical Daily PRN     LORazepam (ATIVAN) 0.5 MG tablet Take 1 tablet (0.5 mg total) by mouth as directed. Take 1 tablet 1-2 times a day as needed for severe anxiety attacks only - please limit use 21 tablet 0   MELATONIN PO Take by mouth.     omeprazole (PRILOSEC) 40 MG capsule TAKE 1 CAPSULE(40 MG) BY MOUTH DAILY 90 capsule 1   traZODone (DESYREL) 50 MG tablet Take 2 tablets (100  mg total) by mouth at bedtime. 60 tablet 0   predniSONE (DELTASONE) 50 MG tablet Take 1 tablet (50 mg total) by mouth daily with breakfast. (Patient not taking: Reported on 02/20/2021) 5 tablet 0   No current facility-administered medications for this visit.     PHYSICAL EXAMINATION: ECOG PERFORMANCE STATUS: 0 - Asymptomatic Vitals:   02/20/21 1511  BP: (!) 135/94  Pulse: 98  Resp: 18  Temp: (!) 97.4 F (36.3 C)   Filed Weights   02/20/21 1511  Weight: 240 lb 8 oz (109.1 kg)    Physical Exam Constitutional:      General: She is not in acute distress.    Appearance: She is obese.  HENT:     Head: Normocephalic and atraumatic.  Eyes:     General: No scleral icterus. Cardiovascular:     Rate and Rhythm: Normal rate and regular rhythm.     Heart sounds: Normal heart sounds.  Pulmonary:     Effort: Pulmonary effort is normal. No respiratory distress.     Breath sounds: No wheezing.  Abdominal:     General: Bowel sounds are normal. There is no distension.     Palpations: Abdomen is soft.  Musculoskeletal:        General: No deformity. Normal range of motion.     Cervical back: Normal range of motion and neck supple.  Skin:    General: Skin is warm and dry.     Findings: No  erythema or rash.  Neurological:     Mental Status: She is alert and oriented to person, place, and time. Mental status is at baseline.     Cranial Nerves: No cranial nerve deficit.     Coordination: Coordination normal.  Psychiatric:        Mood and Affect: Mood normal.     LABORATORY DATA:  I have reviewed the data as listed Lab Results  Component Value Date   WBC 14.3 (H) 02/07/2021   HGB 11.2 02/07/2021   HCT 35.2 02/07/2021   MCV 75 (L) 02/07/2021   PLT 358 02/07/2021   Recent Labs    03/26/20 1542 07/09/20 1826 02/07/21 0845  NA 138 137 137  K 4.6 3.6 4.7  CL 101 104 99  CO2 20 25 24   GLUCOSE 88 108* 82  BUN 16 17 11   CREATININE 0.67 0.88 0.64  CALCIUM 9.5 8.7* 8.7  GFRNONAA 121 >60  --   GFRAA 139  --   --   PROT 7.2  --  6.1  ALBUMIN 4.5  --  3.7*  AST 18  --  19  ALT 23  --  18  ALKPHOS 89  --  103  BILITOT <0.2  --  <0.2   Iron/TIBC/Ferritin/ %Sat    Component Value Date/Time   IRON 18 (L) 02/07/2021 0845   TIBC 309 02/07/2021 0845   FERRITIN 5 (L) 02/20/2021 1558   FERRITIN 9 (L) 02/07/2021 0845   IRONPCTSAT 6 (LL) 02/07/2021 0845     RADIOGRAPHIC STUDIES: I have personally reviewed the radiological images as listed and agreed with the findings in the report. No results found.     ASSESSMENT & PLAN:  1. Iron deficiency   2. Menorrhagia with regular cycle    #Iron deficiency anemia, Labs are reviewed and discussed with patient. Consistent with iron deficiency anemia. Patient is not able to tolerate oral iron supplementation.  We discussed about option of IV Venofer treatments. Plan IV iron with Venofer 200mg  weekly  x 3 doses. Allergy reactions/infusion reaction including anaphylactic reaction were discussed with patient. Other side effects include but not limited to high blood pressure, headache, skin rash, weight gain, leg swelling, etc. Patient voices understanding and willing to proceed.   # Menorrhagia, recommend patient to  re-establish care with gyn.  Orders Placed This Encounter  Procedures   Von Willebrand panel    Standing Status:   Future    Number of Occurrences:   1    Standing Expiration Date:   02/20/2022   CBC with Differential/Platelet    Standing Status:   Future    Standing Expiration Date:   02/20/2022   Ferritin    Standing Status:   Future    Standing Expiration Date:   02/20/2022   Pregnancy, urine    Standing Status:   Standing    Number of Occurrences:   3    Standing Expiration Date:   02/20/2022    All questions were answered. The patient knows to call the clinic with any problems questions or concerns.  Cc Johnson, Megan P, DO  Return of visit: 3 months. Thank you for this kind referral and the opportunity to participate in the care of this patient. A copy of today's note is routed to referring provider   Earlie Server, MD, PhD  02/20/2021

## 2021-02-21 ENCOUNTER — Other Ambulatory Visit: Payer: Self-pay | Admitting: *Deleted

## 2021-02-21 DIAGNOSIS — E611 Iron deficiency: Secondary | ICD-10-CM

## 2021-02-21 LAB — VON WILLEBRAND PANEL
Coagulation Factor VIII: 113 % (ref 56–140)
Ristocetin Co-factor, Plasma: 68 % (ref 50–200)
Von Willebrand Antigen, Plasma: 96 % (ref 50–200)

## 2021-02-21 LAB — COAG STUDIES INTERP REPORT

## 2021-02-27 ENCOUNTER — Inpatient Hospital Stay: Payer: BC Managed Care – PPO

## 2021-02-27 ENCOUNTER — Telehealth: Payer: Self-pay

## 2021-02-27 ENCOUNTER — Other Ambulatory Visit: Payer: Self-pay

## 2021-02-27 VITALS — BP 128/97 | HR 93 | Temp 97.5°F | Resp 18

## 2021-02-27 DIAGNOSIS — E611 Iron deficiency: Secondary | ICD-10-CM

## 2021-02-27 DIAGNOSIS — D509 Iron deficiency anemia, unspecified: Secondary | ICD-10-CM | POA: Diagnosis not present

## 2021-02-27 MED ORDER — IRON SUCROSE 20 MG/ML IV SOLN
200.0000 mg | Freq: Once | INTRAVENOUS | Status: AC
  Administered 2021-02-27: 200 mg via INTRAVENOUS
  Filled 2021-02-27: qty 10

## 2021-02-27 MED ORDER — SODIUM CHLORIDE 0.9 % IV SOLN
Freq: Once | INTRAVENOUS | Status: AC
  Filled 2021-02-27: qty 250

## 2021-02-27 MED ORDER — SODIUM CHLORIDE 0.9 % IV SOLN: 200.0000 mg | Freq: Once | INTRAVENOUS | Status: DC

## 2021-02-27 NOTE — Patient Instructions (Signed)

## 2021-02-27 NOTE — Telephone Encounter (Signed)
Per secure from [facility]. in lab: Patient stated " it's no way in hell she could be pregnant and she refused to give [place] a urine sample because she was told she could refuse."  Future lab for urine preg test cancelled per pt request. MD aware.

## 2021-02-27 NOTE — Progress Notes (Signed)
Per Zeb Comfort  NT pt reported " it's no way in hell she could be pregnant and she refused to give [place] a urine sample because she was told she could refuse".   Pt reports there is no way she could be pregnant due to the fact that she is not sexually active with a female partner. Pt aware of dangers of receiving Venofer during the first trimester of pregnancy and to please inform clinic if circumstances were to change. Pt aware and verbalizes understanding, pt refuses urine pregnancy today and every appt moving forward. MD and MD team aware. Per Benjamine Mola RN per Dr. Tasia Catchings okay to proceed with Venofer today and moving forward without a urine pregnancy test.

## 2021-03-06 ENCOUNTER — Inpatient Hospital Stay: Payer: BC Managed Care – PPO

## 2021-03-06 ENCOUNTER — Other Ambulatory Visit: Payer: Self-pay

## 2021-03-06 VITALS — BP 133/76 | HR 89 | Temp 97.2°F | Resp 20

## 2021-03-06 DIAGNOSIS — E611 Iron deficiency: Secondary | ICD-10-CM

## 2021-03-06 DIAGNOSIS — D509 Iron deficiency anemia, unspecified: Secondary | ICD-10-CM | POA: Diagnosis not present

## 2021-03-06 MED ORDER — SODIUM CHLORIDE 0.9 % IV SOLN
Freq: Once | INTRAVENOUS | Status: AC
  Filled 2021-03-06: qty 250

## 2021-03-06 MED ORDER — IRON SUCROSE 20 MG/ML IV SOLN
200.0000 mg | Freq: Once | INTRAVENOUS | Status: AC
  Administered 2021-03-06: 16:00:00 200 mg via INTRAVENOUS
  Filled 2021-03-06: qty 10

## 2021-03-06 MED ORDER — SODIUM CHLORIDE 0.9 % IV SOLN: 200.0000 mg | Freq: Once | INTRAVENOUS | Status: DC

## 2021-03-07 ENCOUNTER — Other Ambulatory Visit: Payer: Self-pay | Admitting: Family Medicine

## 2021-03-07 DIAGNOSIS — F3162 Bipolar disorder, current episode mixed, moderate: Secondary | ICD-10-CM

## 2021-03-07 NOTE — Telephone Encounter (Signed)
Requested Prescriptions  Pending Prescriptions Disp Refills   traZODone (DESYREL) 50 MG tablet [Pharmacy Med Name: TRAZODONE 50MG  TABLETS] 60 tablet 5    Sig: TAKE 2 TABLETS(100 MG) BY MOUTH AT BEDTIME     Psychiatry: Antidepressants - Serotonin Modulator Passed - 03/07/2021  3:21 AM      Passed - Completed PHQ-2 or PHQ-9 in the last 360 days      Passed - Valid encounter within last 6 months    Recent Outpatient Visits          4 weeks ago Routine general medical examination at a health care facility   [facility], Megan P, DO   1 month ago COVID-19   [facility], Megan P, DO   4 months ago Upper respiratory tract infection, unspecified type   [facility], Megan P, DO   7 months ago Chronic cough   [facility], [place], DO   8 months ago Breast lump on left side at 3 o'clock position   [facility] [place], [place], DO      Future Appointments            In 5 months [facility], Laural Benes, DO Oralia Rud, [facility]

## 2021-03-08 ENCOUNTER — Encounter: Payer: Self-pay | Admitting: Family Medicine

## 2021-03-13 ENCOUNTER — Other Ambulatory Visit: Payer: Self-pay

## 2021-03-13 ENCOUNTER — Inpatient Hospital Stay: Payer: BC Managed Care – PPO

## 2021-03-13 ENCOUNTER — Inpatient Hospital Stay: Payer: BC Managed Care – PPO | Attending: Oncology

## 2021-03-13 VITALS — BP 139/83 | HR 97 | Temp 98.0°F | Resp 20

## 2021-03-13 DIAGNOSIS — E611 Iron deficiency: Secondary | ICD-10-CM

## 2021-03-13 DIAGNOSIS — D509 Iron deficiency anemia, unspecified: Secondary | ICD-10-CM | POA: Insufficient documentation

## 2021-03-13 MED ORDER — SODIUM CHLORIDE 0.9 % IV SOLN: 200.0000 mg | Freq: Once | INTRAVENOUS | Status: DC

## 2021-03-13 MED ORDER — IRON SUCROSE 20 MG/ML IV SOLN
200.0000 mg | Freq: Once | INTRAVENOUS | Status: AC
  Administered 2021-03-13: 200 mg via INTRAVENOUS
  Filled 2021-03-13: qty 10

## 2021-03-13 MED ORDER — SODIUM CHLORIDE 0.9 % IV SOLN
Freq: Once | INTRAVENOUS | Status: AC
  Filled 2021-03-13: qty 250

## 2021-03-13 NOTE — Patient Instructions (Signed)

## 2021-03-15 ENCOUNTER — Other Ambulatory Visit: Payer: Self-pay | Admitting: Family Medicine

## 2021-03-15 DIAGNOSIS — E781 Pure hyperglyceridemia: Secondary | ICD-10-CM

## 2021-03-22 ENCOUNTER — Ambulatory Visit (INDEPENDENT_AMBULATORY_CARE_PROVIDER_SITE_OTHER): Payer: BC Managed Care – PPO | Admitting: Family Medicine

## 2021-03-22 ENCOUNTER — Encounter: Payer: Self-pay | Admitting: Family Medicine

## 2021-03-22 ENCOUNTER — Other Ambulatory Visit: Payer: Self-pay

## 2021-03-22 VITALS — BP 136/92 | HR 90 | Temp 98.5°F

## 2021-03-22 DIAGNOSIS — F419 Anxiety disorder, unspecified: Secondary | ICD-10-CM

## 2021-03-22 DIAGNOSIS — R112 Nausea with vomiting, unspecified: Secondary | ICD-10-CM | POA: Diagnosis not present

## 2021-03-22 DIAGNOSIS — E781 Pure hyperglyceridemia: Secondary | ICD-10-CM | POA: Diagnosis not present

## 2021-03-22 LAB — URINALYSIS, ROUTINE W REFLEX MICROSCOPIC
Bilirubin, UA: NEGATIVE
Glucose, UA: NEGATIVE
Ketones, UA: NEGATIVE
Nitrite, UA: NEGATIVE
Specific Gravity, UA: 1.015 (ref 1.005–1.030)
Urobilinogen, Ur: 0.2 mg/dL (ref 0.2–1.0)
pH, UA: 5.5 (ref 5.0–7.5)

## 2021-03-22 LAB — MICROSCOPIC EXAMINATION
Bacteria, UA: NONE SEEN
RBC, Urine: >30 /hpf — ABNORMAL HIGH (ref 0–2)

## 2021-03-22 MED ORDER — SUCRALFATE 1 G PO TABS: 1.0000 g | ORAL_TABLET | Freq: Three times a day (TID) | ORAL | 2 refills | Status: DC

## 2021-03-22 MED ORDER — ONDANSETRON HCL 4 MG PO TABS: 4.0000 mg | ORAL_TABLET | Freq: Three times a day (TID) | ORAL | 3 refills | Status: DC | PRN

## 2021-03-22 MED ORDER — OMEPRAZOLE 40 MG PO CPDR: DELAYED_RELEASE_CAPSULE | ORAL | 0 refills | Status: DC

## 2021-03-22 MED ORDER — PROMETHAZINE HCL 25 MG RE SUPP: 25.0000 mg | Freq: Four times a day (QID) | RECTAL | 2 refills | Status: DC | PRN

## 2021-03-22 NOTE — Assessment & Plan Note (Signed)
Rechecking labs today. Await results. Treat as needed.  °

## 2021-03-22 NOTE — Progress Notes (Signed)
BP (!) 136/92    Pulse 90    Temp 98.5 F (36.9 C)    SpO2 98%    Subjective:    Patient ID: Mariah Gonzalez, female    DOB: 14-Mar-1990, 31 y.o.   MRN: [id number]  HPI: Mariah Gonzalez is a 31 y.o. female  Chief Complaint  Patient presents with   Nausea    Patient has been experiencing nausea for a month, also has been vomiting at least twice a day.    Anxiety    Patient states her anxiety has been worse due to iron infusions    ABDOMINAL ISSUES Duration: about a month Nature: nausea Location: diffuse  Severity: severe  Radiation: no Frequency: constant Alleviating factors: emitrol, smoking MJ Aggravating factors: eating and drinking Treatments attempted: emitrol Constipation: no Diarrhea: yes Mucous in the stool: yes Heartburn: yes Bloating:yes Flatulence: yes Nausea: yes Vomiting: yes Melena or hematochezia: no Rash: no Jaundice: no Fever: no Weight loss: yes   She has been very worried about her triglycerides. She is very worried. Feels like she is constantly flooded with worries. Seeing her psychiatrist in about 2 weeks.  Relevant past medical, surgical, family and social history reviewed and updated as indicated. Interim medical history since our last visit reviewed. Allergies and medications reviewed and updated.  Review of Systems  Constitutional: Negative.   HENT: Negative.    Respiratory: Negative.    Cardiovascular: Negative.   Gastrointestinal:  Positive for nausea and vomiting. Negative for abdominal distention, abdominal pain, anal bleeding, blood in stool, constipation, diarrhea and rectal pain.  Musculoskeletal: Negative.   Neurological: Negative.   Psychiatric/Behavioral: Negative.     Per HPI unless specifically indicated above     Objective:    BP (!) 136/92    Pulse 90    Temp 98.5 F (36.9 C)    SpO2 98%   Wt Readings from Last 3 Encounters:  02/20/21 240 lb 8 oz (109.1 kg)  02/07/21 245 lb (111.1 kg)   08/03/20 246 lb (111.6 kg)    Physical Exam Vitals and nursing note reviewed.  Constitutional:      General: She is not in acute distress.    Appearance: Normal appearance. She is not ill-appearing, toxic-appearing or diaphoretic.  HENT:     Head: Normocephalic and atraumatic.     Right Ear: External ear normal.     Left Ear: External ear normal.     Nose: Nose normal.     Mouth/Throat:     Mouth: Mucous membranes are moist.     Pharynx: Oropharynx is clear.  Eyes:     General: No scleral icterus.       Right eye: No discharge.        Left eye: No discharge.     Extraocular Movements: Extraocular movements intact.     Conjunctiva/sclera: Conjunctivae normal.     Pupils: Pupils are equal, round, and reactive to light.  Cardiovascular:     Rate and Rhythm: Normal rate and regular rhythm.     Pulses: Normal pulses.     Heart sounds: Normal heart sounds. No murmur heard.   No friction rub. No gallop.  Pulmonary:     Effort: Pulmonary effort is normal. No respiratory distress.     Breath sounds: Normal breath sounds. No stridor. No wheezing, rhonchi or rales.  Chest:     Chest wall: No tenderness.  Abdominal:     General: Abdomen is flat. Bowel sounds are normal. There  is no distension.     Palpations: Abdomen is soft. There is no mass.     Tenderness: There is no abdominal tenderness. There is no right CVA tenderness, left CVA tenderness, guarding or rebound.     Hernia: No hernia is present.  Musculoskeletal:        General: Normal range of motion.     Cervical back: Normal range of motion and neck supple.  Skin:    General: Skin is warm and dry.     Capillary Refill: Capillary refill takes less than 2 seconds.     Coloration: Skin is not jaundiced or pale.     Findings: No bruising, erythema, lesion or rash.  Neurological:     General: No focal deficit present.     Mental Status: She is alert and oriented to person, place, and time. Mental status is at baseline.   Psychiatric:        Mood and Affect: Mood normal.        Behavior: Behavior normal.        Thought Content: Thought content normal.        Judgment: Judgment normal.    Results for orders placed or performed in visit on 02/20/21  Von Willebrand panel  Result Value Ref Range   Coagulation Factor VIII 113 56 - 140 %   Ristocetin Co-factor, Plasma 68 50 - 200 %   Von Willebrand Antigen, Plasma 96 50 - 200 %  Coag Studies Interp Report  Result Value Ref Range   Interpretation Note       Assessment & Plan:   Problem List Items Addressed This Visit       Other   Hypertriglyceridemia    Rechecking labs today. Await results. Treat as needed.       Relevant Orders   Lipid Panel w/o Chol/HDL Ratio   Other Visit Diagnoses     Nausea and vomiting, unspecified vomiting type    -  Primary   In exacerbation- will start zofran and phenergan. Increase her omeprazole to BID and start carafate. Recheck 2 weeks. Call with any concerns.   Relevant Orders   Comprehensive metabolic panel   CBC with Differential/Platelet   TSH   Urinalysis, Routine w reflex microscopic   Anxiety       Not doing well. Following with psychiatry. Will recheck with them in 2 weeks. Call with any concerns.         Follow up plan: Return in about 2 weeks (around 04/05/2021).

## 2021-03-23 LAB — CBC WITH DIFFERENTIAL/PLATELET
Basophils Absolute: 0.1 10*3/uL (ref 0.0–0.2)
Basos: 1 %
EOS (ABSOLUTE): 0 10*3/uL (ref 0.0–0.4)
Eos: 0 %
Hematocrit: 42.3 % (ref 34.0–46.6)
Hemoglobin: 13.5 g/dL (ref 11.1–15.9)
Immature Grans (Abs): 0 10*3/uL (ref 0.0–0.1)
Immature Granulocytes: 0 %
Lymphocytes Absolute: 2.1 10*3/uL (ref 0.7–3.1)
Lymphs: 24 %
MCH: 25.3 pg — ABNORMAL LOW (ref 26.6–33.0)
MCHC: 31.9 g/dL (ref 31.5–35.7)
MCV: 79 fL (ref 79–97)
Monocytes Absolute: 0.5 10*3/uL (ref 0.1–0.9)
Monocytes: 6 %
Neutrophils Absolute: 5.9 10*3/uL (ref 1.4–7.0)
Neutrophils: 69 %
Platelets: 356 10*3/uL (ref 150–450)
RBC: 5.33 x10E6/uL — ABNORMAL HIGH (ref 3.77–5.28)
RDW: 20.1 % — ABNORMAL HIGH (ref 11.7–15.4)
WBC: 8.7 10*3/uL (ref 3.4–10.8)

## 2021-03-23 LAB — COMPREHENSIVE METABOLIC PANEL
ALT: 14 IU/L (ref 0–32)
AST: 15 IU/L (ref 0–40)
Albumin/Globulin Ratio: 2.4 — ABNORMAL HIGH (ref 1.2–2.2)
Albumin: 4.7 g/dL (ref 3.9–5.0)
Alkaline Phosphatase: 65 IU/L (ref 44–121)
BUN/Creatinine Ratio: 11 (ref 9–23)
BUN: 11 mg/dL (ref 6–20)
Bilirubin Total: <0.2 mg/dL (ref 0.0–1.2)
CO2: 21 mmol/L (ref 20–29)
Calcium: 9.2 mg/dL (ref 8.7–10.2)
Chloride: 102 mmol/L (ref 96–106)
Creatinine, Ser: 1.01 mg/dL — ABNORMAL HIGH (ref 0.57–1.00)
Globulin, Total: 2 g/dL (ref 1.5–4.5)
Glucose: 94 mg/dL (ref 70–99)
Potassium: 4.4 mmol/L (ref 3.5–5.2)
Sodium: 138 mmol/L (ref 134–144)
Total Protein: 6.7 g/dL (ref 6.0–8.5)
eGFR: 78 mL/min/{1.73_m2} (ref >59)

## 2021-03-23 LAB — TSH: TSH: 0.74 u[IU]/mL (ref 0.450–4.500)

## 2021-03-23 LAB — LIPID PANEL W/O CHOL/HDL RATIO
Cholesterol, Total: 177 mg/dL (ref 100–199)
HDL: 46 mg/dL (ref >39)
LDL Chol Calc (NIH): 113 mg/dL — ABNORMAL HIGH (ref 0–99)
Triglycerides: 98 mg/dL (ref 0–149)
VLDL Cholesterol Cal: 18 mg/dL (ref 5–40)

## 2021-04-05 ENCOUNTER — Telehealth (INDEPENDENT_AMBULATORY_CARE_PROVIDER_SITE_OTHER): Payer: BC Managed Care – PPO | Admitting: Family Medicine

## 2021-04-05 ENCOUNTER — Encounter: Payer: Self-pay | Admitting: Family Medicine

## 2021-04-05 DIAGNOSIS — R112 Nausea with vomiting, unspecified: Secondary | ICD-10-CM

## 2021-04-05 DIAGNOSIS — F3162 Bipolar disorder, current episode mixed, moderate: Secondary | ICD-10-CM | POA: Diagnosis not present

## 2021-04-05 DIAGNOSIS — E781 Pure hyperglyceridemia: Secondary | ICD-10-CM

## 2021-04-05 NOTE — Assessment & Plan Note (Signed)
Significantly better on last check. Continue current regimen. Call with any concerns.

## 2021-04-05 NOTE — Assessment & Plan Note (Signed)
Still not doing great. Seeing psychiatry next week for med adjustment.

## 2021-04-05 NOTE — Progress Notes (Signed)
There were no vitals taken for this visit.   Subjective:    Patient ID: Mariah Gonzalez, female    DOB: 08-21-1990, 31 y.o.   MRN: [id number]  HPI: Mariah Gonzalez is a 31 y.o. female  Chief Complaint  Patient presents with   Nausea    Patient states it is 80% better, but still there.    Anxiety   Feeling about 80% better. Tries to avoid the phenergan. Has needed the zofran the past 3 days in the AM. She has not been vomiting as much. She remains nauseous however. Her mood is still not good. Still very anxious. Does not feel like the latuda is not working as well as abilify was. She has not seen her psychiatrist yet, but is going to be seeing them next week. No other concerns or complaints at this time.   Relevant past medical, surgical, family and social history reviewed and updated as indicated. Interim medical history since our last visit reviewed. Allergies and medications reviewed and updated.  Review of Systems  Constitutional: Negative.   Respiratory: Negative.    Cardiovascular: Negative.   Gastrointestinal:  Positive for abdominal pain and nausea. Negative for abdominal distention, anal bleeding, blood in stool, constipation, diarrhea, rectal pain and vomiting.  Musculoskeletal: Negative.   Neurological: Negative.   Psychiatric/Behavioral:  Positive for dysphoric mood. Negative for agitation, behavioral problems, confusion, decreased concentration, hallucinations, self-injury, sleep disturbance and suicidal ideas. The patient is nervous/anxious. The patient is not hyperactive.    Per HPI unless specifically indicated above     Objective:    There were no vitals taken for this visit.  Wt Readings from Last 3 Encounters:  02/20/21 240 lb 8 oz (109.1 kg)  02/07/21 245 lb (111.1 kg)  08/03/20 246 lb (111.6 kg)    Physical Exam Vitals and nursing note reviewed.  Constitutional:      General: She is not in acute distress.    Appearance: Normal  appearance. She is not ill-appearing, toxic-appearing or diaphoretic.  HENT:     Head: Normocephalic and atraumatic.     Right Ear: External ear normal.     Left Ear: External ear normal.     Nose: Nose normal.     Mouth/Throat:     Mouth: Mucous membranes are moist.     Pharynx: Oropharynx is clear.  Eyes:     General: No scleral icterus.       Right eye: No discharge.        Left eye: No discharge.     Extraocular Movements: Extraocular movements intact.     Conjunctiva/sclera: Conjunctivae normal.     Pupils: Pupils are equal, round, and reactive to light.  Cardiovascular:     Rate and Rhythm: Normal rate and regular rhythm.     Pulses: Normal pulses.     Heart sounds: Normal heart sounds. No murmur heard.   No friction rub. No gallop.  Pulmonary:     Effort: Pulmonary effort is normal. No respiratory distress.     Breath sounds: Normal breath sounds. No stridor. No wheezing, rhonchi or rales.  Chest:     Chest wall: No tenderness.  Musculoskeletal:        General: Normal range of motion.     Cervical back: Normal range of motion and neck supple.  Skin:    General: Skin is warm and dry.     Capillary Refill: Capillary refill takes less than 2 seconds.     Coloration:  Skin is not jaundiced or pale.     Findings: No bruising, erythema, lesion or rash.  Neurological:     General: No focal deficit present.     Mental Status: She is alert and oriented to person, place, and time. Mental status is at baseline.  Psychiatric:        Mood and Affect: Mood normal.        Behavior: Behavior normal.        Thought Content: Thought content normal.        Judgment: Judgment normal.    Results for orders placed or performed in visit on 03/22/21  Microscopic Examination   Urine  Result Value Ref Range   WBC, UA 0-5 0 - 5 /hpf   RBC >30 (H) 0 - 2 /hpf   Epithelial Cells (non renal) 0-10 0 - 10 /hpf   Mucus, UA Present (A) Not Estab.   Bacteria, UA None seen None seen/Few   Comprehensive metabolic panel  Result Value Ref Range   Glucose 94 70 - 99 mg/dL   BUN 11 6 - 20 mg/dL   Creatinine, Ser 1.01 (H) 0.57 - 1.00 mg/dL   eGFR 78 >59 mL/min/1.73   BUN/Creatinine Ratio 11 9 - 23   Sodium 138 134 - 144 mmol/L   Potassium 4.4 3.5 - 5.2 mmol/L   Chloride 102 96 - 106 mmol/L   CO2 21 20 - 29 mmol/L   Calcium 9.2 8.7 - 10.2 mg/dL   Total Protein 6.7 6.0 - 8.5 g/dL   Albumin 4.7 3.9 - 5.0 g/dL   Globulin, Total 2.0 1.5 - 4.5 g/dL   Albumin/Globulin Ratio 2.4 (H) 1.2 - 2.2   Bilirubin Total <0.2 0.0 - 1.2 mg/dL   Alkaline Phosphatase 65 44 - 121 IU/L   AST 15 0 - 40 IU/L   ALT 14 0 - 32 IU/L  CBC with Differential/Platelet  Result Value Ref Range   WBC 8.7 3.4 - 10.8 x10E3/uL   RBC 5.33 (H) 3.77 - 5.28 x10E6/uL   Hemoglobin 13.5 11.1 - 15.9 g/dL   Hematocrit 42.3 34.0 - 46.6 %   MCV 79 79 - 97 fL   MCH 25.3 (L) 26.6 - 33.0 pg   MCHC 31.9 31.5 - 35.7 g/dL   RDW 20.1 (H) 11.7 - 15.4 %   Platelets 356 150 - 450 x10E3/uL   Neutrophils 69 Not Estab. %   Lymphs 24 Not Estab. %   Monocytes 6 Not Estab. %   Eos 0 Not Estab. %   Basos 1 Not Estab. %   Neutrophils Absolute 5.9 1.4 - 7.0 x10E3/uL   Lymphocytes Absolute 2.1 0.7 - 3.1 x10E3/uL   Monocytes Absolute 0.5 0.1 - 0.9 x10E3/uL   EOS (ABSOLUTE) 0.0 0.0 - 0.4 x10E3/uL   Basophils Absolute 0.1 0.0 - 0.2 x10E3/uL   Immature Granulocytes 0 Not Estab. %   Immature Grans (Abs) 0.0 0.0 - 0.1 x10E3/uL  Lipid Panel w/o Chol/HDL Ratio  Result Value Ref Range   Cholesterol, Total 177 100 - 199 mg/dL   Triglycerides 98 0 - 149 mg/dL   HDL 46 >39 mg/dL   VLDL Cholesterol Cal 18 5 - 40 mg/dL   LDL Chol Calc (NIH) 113 (H) 0 - 99 mg/dL  TSH  Result Value Ref Range   TSH 0.740 0.450 - 4.500 uIU/mL  Urinalysis, Routine w reflex microscopic  Result Value Ref Range   Specific Gravity, UA 1.015 1.005 - 1.030   pH, UA  5.5 5.0 - 7.5   Color, UA Red (A) Yellow   Appearance Ur Cloudy (A) Clear   Leukocytes,UA  Trace (A) Negative   Protein,UA 2+ (A) Negative/Trace   Glucose, UA Negative Negative   Ketones, UA Negative Negative   RBC, UA 3+ (A) Negative   Bilirubin, UA Negative Negative   Urobilinogen, Ur 0.2 0.2 - 1.0 mg/dL   Nitrite, UA Negative Negative   Microscopic Examination See below:       Assessment & Plan:   Problem List Items Addressed This Visit       Other   Bipolar 1 disorder, mixed, moderate (HCC)    Still not doing great. Seeing psychiatry next week for med adjustment.       Hypertriglyceridemia    Significantly better on last check. Continue current regimen. Call with any concerns.       Other Visit Diagnoses     Nausea and vomiting, unspecified vomiting type    -  Primary   Singificantly improved. Continue carafate for another couple of weeks, then cut down to PRN. If continues with significant nausea, will refer to GI for EGD.        Follow up plan: No follow-ups on file.   This visit was completed via video visit through MyChart due to the restrictions of the COVID-19 pandemic. All issues as above were discussed and addressed. Physical exam was done as above through visual confirmation on video through MyChart. If it was felt that the patient should be evaluated in the office, they were directed there. The patient verbally consented to this visit. Location of the patient: work Location of the provider: work Those involved with this call:  Provider: Park Liter, DO CMA: Louanna Raw, [place] Desk/Registration: [employer]  Time spent on call:  15 minutes with patient face to face via video conference. More than 50% of this time was spent in counseling and coordination of care. 23 minutes total spent in review of patient's record and preparation of their chart.

## 2021-04-08 ENCOUNTER — Other Ambulatory Visit: Payer: Self-pay | Admitting: Family Medicine

## 2021-04-09 NOTE — Telephone Encounter (Signed)
Requested Prescriptions  Pending Prescriptions Disp Refills   albuterol (VENTOLIN HFA) 108 (90 Base) MCG/ACT inhaler [Pharmacy Med Name: ALBUTEROL HFA INH(200 PUFFS)18GM] 18 g 3    Sig: INHALE 2 PUFFS INTO THE LUNGS EVERY 6 HOURS AS NEEDED FOR WHEEZING OR SHORTNESS OF BREATH     Pulmonology:  Beta Agonists 2 Failed - 04/08/2021  7:11 AM      Failed - Last BP in normal range    BP Readings from Last 1 Encounters:  03/22/21 (!) 136/92         Passed - Last Heart Rate in normal range    Pulse Readings from Last 1 Encounters:  03/22/21 90         Passed - Valid encounter within last 12 months    Recent Outpatient Visits          4 days ago Nausea and vomiting, unspecified vomiting type   [place], Megan P, DO   2 weeks ago Nausea and vomiting, unspecified vomiting type   [place], Megan P, DO   2 months ago Routine general medical examination at a health care facility   [facility], [place], [place]   2 months ago [place], Megan P, DO   5 months ago Upper respiratory tract infection, unspecified type   [facility] [place], [place], DO      Future Appointments            In 4 months Johnson, Megan P, DO [place]            baclofen (LIORESAL) 10 MG tablet [employer] Med Name: BACLOFEN 10MG TABLETS] 90 tablet 3    Sig: TAKE 1/2 TO 1 TABLET(5 TO 10 MG) BY MOUTH THREE TIMES DAILY     Analgesics:  Muscle Relaxants - baclofen Failed - 04/08/2021  7:11 AM      Failed - Cr in normal range and within 180 days    Creatinine, Ser  Date Value Ref Range Status  03/22/2021 1.01 (H) 0.57 - 1.00 mg/dL Final         Passed - eGFR is 30 or above and within 180 days    GFR calc Af Amer  Date Value Ref Range Status  03/26/2020 139 >59 mL/min/1.73 Final    Comment:    **In accordance with recommendations from the NKF-ASN Task force,**   [facility] is in the  process of updating its eGFR calculation to the   2021 CKD-EPI creatinine equation that estimates kidney function   without a race variable.    GFR, Estimated  Date Value Ref Range Status  07/09/2020 >60 >60 mL/min Final    Comment:    (NOTE) Calculated using the CKD-EPI Creatinine Equation (2021)    eGFR  Date Value Ref Range Status  03/22/2021 78 >59 mL/min/1.73 Final         Passed - Valid encounter within last 6 months    Recent Outpatient Visits          4 days ago Nausea and vomiting, unspecified vomiting type   [place], DO   2 weeks ago Nausea and vomiting, unspecified vomiting type   [place], DO   2 months ago Routine general medical examination at a health care facility   [facility], [place] P, DO   2 months ago [facility], Megan P, DO   5 months ago Upper respiratory tract infection, unspecified type   [place], [place], DO      Future Appointments            In 4 months Wynetta Emery, Barb Merino, DO [employer], [facility]

## 2021-04-17 ENCOUNTER — Encounter: Payer: Self-pay | Admitting: Family Medicine

## 2021-04-17 DIAGNOSIS — R112 Nausea with vomiting, unspecified: Secondary | ICD-10-CM

## 2021-04-18 ENCOUNTER — Telehealth: Payer: Self-pay

## 2021-04-18 NOTE — Telephone Encounter (Signed)
CALLED PATIENT NO ANSWER LEFT VOICEMAIL FOR A CALL BACK ? ?

## 2021-04-19 ENCOUNTER — Telehealth: Payer: Self-pay

## 2021-04-19 NOTE — Telephone Encounter (Signed)
Scheduled for 07/18/2021 ?

## 2021-05-16 ENCOUNTER — Encounter: Payer: Self-pay | Admitting: Family Medicine

## 2021-05-20 ENCOUNTER — Encounter: Payer: BC Managed Care – PPO | Attending: Family Medicine | Admitting: Dietician

## 2021-05-20 ENCOUNTER — Ambulatory Visit (INDEPENDENT_AMBULATORY_CARE_PROVIDER_SITE_OTHER): Payer: BC Managed Care – PPO | Admitting: Family Medicine

## 2021-05-20 ENCOUNTER — Encounter: Payer: Self-pay | Admitting: Dietician

## 2021-05-20 ENCOUNTER — Encounter: Payer: Self-pay | Admitting: Family Medicine

## 2021-05-20 VITALS — BP 126/79 | HR 91 | Temp 98.8°F | Wt 235.0 lb

## 2021-05-20 VITALS — Ht 61.0 in | Wt 235.0 lb

## 2021-05-20 DIAGNOSIS — Z6841 Body Mass Index (BMI) 40.0 and over, adult: Secondary | ICD-10-CM | POA: Diagnosis not present

## 2021-05-20 DIAGNOSIS — E781 Pure hyperglyceridemia: Secondary | ICD-10-CM | POA: Diagnosis not present

## 2021-05-20 DIAGNOSIS — R58 Hemorrhage, not elsewhere classified: Secondary | ICD-10-CM

## 2021-05-20 DIAGNOSIS — E611 Iron deficiency: Secondary | ICD-10-CM | POA: Diagnosis not present

## 2021-05-20 MED ORDER — HYDROCORTISONE ACETATE 25 MG RE SUPP: 25.0000 mg | Freq: Two times a day (BID) | RECTAL | 3 refills | Status: DC

## 2021-05-20 NOTE — Patient Instructions (Signed)
Continue with healthy food choices and controlled portions, good job so far! ?Add some fruits, ideal intake is 2-4 servings daily.  ?Good rule of thumb is to choose foods with less than 10g added sugar or fat (especially saturated fat) ?Continue to build up physical activity/ exercise.  ?

## 2021-05-20 NOTE — Progress Notes (Signed)
? ?BP 126/79   Pulse 91   Temp 98.8 ?F (37.1 ?C)   Wt 235 lb (106.6 kg)   SpO2 98%   BMI 44.40 kg/m?   ? ?Subjective:  ? ? Patient ID: Mariah Gonzalez, female    DOB: 04-27-1990, 31 y.o.   MRN: [id number] ? ?HPI: ?Mariah Gonzalez is a 31 y.o. female ? ?Chief Complaint  ?Patient presents with  ? Rectal Bleeding  ?  Patient states she has noticed blood when she wipes after having a BM. Patient states this started about 2-3 months ago but is not every day.   ? ?RECTAL BLEEDING ?Duration: 2-3 months ?Bright red rectal bleeding: yes  ?Amount of blood: moderate  ?Frequency: 2-4 days a week ?Melena: no  ?Spotting on toilet tissue: yes  ?Anal fullness: yes  ?Perianal pain: yes  ?Severity: moderate to severe ?Perianal irritation/itching: yes  ?Constipation: yes  ?Chronic straining/valsava:  yes  ?Anal trauma/intercourse: no  ?Hemorrhoids: no  ?Previous colonoscopy: no  ? ?Relevant past medical, surgical, family and social history reviewed and updated as indicated. Interim medical history since our last visit reviewed. ?Allergies and medications reviewed and updated. ? ?Review of Systems  ?Constitutional: Negative.   ?Respiratory: Negative.    ?Cardiovascular: Negative.   ?Gastrointestinal:  Positive for blood in stool. Negative for abdominal distention, abdominal pain, anal bleeding, constipation, diarrhea, nausea, rectal pain and vomiting.  ?Musculoskeletal: Negative.   ?Psychiatric/Behavioral: Negative.    ? ?Per HPI unless specifically indicated above ? ?   ?Objective:  ?  ?BP 126/79   Pulse 91   Temp 98.8 ?F (37.1 ?C)   Wt 235 lb (106.6 kg)   SpO2 98%   BMI 44.40 kg/m?   ?Wt Readings from Last 3 Encounters:  ?05/20/21 235 lb (106.6 kg)  ?05/20/21 235 lb (106.6 kg)  ?02/20/21 240 lb 8 oz (109.1 kg)  ?  ?Physical Exam ?Vitals and nursing note reviewed.  ?Constitutional:   ?   General: She is not in acute distress. ?   Appearance: Normal appearance. She is obese. She is not ill-appearing,  toxic-appearing or diaphoretic.  ?HENT:  ?   Head: Normocephalic and atraumatic.  ?   Right Ear: External ear normal.  ?   Left Ear: External ear normal.  ?   Nose: Nose normal.  ?   Mouth/Throat:  ?   Mouth: Mucous membranes are moist.  ?   Pharynx: Oropharynx is clear.  ?Eyes:  ?   General: No scleral icterus.    ?   Right eye: No discharge.     ?   Left eye: No discharge.  ?   Extraocular Movements: Extraocular movements intact.  ?   Conjunctiva/sclera: Conjunctivae normal.  ?   Pupils: Pupils are equal, round, and reactive to light.  ?Cardiovascular:  ?   Rate and Rhythm: Normal rate and regular rhythm.  ?   Pulses: Normal pulses.  ?   Heart sounds: Normal heart sounds. No murmur heard. ?  No friction rub. No gallop.  ?Pulmonary:  ?   Effort: Pulmonary effort is normal. No respiratory distress.  ?   Breath sounds: Normal breath sounds. No stridor. No wheezing, rhonchi or rales.  ?Chest:  ?   Chest wall: No tenderness.  ?Genitourinary: ? ? ?Musculoskeletal:     ?   General: Normal range of motion.  ?   Cervical back: Normal range of motion and neck supple.  ?Skin: ?   General: Skin is warm and  dry.  ?   Capillary Refill: Capillary refill takes less than 2 seconds.  ?   Coloration: Skin is not jaundiced or pale.  ?   Findings: No bruising, erythema, lesion or rash.  ?Neurological:  ?   General: No focal deficit present.  ?   Mental Status: She is alert and oriented to person, place, and time. Mental status is at baseline.  ?Psychiatric:     ?   Mood and Affect: Mood normal.     ?   Behavior: Behavior normal.     ?   Thought Content: Thought content normal.     ?   Judgment: Judgment normal.  ? ? ?Results for orders placed or performed in visit on 05/16/21  ?HM PAP SMEAR  ?Result Value Ref Range  ? HM Pap smear scc result scanned into chart   ? ?   ?Assessment & Plan:  ? ?Problem List Items Addressed This Visit   ?None ?Visit Diagnoses   ? ? Internal hemorrhage    -  Primary  ? Will treat with hydrocortisone.  Maintain soft stools. Call with any concerns.   ? ?  ?  ? ?Follow up plan: ?Return if symptoms worsen or fail to improve. ? ? ? ? ? ?

## 2021-05-20 NOTE — Progress Notes (Signed)
Medical Nutrition Therapy: Visit start time: 1030  end time: 1130  ?Assessment:  Diagnosis: hypertriglyceridemia ?Past medical history: iron deficiency, GERD ?Psychosocial issues/ stress concerns: anxiety, depression, ADHD ? ?Preferred learning method:  ?Auditory/ discussion ?Visual ?Hands-on ? ? ?Current weight: 235lbs (pt reported) Height: 5'1" BMI: 44.4 ? ?Medications, supplements: reconciled list in medical record ? ?Progress and evaluation:  ?Patient reports improvement in TG from 1248 on 02/07/21 to 98  on 03/22/21 with fenofibrate and diet changes. She reports strong family history of hyperlipidemia. ?She has been reducing cholesterol, more heart healthy foods. Spouse has history of MI x2 and has diabetes so they are both working on healthy lifestyle habits.  ?Has been having iron deficiency, now improved with recent infusions. Has one more infusion next week.  ?Patient reports goal for weight at 180lbs; she was last at this weight about 2-3 years ago. ?She reports diarrhea often when eating eggs. ? ?Physical activity: no regular exercise; some outdoor work/ chores; took walk yesterday evening and plans to do more. Water walking in pool in summer. ? ?Dietary Intake:  ?Usual eating pattern includes 3 meals and 0 snacks per day. ?Dining out frequency: 4-5 meals per week. ? ?Breakfast:1/2 [place] and havarti cheese wrap with sour cream (seems to give good energy) + monster energy drink, sugar free ?Snack: none ?Lunch: other 1/2 of wrap ?Snack: none ?Supper: chicken + mashed potatoes or noodles,  + veg. On weekends -- sometimes "cheat" with pizza or fast food ?Snack: none ?Beverages: water, 1 sugar free monster drink, sparkling water  ? ?Intervention:  ? ?Nutrition Care Education: ?  ?Basic nutrition: basic food groups; appropriate nutrient balance; general nutrition guidelines    ?Weight control: importance of low sugar and low fat choices, healthy food portions ?Hypertriglyceridemia:  healthy and unhealthy fats;  controlling intake of refined carbohydrates; role of fiber, plant sterols, role of exercise; Mediterranean eating pattern ?Iron deficiency:  iron rich foods; consuming iron sources with vitamin C sources; use of cast iron cookware ? ? ?Nutritional Diagnosis:  [place]-2.1 Inpaired nutrition utilization and [place]-2.2 Altered nutrition-related laboratory As related to hypertriglyceridemia, history of iron deficiency.  As evidenced by patient taking iron infusions for normal iron status, triglycerides normalized with medication and diet changes. ?[place]-3.3 Overweight/obesity As related to history of excess calories, inadequate physical activity, stress/ anxiety.  As evidenced by patient with current BMI of 44, making lifestyle changes to promote weight loss and reduce health risk. ? ? ?Education Materials given:  ?General diet guidelines for Cholesterol-lowering/ Heart health ?Plate Planner with food lists ?Iron Content of Foods list (NCM) ?Get Healthy with Mediterranean Style Eating handout ?Visit summary with goals/ instructions to be viewed via MyChart ? ? ?Learner/ who was taught:  ?Patient  ?Spouse ? ?Level of understanding: ?Verbalizes/ demonstrates competency ? ? ?Demonstrated degree of understanding via:   Teach back ?Learning barriers: ?None ? ?Willingness to learn/ readiness for change: ?Eager, change in progress ? ? ?Monitoring and Evaluation:  Dietary intake, exercise, blood lipids, iron status, and body weight ?     follow up: prn; patient does not feel she needs MNT follow up at this time.  ?

## 2021-05-22 ENCOUNTER — Encounter: Payer: Self-pay | Admitting: Family Medicine

## 2021-05-22 MED ORDER — HYDROCORTISONE 1 % EX CREA: 1.0000 "application " | TOPICAL_CREAM | Freq: Two times a day (BID) | CUTANEOUS | 0 refills | Status: DC

## 2021-05-28 ENCOUNTER — Inpatient Hospital Stay: Payer: BC Managed Care – PPO | Attending: Oncology

## 2021-05-28 DIAGNOSIS — R102 Pelvic and perineal pain: Secondary | ICD-10-CM | POA: Diagnosis not present

## 2021-05-28 DIAGNOSIS — G8929 Other chronic pain: Secondary | ICD-10-CM | POA: Insufficient documentation

## 2021-05-28 DIAGNOSIS — R5383 Other fatigue: Secondary | ICD-10-CM | POA: Diagnosis not present

## 2021-05-28 DIAGNOSIS — F17210 Nicotine dependence, cigarettes, uncomplicated: Secondary | ICD-10-CM | POA: Diagnosis not present

## 2021-05-28 DIAGNOSIS — N920 Excessive and frequent menstruation with regular cycle: Secondary | ICD-10-CM | POA: Insufficient documentation

## 2021-05-28 DIAGNOSIS — R110 Nausea: Secondary | ICD-10-CM | POA: Diagnosis not present

## 2021-05-28 DIAGNOSIS — Z803 Family history of malignant neoplasm of breast: Secondary | ICD-10-CM | POA: Insufficient documentation

## 2021-05-28 DIAGNOSIS — D509 Iron deficiency anemia, unspecified: Secondary | ICD-10-CM | POA: Diagnosis present

## 2021-05-28 DIAGNOSIS — E611 Iron deficiency: Secondary | ICD-10-CM

## 2021-05-28 LAB — FERRITIN: Ferritin: 15 ng/mL (ref 11–307)

## 2021-05-28 LAB — CBC WITH DIFFERENTIAL/PLATELET
Abs Immature Granulocytes: 0.04 10*3/uL (ref 0.00–0.07)
Basophils Absolute: 0 10*3/uL (ref 0.0–0.1)
Basophils Relative: 0 %
Eosinophils Absolute: 0 10*3/uL (ref 0.0–0.5)
Eosinophils Relative: 0 %
HCT: 45.3 % (ref 36.0–46.0)
Hemoglobin: 14.8 g/dL (ref 12.0–15.0)
Immature Granulocytes: 0 %
Lymphocytes Relative: 30 %
Lymphs Abs: 3.1 10*3/uL (ref 0.7–4.0)
MCH: 28 pg (ref 26.0–34.0)
MCHC: 32.7 g/dL (ref 30.0–36.0)
MCV: 85.6 fL (ref 80.0–100.0)
Monocytes Absolute: 0.5 10*3/uL (ref 0.1–1.0)
Monocytes Relative: 5 %
Neutro Abs: 6.5 10*3/uL (ref 1.7–7.7)
Neutrophils Relative %: 65 %
Platelets: 420 10*3/uL — ABNORMAL HIGH (ref 150–400)
RBC: 5.29 MIL/uL — ABNORMAL HIGH (ref 3.87–5.11)
RDW: 14.1 % (ref 11.5–15.5)
WBC: 10.2 10*3/uL (ref 4.0–10.5)
nRBC: 0 % (ref 0.0–0.2)

## 2021-05-28 LAB — IRON AND TIBC
Iron: 82 ug/dL (ref 28–170)
Saturation Ratios: 18 % (ref 10.4–31.8)
TIBC: 468 ug/dL — ABNORMAL HIGH (ref 250–450)
UIBC: 386 ug/dL

## 2021-05-30 ENCOUNTER — Encounter: Payer: Self-pay | Admitting: Oncology

## 2021-05-30 ENCOUNTER — Inpatient Hospital Stay (HOSPITAL_BASED_OUTPATIENT_CLINIC_OR_DEPARTMENT_OTHER): Payer: BC Managed Care – PPO | Admitting: Oncology

## 2021-05-30 ENCOUNTER — Inpatient Hospital Stay: Payer: BC Managed Care – PPO

## 2021-05-30 VITALS — BP 136/91 | HR 97 | Temp 97.5°F | Wt 233.5 lb

## 2021-05-30 DIAGNOSIS — E611 Iron deficiency: Secondary | ICD-10-CM

## 2021-05-30 DIAGNOSIS — D509 Iron deficiency anemia, unspecified: Secondary | ICD-10-CM | POA: Diagnosis not present

## 2021-05-30 DIAGNOSIS — R110 Nausea: Secondary | ICD-10-CM

## 2021-05-30 DIAGNOSIS — N920 Excessive and frequent menstruation with regular cycle: Secondary | ICD-10-CM | POA: Diagnosis not present

## 2021-05-30 NOTE — Progress Notes (Signed)
?Hematology/Oncology Progress note ?[phone] [phone] ?  ? ? ? ?Patient Care Team: ?Dorcas CarrowJohnson, Megan P, DO as PCP - General (Family Medicine) ?Rickard PatienceYu, Jakaiden Fill, MD as Consulting Physician (Hematology) ? ?REFERRING PROVIDER: ?Dorcas CarrowJohnson, Megan P, DO ?CHIEF COMPLAINTS/REASON FOR VISIT:  ?iron deficiency anemia ? ?HISTORY OF PRESENTING ILLNESS:  ?Mariah Gonzalez is a  31 y.o.  female with PMH listed below was seen in consultation at the request of Dorcas CarrowJohnson, Megan P, DO  for evaluation of iron deficiency anemia.  ? ?Reviewed patient's recent labs  ?02/07/2021 labs revealed anemia with hemoglobin of 11.2 [reference 11.1-15.9g/dl] ?MCV 75, white count 14.3, normal platelet count 358,000. ?Iron saturation 6, ferritin of 9. ? ?Patient reports heavy menstrual period, also chronic pelvic pain.  She is currently not following up gynecology after Dr. Elesa MassedWard relocated. ?Reports feeling tired and fatigued. ?She recalls having some bleeding complication after tonsil resection. ? ?Recent blood work also showed extremely high triglyceride, >1000.  Primary care provider has prescribed fenofibrate. ?History of IBS, has previously tried oral iron supplementation, not able to tolerate due to GI side effects. ? ?INTERVAL HISTORY ?Mariah Gonzalez is a 31 y.o. female who has above history reviewed by me today presents for follow up visit iron deficiency anemia.  Patient has received IV Venofer weekly x3. ?Patient reports having intermittent nausea and vomiting symptoms since first dose of IV Venofer.  Per patient, she told her infusion RN during her third treatment.  ?patient continues to have persistent nausea, no vomiting ?Patient also has had blood in the stool. She is going to further discuss during her GI appointment. ? ?Review of Systems  ?Constitutional:  Positive for fatigue. Negative for appetite change, chills and fever.  ?HENT:   Negative for hearing loss and voice change.   ?Eyes:  Negative for  eye problems.  ?Respiratory:  Negative for chest tightness and cough.   ?Cardiovascular:  Negative for chest pain.  ?Gastrointestinal:  Positive for nausea. Negative for abdominal distention, abdominal pain and blood in stool.  ?Endocrine: Negative for hot flashes.  ?Genitourinary:  Positive for menstrual problem. Negative for difficulty urinating and frequency.   ?     Chronic pelvic pain  ?Musculoskeletal:  Negative for arthralgias.  ?Skin:  Negative for itching and rash.  ?Neurological:  Negative for extremity weakness.  ?Hematological:  Negative for adenopathy. Bruises/bleeds easily.  ?Psychiatric/Behavioral:  Negative for confusion.   ? ?MEDICAL HISTORY:  ?Past Medical History:  ?Diagnosis Date  ? ADHD   ? Anxiety   ? Asthma   ? Depressed   ? DUB (dysfunctional uterine bleeding)   ? Dysfunctional uterine bleeding   ? GERD (gastroesophageal reflux disease)   ? History of COVID-19 01/24/2021  ? IBS (irritable bowel syndrome)   ? IDA (iron deficiency anemia)   ? LGSIL on Pap smear of cervix   ? Migraine without aura and without status migrainosus, not intractable   ? Pelvic pain in female   ? ? ?SURGICAL HISTORY: ?Past Surgical History:  ?Procedure Laterality Date  ? debridement of rught eye    ? gum grafting    ? TONSILLECTOMY Bilateral 07/03/2020  ? Procedure: TONSILLECTOMY;  Surgeon: Geanie LoganBennett, Paul, MD;  [facility];  Service: ENT;  Laterality: Bilateral;  ? WISDOM TOOTH EXTRACTION    ? ? ?SOCIAL HISTORY: ?Social History  ? ?Socioeconomic History  ? Marital status: Married  ?  Spouse name: Not on file  ? Number of children: Not on file  ? Years  of education: Not on file  ? Highest education level: Not on file  ?Occupational History  ? Not on file  ?Tobacco Use  ? Smoking status: Every Day  ?  Packs/day: 1.00  ?  Years: 2.00  ?  Pack years: 2.00  ?  Types: Cigarettes  ?  Passive exposure: Never  ? Smokeless tobacco: Never  ?Vaping Use  ? Vaping Use: Never used  ?Substance and Sexual Activity  ?  Alcohol use: Not Currently  ? Drug use: Yes  ?  Types: Marijuana  ?  Comment: Delta 8  ? Sexual activity: Yes  ?Other Topics Concern  ? Not on file  ?Social History Narrative  ? Not on file  ? ?Social Determinants of Health  ? ?Financial Resource Strain: Not on file  ?Food Insecurity: Not on file  ?Transportation Needs: Not on file  ?Physical Activity: Not on file  ?Stress: Not on file  ?Social Connections: Not on file  ?Intimate Partner Violence: Not on file  ? ? ?FAMILY HISTORY: ?Family History  ?Problem Relation Age of Onset  ? Hyperlipidemia Mother   ? Hypertension Mother   ? Depression Mother   ? Hyperlipidemia Father   ? Hypertension Father   ? Mental illness Father   ? Hypertension Brother   ? Breast cancer Maternal Aunt   ? Breast cancer Maternal Aunt   ? Cancer Maternal Uncle   ? Breast cancer Paternal Aunt   ? Hearing loss Paternal Aunt   ? Cancer Paternal Uncle   ? Hearing loss Paternal Uncle   ? Diabetes Paternal Uncle   ? Diabetes Paternal Grandfather   ? ? ?ALLERGIES:  is allergic to eggs or egg-derived products. ? ?MEDICATIONS:  ?Current Outpatient Medications  ?Medication Sig Dispense Refill  ? albuterol (PROVENTIL) (2.5 MG/3ML) 0.083% nebulizer solution Take 3 mLs (2.5 mg total) by nebulization every 6 (six) hours as needed for wheezing or shortness of breath. 150 mL 6  ? albuterol (VENTOLIN HFA) 108 (90 Base) MCG/ACT inhaler INHALE 2 PUFFS INTO THE LUNGS EVERY 6 HOURS AS NEEDED FOR WHEEZING OR SHORTNESS OF BREATH 18 g 3  ? amphetamine-dextroamphetamine (ADDERALL XR) 15 MG 24 hr capsule Take by mouth every morning.    ? ARIPiprazole (ABILIFY) 10 MG tablet Take 10 mg by mouth daily.    ? baclofen (LIORESAL) 10 MG tablet TAKE 1/2 TO 1 TABLET(5 TO 10 MG) BY MOUTH THREE TIMES DAILY 90 tablet 3  ? cyclobenzaprine (FLEXERIL) 10 MG tablet Take 1 tablet (10 mg total) by mouth at bedtime. 30 tablet 2  ? dextroamphetamine (DEXTROSTAT) 5 MG tablet Take by mouth.    ? fenofibrate (TRICOR) 145 MG tablet Take 1  tablet (145 mg total) by mouth daily. 30 tablet 3  ? Fluticasone-Umeclidin-Vilant (TRELEGY ELLIPTA) 100-62.5-25 MCG/INH AEPB Inhale 1 puff into the lungs daily. 1 each 11  ? gabapentin (NEURONTIN) 100 MG capsule Take 200 mg by mouth at bedtime. 90 capsule 3  ? hydrocortisone (ANUSOL-HC) 25 MG suppository Place 1 suppository (25 mg total) rectally 2 (two) times daily. 12 suppository 3  ? hydrocortisone cream 1 % Apply 1 application. topically 2 (two) times daily. 30 g 0  ? lamoTRIgine (LAMICTAL) 200 MG tablet TAKE 1 TABLET(200 MG) BY MOUTH DAILY 90 tablet 0  ? lamoTRIgine (LAMICTAL) 25 MG tablet TAKE 2 TABLETS BY MOUTH EVERY DAY. TO BE COMBINED WITH 200MG  EVERY DAY AT BEDTIME 180 tablet 0  ? lidocaine (XYLOCAINE) 2 % solution SMARTSIG:Milliliter(s) Topical Daily PRN    ?  LORazepam (ATIVAN) 0.5 MG tablet Take 1 tablet (0.5 mg total) by mouth as directed. Take 1 tablet 1-2 times a day as needed for severe anxiety attacks only - please limit use 21 tablet 0  ? MELATONIN PO Take by mouth.    ? omeprazole (PRILOSEC) 40 MG capsule TAKE 1 CAPSULE(40 MG) BY MOUTH BID 180 capsule 0  ? ondansetron (ZOFRAN) 4 MG tablet Take 1 tablet (4 mg total) by mouth every 8 (eight) hours as needed for nausea or vomiting. 60 tablet 3  ? promethazine (PHENERGAN) 25 MG suppository Place 1 suppository (25 mg total) rectally every 6 (six) hours as needed for nausea or vomiting. 30 each 2  ? sucralfate (CARAFATE) 1 g tablet Take 1 tablet (1 g total) by mouth 4 (four) times daily -  with meals and at bedtime. 120 tablet 2  ? traZODone (DESYREL) 50 MG tablet TAKE 2 TABLETS(100 MG) BY MOUTH AT BEDTIME 60 tablet 5  ? ?No current facility-administered medications for this visit.  ? ? ? ?PHYSICAL EXAMINATION: ?ECOG PERFORMANCE STATUS: 0 - Asymptomatic ?Vitals:  ? 05/30/21 1444  ?BP: (!) 136/91  ?Pulse: 97  ?Temp: (!) 97.5 ?F (36.4 ?C)  ? ?Filed Weights  ? 05/30/21 1444  ?Weight: 233 lb 8 oz (105.9 kg)  ? ? ?Physical Exam ?Constitutional:   ?    General: She is not in acute distress. ?   Appearance: She is obese.  ?HENT:  ?   Head: Normocephalic and atraumatic.  ?Eyes:  ?   General: No scleral icterus. ?Cardiovascular:  ?   Rate and Rhythm: Normal ra

## 2021-06-07 ENCOUNTER — Other Ambulatory Visit: Payer: Self-pay | Admitting: Family Medicine

## 2021-06-07 NOTE — Telephone Encounter (Signed)
Requested Prescriptions  ?Pending Prescriptions Disp Refills  ?? fenofibrate (TRICOR) 145 MG tablet [Pharmacy Med Name: FENOFIBRATE 145MG TABLETS] 30 tablet 3  ?  Sig: TAKE 1 TABLET(145 MG) BY MOUTH DAILY  ?  ? Cardiovascular:  Antilipid - Fibric Acid Derivatives Failed - 06/07/2021  3:21 AM  ?  ?  Failed - Cr in normal range and within 360 days  ?  Creatinine, Ser  ?Date Value Ref Range Status  ?03/22/2021 1.01 (H) 0.57 - 1.00 mg/dL Final  ?   ?  ?  Failed - PLT in normal range and within 360 days  ?  Platelets  ?Date Value Ref Range Status  ?05/28/2021 420 (H) 150 - 400 K/uL Final  ?03/22/2021 356 150 - 450 x10E3/uL Final  ?   ?  ?  Failed - Lipid Panel in normal range within the last 12 months  ?  Cholesterol, Total  ?Date Value Ref Range Status  ?03/22/2021 177 100 - 199 mg/dL Final  ? ?LDL Chol Calc (NIH)  ?Date Value Ref Range Status  ?03/22/2021 113 (H) 0 - 99 mg/dL Final  ? ?HDL  ?Date Value Ref Range Status  ?03/22/2021 46 >39 mg/dL Final  ? ?Triglycerides  ?Date Value Ref Range Status  ?03/22/2021 98 0 - 149 mg/dL Final  ? ?  ?  ?  Passed - ALT in normal range and within 360 days  ?  ALT  ?Date Value Ref Range Status  ?03/22/2021 14 0 - 32 IU/L Final  ?   ?  ?  Passed - AST in normal range and within 360 days  ?  AST  ?Date Value Ref Range Status  ?03/22/2021 15 0 - 40 IU/L Final  ?   ?  ?  Passed - HGB in normal range and within 360 days  ?  Hemoglobin  ?Date Value Ref Range Status  ?05/28/2021 14.8 12.0 - 15.0 g/dL Final  ?03/22/2021 13.5 11.1 - 15.9 g/dL Final  ?   ?  ?  Passed - HCT in normal range and within 360 days  ?  HCT  ?Date Value Ref Range Status  ?05/28/2021 45.3 36.0 - 46.0 % Final  ? ?Hematocrit  ?Date Value Ref Range Status  ?03/22/2021 42.3 34.0 - 46.6 % Final  ?   ?  ?  Passed - WBC in normal range and within 360 days  ?  WBC  ?Date Value Ref Range Status  ?05/28/2021 10.2 4.0 - 10.5 K/uL Final  ?   ?  ?  Passed - eGFR is 30 or above and within 360 days  ?  GFR calc Af Amer  ?Date Value  Ref Range Status  ?03/26/2020 139 >59 mL/min/1.73 Final  ?  Comment:  ?  **In accordance with recommendations from the NKF-ASN Task force,** ?  [facility] is in the process of updating its eGFR calculation to the ?  2021 CKD-EPI creatinine equation that estimates kidney function ?  without a race variable. ?  ? ?GFR, Estimated  ?Date Value Ref Range Status  ?07/09/2020 >60 >60 mL/min Final  ?  Comment:  ?  (NOTE) ?Calculated using the CKD-EPI Creatinine Equation (2021) ?  ? ?eGFR  ?Date Value Ref Range Status  ?03/22/2021 78 >59 mL/min/1.73 Final  ?   ?  ?  Passed - Valid encounter within last 12 months  ?  Recent Outpatient Visits   ?      ? 2 weeks ago Internal hemorrhage  ? [facility] [place], Megan P, DO  ? 2 months ago Nausea and vomiting, unspecified vomiting type  ? [place] P, DO  ? 2 months ago Nausea and vomiting, unspecified vomiting type  ? [place], DO  ? 4 months ago Routine general medical examination at a health care facility  ? [place], Megan P, DO  ? 4 months ago COVID-19  ? [place] [facility], [place] P, DO  ?  ?  ?Future Appointments   ?        ? In 2 months Wynetta Emery, Barb Merino, DO [facility]  ?  ? ?  ?  ?  ? ?

## 2021-06-18 ENCOUNTER — Other Ambulatory Visit: Payer: Self-pay | Admitting: Family Medicine

## 2021-06-19 NOTE — Telephone Encounter (Signed)
Requested Prescriptions  ?Pending Prescriptions Disp Refills  ?? sucralfate (CARAFATE) 1 g tablet [Pharmacy Med Name: SUCRALFATE 1GM TABLETS] 120 tablet 2  ?  Sig: TAKE 1 TABLET(1 GRAM) BY MOUTH FOUR TIMES DAILY WITH MEALS AND AT BEDTIME  ?  ? Gastroenterology: Antiacids Passed - 06/18/2021  3:21 AM  ?  ?  Passed - Valid encounter within last 12 months  ?  Recent Outpatient Visits   ?      ? 1 month ago Internal hemorrhage  ? [facility] [place], [place] P, DO  ? 2 months ago Nausea and vomiting, unspecified vomiting type  ? [facility] [place], [place] P, DO  ? 2 months ago Nausea and vomiting, unspecified vomiting type  ? [facility] [place], Megan P, DO  ? 4 months ago Routine general medical examination at a health care facility  ? [facility] [place], Megan P, DO  ? 4 months ago COVID-19  ? [facility] [place], [place] P, DO  ?  ?  ?Future Appointments   ?        ? In 1 month Johnson, Oralia Rud, DO [facility]  ?  ? ?  ?  ?  ? ?

## 2021-07-18 ENCOUNTER — Ambulatory Visit: Payer: BC Managed Care – PPO | Admitting: Gastroenterology

## 2021-07-18 ENCOUNTER — Other Ambulatory Visit: Payer: Self-pay

## 2021-07-18 ENCOUNTER — Encounter: Payer: Self-pay | Admitting: Gastroenterology

## 2021-07-18 VITALS — BP 146/98 | HR 96 | Temp 99.5°F | Ht 61.0 in | Wt 236.0 lb

## 2021-07-18 DIAGNOSIS — R112 Nausea with vomiting, unspecified: Secondary | ICD-10-CM | POA: Diagnosis not present

## 2021-07-18 DIAGNOSIS — K219 Gastro-esophageal reflux disease without esophagitis: Secondary | ICD-10-CM | POA: Diagnosis not present

## 2021-07-18 DIAGNOSIS — K5909 Other constipation: Secondary | ICD-10-CM

## 2021-07-18 NOTE — Patient Instructions (Signed)
Gave Linzess 145 samples. Please let us know how they work and we can call you in a prescription

## 2021-07-18 NOTE — Progress Notes (Signed)
Arlyss Repress, MD [address]  [address]  [place], [place] [address]  Main: [phone]  Fax: [phone]    Gastroenterology Consultation  Referring Provider:     Dorcas Carrow, DO Primary Care Physician:  Dorcas Carrow, DO Primary Gastroenterologist:  Dr. Arlyss Repress Reason for Consultation: Nausea, vomiting, chronic constipation        HPI:   Mariah Gonzalez is a 31 y.o. female referred by Dr. Laural Benes, Oralia Rud, DO  for consultation & management of approximately 4 months history of nausea, vomiting.  Patient reports that she has been receiving iron infusions for iron deficiency anemia since January 2023.  Since then, anxiety has worsened and she thinks it is due to iron infusions.  She was experiencing nausea associated with vomiting at least twice a day.  She also reports history of chronic GERD for several years, she was taking omeprazole 40 mg once daily and stopped for quite some time.  She restarted taking omeprazole 40 mg once a day for her reflux symptoms.  Patient reports that she has history of chronic constipation since her childhood and has been worsening lately.  She reports that she was treated with sucralfate for her nausea recently which has helped with her constipation.  She stopped taking sucralfate because it did not help with her nausea.  She reports an episode of rectal bleeding few weeks ago and did not recur since then.  Patient reports that overall her nausea and vomiting have improved but not resolved.  She reports early morning nausea, denies vomiting anymore.  Her weight has been stable.  She has history of chronic iron deficiency anemia for more than a year secondary to menorrhagia.  Patient is responding well to iron infusions, most recent hemoglobin in 05/2021, normal TSH.  Patient smokes tobacco and marijuana.  States that marijuana is in fact helping her with nausea  NSAIDs: None  Antiplts/Anticoagulants/Anti thrombotics:  None  GI Procedures: None  Past Medical History:  Diagnosis Date   ADHD    Anxiety    Asthma    Depressed    DUB (dysfunctional uterine bleeding)    Dysfunctional uterine bleeding    GERD (gastroesophageal reflux disease)    History of COVID-19 01/24/2021   IBS (irritable bowel syndrome)    IDA (iron deficiency anemia)    LGSIL on Pap smear of cervix    Migraine without aura and without status migrainosus, not intractable    Pelvic pain in female     Past Surgical History:  Procedure Laterality Date   debridement of rught eye     gum grafting     TONSILLECTOMY Bilateral 07/03/2020   Procedure: TONSILLECTOMY;  Surgeon: Geanie Logan, MD;  Location: [facility];  Service: ENT;  Laterality: Bilateral;   WISDOM TOOTH EXTRACTION      Current Outpatient Medications:    albuterol (PROVENTIL) (2.5 MG/3ML) 0.083% nebulizer solution, Take 3 mLs (2.5 mg total) by nebulization every 6 (six) hours as needed for wheezing or shortness of breath., Disp: 150 mL, Rfl: 6   albuterol (VENTOLIN HFA) 108 (90 Base) MCG/ACT inhaler, INHALE 2 PUFFS INTO THE LUNGS EVERY 6 HOURS AS NEEDED FOR WHEEZING OR SHORTNESS OF BREATH, Disp: 18 g, Rfl: 3   amphetamine-dextroamphetamine (ADDERALL XR) 15 MG 24 hr capsule, Take by mouth every morning., Disp: , Rfl:    ARIPiprazole (ABILIFY) 10 MG tablet, Take 10 mg by mouth daily., Disp: , Rfl:    baclofen (LIORESAL)  10 MG tablet, TAKE 1/2 TO 1 TABLET(5 TO 10 MG) BY MOUTH THREE TIMES DAILY, Disp: 90 tablet, Rfl: 3   cyclobenzaprine (FLEXERIL) 10 MG tablet, Take 1 tablet (10 mg total) by mouth at bedtime., Disp: 30 tablet, Rfl: 2   dextroamphetamine (DEXTROSTAT) 5 MG tablet, Take by mouth., Disp: , Rfl:    fenofibrate (TRICOR) 145 MG tablet, TAKE 1 TABLET(145 MG) BY MOUTH DAILY, Disp: 30 tablet, Rfl: 3   Fluticasone-Umeclidin-Vilant (TRELEGY ELLIPTA) 100-62.5-25 MCG/INH AEPB, Inhale 1 puff into the lungs daily., Disp: 1 each, Rfl: 11   gabapentin (NEURONTIN)  100 MG capsule, Take 200 mg by mouth at bedtime., Disp: 90 capsule, Rfl: 3   hydrocortisone (ANUSOL-HC) 25 MG suppository, Place 1 suppository (25 mg total) rectally 2 (two) times daily., Disp: 12 suppository, Rfl: 3   hydrocortisone cream 1 %, Apply 1 application. topically 2 (two) times daily., Disp: 30 g, Rfl: 0   lamoTRIgine (LAMICTAL) 200 MG tablet, TAKE 1 TABLET(200 MG) BY MOUTH DAILY, Disp: 90 tablet, Rfl: 0   lamoTRIgine (LAMICTAL) 25 MG tablet, TAKE 2 TABLETS BY MOUTH EVERY DAY. TO BE COMBINED WITH 200MG  EVERY DAY AT BEDTIME, Disp: 180 tablet, Rfl: 0   lidocaine (XYLOCAINE) 2 % solution, SMARTSIG:Milliliter(s) Topical Daily PRN, Disp: , Rfl:    LORazepam (ATIVAN) 0.5 MG tablet, Take 1 tablet (0.5 mg total) by mouth as directed. Take 1 tablet 1-2 times a day as needed for severe anxiety attacks only - please limit use, Disp: 21 tablet, Rfl: 0   MELATONIN PO, Take by mouth., Disp: , Rfl:    omeprazole (PRILOSEC) 40 MG capsule, TAKE 1 CAPSULE(40 MG) BY MOUTH BID, Disp: 180 capsule, Rfl: 0   ondansetron (ZOFRAN) 4 MG tablet, Take 1 tablet (4 mg total) by mouth every 8 (eight) hours as needed for nausea or vomiting., Disp: 60 tablet, Rfl: 3   promethazine (PHENERGAN) 25 MG suppository, Place 1 suppository (25 mg total) rectally every 6 (six) hours as needed for nausea or vomiting., Disp: 30 each, Rfl: 2   sucralfate (CARAFATE) 1 g tablet, TAKE 1 TABLET(1 GRAM) BY MOUTH FOUR TIMES DAILY WITH MEALS AND AT BEDTIME, Disp: 120 tablet, Rfl: 2   traZODone (DESYREL) 50 MG tablet, TAKE 2 TABLETS(100 MG) BY MOUTH AT BEDTIME, Disp: 60 tablet, Rfl: 5  Family History  Problem Relation Age of Onset   Hyperlipidemia Mother    Hypertension Mother    Depression Mother    Hyperlipidemia Father    Hypertension Father    Mental illness Father    Hypertension Brother    Breast cancer Maternal Aunt    Breast cancer Maternal Aunt    Cancer Maternal Uncle    Breast cancer Paternal Aunt    Hearing loss  Paternal Aunt    Cancer Paternal Uncle    Hearing loss Paternal Uncle    Diabetes Paternal Uncle    Diabetes Paternal Grandfather      Social History   Tobacco Use   Smoking status: Every Day    Packs/day: 1.00    Years: 2.00    Total pack years: 2.00    Types: Cigarettes    Passive exposure: Never   Smokeless tobacco: Never  Vaping Use   Vaping Use: Never used  Substance Use Topics   Alcohol use: Not Currently   Drug use: Yes    Types: Marijuana    Comment: Delta 8    Allergies as of 07/18/2021 - Review Complete 07/18/2021  Allergen Reaction Noted  Eggs or egg-derived products Diarrhea 12/19/2019    Review of Systems:    All systems reviewed and negative except where noted in HPI.   Physical Exam:  BP (!) 146/98 (BP Location: Left Arm, Patient Position: Sitting, Cuff Size: Large)   Pulse 96   Temp 99.5 F (37.5 C) (Oral)   Ht 5\' 1"  (1.549 m)   Wt 236 lb (107 kg)   BMI 44.59 kg/m  No LMP recorded.  General:   Alert,  Well-developed, well-nourished, pleasant and cooperative in NAD Head:  Normocephalic and atraumatic. Eyes:  Sclera clear, no icterus.   Conjunctiva pink. Ears:  Normal auditory acuity. Nose:  No deformity, discharge, or lesions. Mouth:  No deformity or lesions,oropharynx pink & moist. Neck:  Supple; no masses or thyromegaly. Lungs:  Respirations even and unlabored.  Clear throughout to auscultation.   No wheezes, crackles, or rhonchi. No acute distress. Heart:  Regular rate and rhythm; no murmurs, clicks, rubs, or gallops. Abdomen:  Normal bowel sounds. Soft, non-tender and non-distended without masses, hepatosplenomegaly or hernias noted.  No guarding or rebound tenderness.   Rectal: Not performed Msk:  Symmetrical without gross deformities. Good, equal movement & strength bilaterally. Pulses:  Normal pulses noted. Extremities:  No clubbing or edema.  No cyanosis. Neurologic:  Alert and oriented x3;  grossly normal neurologically. Skin:   Intact without significant lesions or rashes. No jaundice. Psych:  Alert and cooperative. Normal mood and affect.  Imaging Studies: None  Assessment and Plan:   Mariah Gonzalez is a 31 y.o. female with morbid obesity, BMI 45, history of anxiety, ADHD, depression, history of chronic iron deficiency anemia, chronic GERD is seen in consultation for approximately 6 months history of nausea, intermittent episodes of vomiting and constipation  History of chronic GERD, nausea, intermittent vomiting Recommend EGD for further evaluation Increase omeprazole 40 mg to twice daily before meals Recommend right upper quadrant ultrasound if EGD is unremarkable  Chronic constipation Discussed about high-fiber diet Adequate intake of water Trial of Linzess 145 mcg, samples provided  Rectal bleeding: Worse with constipation, If bleeding recurs, recommend diagnostic colonoscopy  History of iron deficiency anemia secondary to menorrhagia Currently resolved as patient is responding to parenteral iron therapy Serum ferritin levels are still low but improving   Follow up based on the above work-up, contact via MyChart as needed   37, MD

## 2021-07-26 ENCOUNTER — Other Ambulatory Visit: Payer: Self-pay | Admitting: Family Medicine

## 2021-07-26 NOTE — Telephone Encounter (Signed)
Requested Prescriptions  Pending Prescriptions Disp Refills  . albuterol (VENTOLIN HFA) 108 (90 Base) MCG/ACT inhaler [Pharmacy Med Name: ALBUTEROL HFA INH(200 PUFFS) 18GM] 18 g 3    Sig: INHALE 2 PUFFS INTO THE LUNGS EVERY 6 HOURS AS NEEDED FOR WHEEZING OR SHORTNESS OF BREATH     Pulmonology:  Beta Agonists 2 Failed - 07/26/2021  3:19 AM      Failed - Last BP in normal range    BP Readings from Last 1 Encounters:  07/18/21 (!) 146/98         Passed - Last Heart Rate in normal range    Pulse Readings from Last 1 Encounters:  07/18/21 96         Passed - Valid encounter within last 12 months    Recent Outpatient Visits          2 months ago Internal hemorrhage   [facility] [place], Megan P, DO   3 months ago Nausea and vomiting, unspecified vomiting type   [facility], Megan P, DO   4 months ago Nausea and vomiting, unspecified vomiting type   [facility] [place], Megan P, DO   5 months ago Routine general medical examination at a health care facility   [facility] [place], [place], DO   6 months ago COVID-19   [facility] [place], [place], DO      Future Appointments            In 1 week Laural Benes, Oralia Rud, DO [facility]

## 2021-08-05 ENCOUNTER — Ambulatory Visit
Admission: RE | Admit: 2021-08-05 | Discharge: 2021-08-05 | Disposition: A | Discharge status: Home / Self Care | Payer: BC Managed Care – PPO | Attending: Gastroenterology | Admitting: Gastroenterology

## 2021-08-05 ENCOUNTER — Ambulatory Visit: Payer: BC Managed Care – PPO | Admitting: Anesthesiology

## 2021-08-05 ENCOUNTER — Encounter
Admission: RE | Disposition: A | Discharge status: Home / Self Care | Payer: Self-pay | Source: Home / Self Care | Attending: Gastroenterology

## 2021-08-05 ENCOUNTER — Encounter: Payer: Self-pay | Admitting: Gastroenterology

## 2021-08-05 DIAGNOSIS — R110 Nausea: Secondary | ICD-10-CM

## 2021-08-05 DIAGNOSIS — Z7951 Long term (current) use of inhaled steroids: Secondary | ICD-10-CM | POA: Insufficient documentation

## 2021-08-05 DIAGNOSIS — K219 Gastro-esophageal reflux disease without esophagitis: Secondary | ICD-10-CM

## 2021-08-05 DIAGNOSIS — F17210 Nicotine dependence, cigarettes, uncomplicated: Secondary | ICD-10-CM | POA: Diagnosis not present

## 2021-08-05 DIAGNOSIS — R112 Nausea with vomiting, unspecified: Secondary | ICD-10-CM

## 2021-08-05 DIAGNOSIS — Z79899 Other long term (current) drug therapy: Secondary | ICD-10-CM | POA: Insufficient documentation

## 2021-08-05 DIAGNOSIS — J45909 Unspecified asthma, uncomplicated: Secondary | ICD-10-CM | POA: Diagnosis not present

## 2021-08-05 DIAGNOSIS — K589 Irritable bowel syndrome without diarrhea: Secondary | ICD-10-CM | POA: Insufficient documentation

## 2021-08-05 DIAGNOSIS — F419 Anxiety disorder, unspecified: Secondary | ICD-10-CM | POA: Diagnosis not present

## 2021-08-05 DIAGNOSIS — Z6841 Body Mass Index (BMI) 40.0 and over, adult: Secondary | ICD-10-CM | POA: Diagnosis not present

## 2021-08-05 DIAGNOSIS — F32A Depression, unspecified: Secondary | ICD-10-CM | POA: Diagnosis not present

## 2021-08-05 DIAGNOSIS — K2289 Other specified disease of esophagus: Secondary | ICD-10-CM | POA: Diagnosis not present

## 2021-08-05 DIAGNOSIS — F909 Attention-deficit hyperactivity disorder, unspecified type: Secondary | ICD-10-CM | POA: Insufficient documentation

## 2021-08-05 HISTORY — PX: ESOPHAGOGASTRODUODENOSCOPY (EGD) WITH PROPOFOL: SHX5813

## 2021-08-05 SURGERY — ESOPHAGOGASTRODUODENOSCOPY (EGD) WITH PROPOFOL
Anesthesia: General

## 2021-08-05 MED ORDER — PROPOFOL 500 MG/50ML IV EMUL
INTRAVENOUS | Status: DC | PRN
  Administered 2021-08-05: 200 ug/kg/min via INTRAVENOUS

## 2021-08-05 MED ORDER — LIDOCAINE 2% (20 MG/ML) 5 ML SYRINGE
INTRAMUSCULAR | Status: DC | PRN
  Administered 2021-08-05: 50 mg via INTRAVENOUS

## 2021-08-05 MED ORDER — DEXMEDETOMIDINE (PRECEDEX) IN NS 20 MCG/5ML (4 MCG/ML) IV SYRINGE
PREFILLED_SYRINGE | INTRAVENOUS | Status: DC | PRN
  Administered 2021-08-05: 8 ug via INTRAVENOUS

## 2021-08-05 MED ORDER — SODIUM CHLORIDE 0.9 % IV SOLN: INTRAVENOUS | Status: DC

## 2021-08-05 MED ORDER — IPRATROPIUM-ALBUTEROL 0.5-2.5 (3) MG/3ML IN SOLN
3.0000 mL | Freq: Once | RESPIRATORY_TRACT | Status: AC
Start: 2021-08-05 — End: 2021-08-05

## 2021-08-05 MED ORDER — IPRATROPIUM-ALBUTEROL 0.5-2.5 (3) MG/3ML IN SOLN
RESPIRATORY_TRACT | Status: AC
  Filled 2021-08-05: qty 3

## 2021-08-05 MED ORDER — PROPOFOL 10 MG/ML IV BOLUS
INTRAVENOUS | Status: DC | PRN
  Administered 2021-08-05: 100 mg via INTRAVENOUS

## 2021-08-05 NOTE — H&P (Signed)
Arlyss Repress, MD [address]  [address]  [place], [place] [address]  Main: [phone]  Fax: [phone] Pager: [phone]  Primary Care Physician:  Dorcas Carrow, DO Primary Gastroenterologist:  Dr. Arlyss Repress  Pre-Procedure History & Physical: HPI:  Mariah Gonzalez is a 31 y.o. female is here for an endoscopy.   Past Medical History:  Diagnosis Date   ADHD    Anxiety    Asthma    Depressed    DUB (dysfunctional uterine bleeding)    Dysfunctional uterine bleeding    GERD (gastroesophageal reflux disease)    History of COVID-19 01/24/2021   IBS (irritable bowel syndrome)    IDA (iron deficiency anemia)    LGSIL on Pap smear of cervix    Migraine without aura and without status migrainosus, not intractable    Pelvic pain in female     Past Surgical History:  Procedure Laterality Date   debridement of rught eye     gum grafting     TONSILLECTOMY Bilateral 07/03/2020   Procedure: TONSILLECTOMY;  Surgeon: Geanie Logan, MD;  Location: [facility];  Service: ENT;  Laterality: Bilateral;   WISDOM TOOTH EXTRACTION      Prior to Admission medications   Medication Sig Start Date End Date Taking? Authorizing Provider  albuterol (PROVENTIL) (2.5 MG/3ML) 0.083% nebulizer solution Take 3 mLs (2.5 mg total) by nebulization every 6 (six) hours as needed for wheezing or shortness of breath. 05/11/20   Johnson, Megan P, DO  albuterol (VENTOLIN HFA) 108 (90 Base) MCG/ACT inhaler INHALE 2 PUFFS INTO THE LUNGS EVERY 6 HOURS AS NEEDED FOR WHEEZING OR SHORTNESS OF BREATH 07/26/21   Johnson, Megan P, DO  amphetamine-dextroamphetamine (ADDERALL XR) 15 MG 24 hr capsule Take by mouth every morning. 08/01/20   [provider]  ARIPiprazole (ABILIFY) 10 MG tablet Take 10 mg by mouth daily. 05/09/21   [provider]  baclofen (LIORESAL) 10 MG tablet TAKE 1/2 TO 1 TABLET(5 TO 10 MG) BY MOUTH THREE TIMES DAILY 04/09/21   Laural Benes, Megan P, DO   cyclobenzaprine (FLEXERIL) 10 MG tablet Take 1 tablet (10 mg total) by mouth at bedtime. 11/05/20   Dorcas Carrow, DO  dextroamphetamine (DEXTROSTAT) 5 MG tablet Take by mouth. 05/10/21   [provider]  fenofibrate (TRICOR) 145 MG tablet TAKE 1 TABLET(145 MG) BY MOUTH DAILY 06/07/21   Johnson, Megan P, DO  Fluticasone-Umeclidin-Vilant (TRELEGY ELLIPTA) 100-62.5-25 MCG/INH AEPB Inhale 1 puff into the lungs daily. 11/05/20   Johnson, Megan P, DO  gabapentin (NEURONTIN) 100 MG capsule Take 200 mg by mouth at bedtime. 11/05/20   Johnson, Megan P, DO  hydrocortisone (ANUSOL-HC) 25 MG suppository Place 1 suppository (25 mg total) rectally 2 (two) times daily. 05/20/21   Johnson, Megan P, DO  hydrocortisone cream 1 % Apply 1 application. topically 2 (two) times daily. 05/22/21   Johnson, Megan P, DO  lamoTRIgine (LAMICTAL) 200 MG tablet TAKE 1 TABLET(200 MG) BY MOUTH DAILY 04/25/20   Jomarie Longs, MD  lamoTRIgine (LAMICTAL) 25 MG tablet TAKE 2 TABLETS BY MOUTH EVERY DAY. TO BE COMBINED WITH 200MG  EVERY DAY AT BEDTIME 04/25/20   Jomarie Longs, MD  lidocaine (XYLOCAINE) 2 % solution SMARTSIG:Milliliter(s) Topical Daily PRN 04/19/20   [provider]  LORazepam (ATIVAN) 0.5 MG tablet Take 1 tablet (0.5 mg total) by mouth as directed. Take 1 tablet 1-2 times a day as needed for severe anxiety attacks only - please limit use 03/21/20  Jomarie Longs, MD  MELATONIN PO Take by mouth.    [provider]  omeprazole (PRILOSEC) 40 MG capsule TAKE 1 CAPSULE(40 MG) BY MOUTH BID 03/22/21   Johnson, Megan P, DO  ondansetron (ZOFRAN) 4 MG tablet Take 1 tablet (4 mg total) by mouth every 8 (eight) hours as needed for nausea or vomiting. 03/22/21   Laural Benes, Megan P, DO  promethazine (PHENERGAN) 25 MG suppository Place 1 suppository (25 mg total) rectally every 6 (six) hours as needed for nausea or vomiting. 03/22/21   Johnson, Megan P, DO  sucralfate (CARAFATE) 1 g tablet TAKE 1 TABLET(1 GRAM) BY  MOUTH FOUR TIMES DAILY WITH MEALS AND AT BEDTIME 06/19/21   Johnson, Megan P, DO  traZODone (DESYREL) 50 MG tablet TAKE 2 TABLETS(100 MG) BY MOUTH AT BEDTIME 03/07/21   Olevia Perches P, DO    Allergies as of 07/18/2021 - Review Complete 07/18/2021  Allergen Reaction Noted   Eggs or egg-derived products Diarrhea 12/19/2019    Family History  Problem Relation Age of Onset   Hyperlipidemia Mother    Hypertension Mother    Depression Mother    Hyperlipidemia Father    Hypertension Father    Mental illness Father    Hypertension Brother    Breast cancer Maternal Aunt    Breast cancer Maternal Aunt    Cancer Maternal Uncle    Breast cancer Paternal Aunt    Hearing loss Paternal Aunt    Cancer Paternal Uncle    Hearing loss Paternal Uncle    Diabetes Paternal Uncle    Diabetes Paternal Grandfather     Social History   Socioeconomic History   Marital status: Married    Spouse name: Not on file   Number of children: Not on file   Years of education: Not on file   Highest education level: Not on file  Occupational History   Not on file  Tobacco Use   Smoking status: Every Day    Packs/day: 1.00    Years: 2.00    Total pack years: 2.00    Types: Cigarettes    Passive exposure: Never   Smokeless tobacco: Never  Vaping Use   Vaping Use: Never used  Substance and Sexual Activity   Alcohol use: Not Currently    Comment: occas   Drug use: Yes    Types: Marijuana    Comment: Delta 8   Sexual activity: Yes  Other Topics Concern   Not on file  Social History Narrative   Not on file   Social Determinants of Health   Financial Resource Strain: Not on file  Food Insecurity: Not on file  Transportation Needs: Not on file  Physical Activity: Not on file  Stress: Not on file  Social Connections: Not on file  Intimate Partner Violence: Not on file    Review of Systems: See HPI, otherwise negative ROS  Physical Exam: BP 112/64   Pulse 81   Temp (!) 96.6 F (35.9 C)  (Temporal)   Resp (!) 21   SpO2 97%  General:   Alert,  pleasant and cooperative in NAD Head:  Normocephalic and atraumatic. Neck:  Supple; no masses or thyromegaly. Lungs:  Clear throughout to auscultation.    Heart:  Regular rate and rhythm. Abdomen:  Soft, nontender and nondistended. Normal bowel sounds, without guarding, and without rebound.   Neurologic:  Alert and  oriented x4;  grossly normal neurologically.  Impression/Plan: Mariah Gonzalez is here for an endoscopy to be  performed for nausea, intermittent vomiting  Risks, benefits, limitations, and alternatives regarding  endoscopy have been reviewed with the patient.  Questions have been answered.  All parties agreeable.   Lannette Donath, MD  08/05/2021, 12:46 PM

## 2021-08-06 ENCOUNTER — Encounter: Payer: Self-pay | Admitting: Gastroenterology

## 2021-08-06 LAB — SURGICAL PATHOLOGY

## 2021-08-08 ENCOUNTER — Encounter: Payer: Self-pay | Admitting: Family Medicine

## 2021-08-08 ENCOUNTER — Ambulatory Visit (INDEPENDENT_AMBULATORY_CARE_PROVIDER_SITE_OTHER): Payer: BC Managed Care – PPO | Admitting: Family Medicine

## 2021-08-08 VITALS — BP 133/81 | HR 103 | Temp 99.2°F | Wt 228.8 lb

## 2021-08-08 DIAGNOSIS — B070 Plantar wart: Secondary | ICD-10-CM | POA: Diagnosis not present

## 2021-08-08 DIAGNOSIS — E781 Pure hyperglyceridemia: Secondary | ICD-10-CM

## 2021-08-08 DIAGNOSIS — F339 Major depressive disorder, recurrent, unspecified: Secondary | ICD-10-CM

## 2021-08-08 DIAGNOSIS — B351 Tinea unguium: Secondary | ICD-10-CM

## 2021-08-08 DIAGNOSIS — Z862 Personal history of diseases of the blood and blood-forming organs and certain disorders involving the immune mechanism: Secondary | ICD-10-CM

## 2021-08-08 DIAGNOSIS — J4540 Moderate persistent asthma, uncomplicated: Secondary | ICD-10-CM

## 2021-08-08 DIAGNOSIS — E611 Iron deficiency: Secondary | ICD-10-CM | POA: Diagnosis not present

## 2021-08-08 MED ORDER — ALBUTEROL SULFATE (2.5 MG/3ML) 0.083% IN NEBU: 2.5000 mg | INHALATION_SOLUTION | Freq: Four times a day (QID) | RESPIRATORY_TRACT | 6 refills | Status: DC | PRN

## 2021-08-08 MED ORDER — CYCLOBENZAPRINE HCL 10 MG PO TABS
10.0000 mg | ORAL_TABLET | Freq: Every day | ORAL | 2 refills | Status: DC
Start: 2021-08-08 — End: 2023-02-27

## 2021-08-08 MED ORDER — FENOFIBRATE 145 MG PO TABS: 145.0000 mg | ORAL_TABLET | Freq: Every day | ORAL | 1 refills | Status: DC

## 2021-08-08 NOTE — Assessment & Plan Note (Signed)
Rechecking labs today. Await results. Treat as needed.  °

## 2021-08-08 NOTE — Assessment & Plan Note (Signed)
Under good control on current regimen. Continue current regimen. Continue to monitor. Call with any concerns. Refills given. Labs drawn today.   

## 2021-08-08 NOTE — Progress Notes (Signed)
BP 133/81   Pulse (!) 103   Temp 99.2 F (37.3 C) (Oral)   Wt 228 lb 12.8 oz (103.8 kg)   LMP 07/19/2021 (Exact Date)   SpO2 98%   BMI 43.23 kg/m    Subjective:    Patient ID: Mariah Gonzalez, female    DOB: 12-Dec-1990, 31 y.o.   MRN: [id number]  HPI: Mariah Gonzalez is a 31 y.o. female  Chief Complaint  Patient presents with   Hyperlipidemia   Anemia   HYPERLIPIDEMIA Hyperlipidemia status: excellent compliance Satisfied with current treatment?  yes Side effects:  no Medication compliance: excellent compliance Past cholesterol meds: fenofibrate Supplements: none Aspirin:  no The ASCVD Risk score (Arnett DK, et al., 2019) failed to calculate for the following reasons:   The 2019 ASCVD risk score is only valid for ages 45 to 4 Chest pain:  no Coronary artery disease:  no  FOOT PAIN Duration: months Involved foot: bilateral Mechanism of injury:  no injury Location: lateral side of her foot has bumps that are very painful to touch and when she's walking around Onset: gradual  Severity: severe  Quality:  tender Frequency: with pressure Radiation: no Aggravating factors: weight bearing  Alleviating factors: 2 socks  Status: worse Treatments attempted: picking at them  Relief with NSAIDs?:  No NSAIDs Taken Weakness with weight bearing or walking: no Morning stiffness: no Swelling: no Redness: no Bruising: no Paresthesias / decreased sensation: no  Fevers:no  ANEMIA Anemia status: stable Etiology of anemia: iron def Duration of anemia treatment: chronic  Compliance with treatment: good compliance Iron supplementation side effects: no Severity of anemia: severe Fatigue: yes Decreased exercise tolerance: no  Dyspnea on exertion: no Palpitations: no Bleeding: no Pica: no   Relevant past medical, surgical, family and social history reviewed and updated as indicated. Interim medical history since our last visit reviewed. Allergies  and medications reviewed and updated.  Review of Systems  Constitutional: Negative.   Respiratory: Negative.    Cardiovascular: Negative.   Gastrointestinal: Negative.   Musculoskeletal: Negative.   Neurological: Negative.   Psychiatric/Behavioral: Negative.      Per HPI unless specifically indicated above     Objective:    BP 133/81   Pulse (!) 103   Temp 99.2 F (37.3 C) (Oral)   Wt 228 lb 12.8 oz (103.8 kg)   LMP 07/19/2021 (Exact Date)   SpO2 98%   BMI 43.23 kg/m   Wt Readings from Last 3 Encounters:  08/08/21 228 lb 12.8 oz (103.8 kg)  07/18/21 236 lb (107 kg)  05/30/21 233 lb 8 oz (105.9 kg)    Physical Exam Vitals and nursing note reviewed.  Constitutional:      General: She is not in acute distress.    Appearance: Normal appearance. She is obese. She is not ill-appearing, toxic-appearing or diaphoretic.  HENT:     Head: Normocephalic and atraumatic.     Right Ear: External ear normal.     Left Ear: External ear normal.     Nose: Nose normal.     Mouth/Throat:     Mouth: Mucous membranes are moist.     Pharynx: Oropharynx is clear.  Eyes:     General: No scleral icterus.       Right eye: No discharge.        Left eye: No discharge.     Extraocular Movements: Extraocular movements intact.     Conjunctiva/sclera: Conjunctivae normal.     Pupils: Pupils are  equal, round, and reactive to light.  Cardiovascular:     Rate and Rhythm: Normal rate and regular rhythm.     Pulses: Normal pulses.     Heart sounds: Normal heart sounds. No murmur heard.    No friction rub. No gallop.  Pulmonary:     Effort: Pulmonary effort is normal. No respiratory distress.     Breath sounds: Normal breath sounds. No stridor. No wheezing, rhonchi or rales.  Chest:     Chest wall: No tenderness.  Musculoskeletal:        General: Normal range of motion.     Cervical back: Normal range of motion and neck supple.  Skin:    General: Skin is warm and dry.     Capillary Refill:  Capillary refill takes less than 2 seconds.     Coloration: Skin is not jaundiced or pale.     Findings: No bruising, erythema, lesion or rash.     Comments: Thick yellow great toenails Multiple plantar warts bilaterally  Neurological:     General: No focal deficit present.     Mental Status: She is alert and oriented to person, place, and time. Mental status is at baseline.  Psychiatric:        Mood and Affect: Mood normal.        Behavior: Behavior normal.        Thought Content: Thought content normal.        Judgment: Judgment normal.     Results for orders placed or performed during the hospital encounter of 08/05/21  Surgical pathology  Result Value Ref Range   SURGICAL PATHOLOGY      SURGICAL PATHOLOGY CASE: [id number] PATIENT: [facility] Surgical Pathology Report     Specimen Submitted: A. Duodenum; cbx B. Stomach; cbx C. Esophagus; cbx  Clinical History: Chronic GERD     DIAGNOSIS: A.  DUODENUM; COLD BIOPSY: - DUODENAL MUCOSA WITH INTACT VILLI. - NEGATIVE FOR ACTIVE INFLAMMATION, INTRAEPITHELIAL LYMPHOCYTOSIS, AND INFECTIOUS AGENTS.  B.  STOMACH; COLD BIOPSY: - ANTRAL AND OXYNTIC MUCOSA WITHOUT PATHOLOGIC CHANGES. - NEGATIVE FOR H. PYLORI, INTESTINAL METAPLASIA, DYSPLASIA, AND MALIGNANCY.  C.  ESOPHAGUS; COLD BIOPSY: - STRATIFIED SQUAMOUS EPITHELIUM WITH PATCHY BLOOD LAKES AND BASAL HYPERPLASIA, SUGGESTIVE OF REFLUX ESOPHAGITIS. - NEGATIVE FOR INTRAEPITHELIAL EOSINOPHILS AND NEUTROPHILS. - NEGATIVE FOR DYSPLASIA AND MALIGNANCY.  GROSS DESCRIPTION: A. Labeled: Cbx duodenal for nausea vomiting Received: Formalin Collection time: 10:55 AM on 08/05/2021 Placed into formalin time: 10:55 AM on 08/05/2021 Tissue fra gment(s): 3 Size: Aggregate, 0.8 x 0.2 x 0.1 cm Description: Tan soft tissue fragments Entirely submitted in 1 cassette.  B. Labeled: Cbx stomach nausea vomiting Received: Formalin Collection time: 10:57 AM on  08/05/2021 Placed into formalin time: 10:57 AM on 08/05/2021 Tissue fragment(s): Multiple Size: Aggregate, 1.0 x 0.4 x 0.1 cm Description: Tan soft tissue fragments Entirely submitted in 1 cassette.  C. Labeled: Cbx esophagus for chronic GERD Received: Formalin Collection time: 10:59 AM on 08/05/2021 Placed into formalin time: 10:59 AM on 08/05/2021 Tissue fragment(s): Multiple Size: Aggregate, 0.9 x 0.2 x 0.1 cm Description: Pale pink soft tissue fragments Entirely submitted in 1 cassette.  CM 08/05/2021  Final Diagnosis performed by Ronald Lobo, MD.   Electronically signed 08/06/2021 8:02:12PM The electronic signature indicates that the named Attending Pathologist has evaluated the specimen Technical component performed at [facility], [address] [place], [place] [address] Lab: [phone] Dir: Jolene Schimke, MD, MMM  Professional component performed at [facility], [facility], [address]  [place], [place] [address] Lab: [phone] Dir: Beryle Quant, MD       Assessment & Plan:   Problem List Items Addressed This Visit       Respiratory   Asthma without status asthmaticus    Under good control on current regimen. Continue current regimen. Continue to monitor. Call with any concerns. Refills given.        Relevant Medications   albuterol (PROVENTIL) (2.5 MG/3ML) 0.083% nebulizer solution     Other   Depression, recurrent (HCC)    Following with psychiatry. Stable. Continue to monitor. Call with any concerns.       History of anemia    Rechecking labs today. Await results. Treat as needed.       Iron deficiency    Rechecking labs today. Await results. Treat as needed.       Relevant Orders   CBC with Differential/Platelet   Iron Binding Cap (TIBC)([facility])   Ferritin   Hypertriglyceridemia    Under good control on current regimen. Continue current regimen. Continue to monitor. Call with any concerns. Refills given. Labs drawn  today.       Relevant Medications   fenofibrate (TRICOR) 145 MG tablet   Other Relevant Orders   Comprehensive metabolic panel   Lipid Panel w/o Chol/HDL Ratio   Other Visit Diagnoses     Plantar wart of both feet    -  Primary   Referral to podiatry made today. Call with any concerns.    Relevant Orders   Ambulatory referral to Podiatry   Onychomycosis       Referral to podiatry made today. Call with any concerns.         Follow up plan: Return in about 6 months (around 02/07/2022) for physical.

## 2021-08-08 NOTE — Assessment & Plan Note (Signed)
Following with psychiatry. Stable. Continue to monitor. Call with any concerns.

## 2021-08-08 NOTE — Assessment & Plan Note (Signed)
Under good control on current regimen. Continue current regimen. Continue to monitor. Call with any concerns. Refills given.   

## 2021-08-09 LAB — LIPID PANEL W/O CHOL/HDL RATIO
Cholesterol, Total: 202 mg/dL — ABNORMAL HIGH (ref 100–199)
HDL: 56 mg/dL (ref >39)
LDL Chol Calc (NIH): 130 mg/dL — ABNORMAL HIGH (ref 0–99)
Triglycerides: 87 mg/dL (ref 0–149)
VLDL Cholesterol Cal: 16 mg/dL (ref 5–40)

## 2021-08-09 LAB — COMPREHENSIVE METABOLIC PANEL
ALT: 18 IU/L (ref 0–32)
AST: 19 IU/L (ref 0–40)
Albumin/Globulin Ratio: 1.9 (ref 1.2–2.2)
Albumin: 4.7 g/dL (ref 3.9–5.0)
Alkaline Phosphatase: 58 IU/L (ref 44–121)
BUN/Creatinine Ratio: 14 (ref 9–23)
BUN: 13 mg/dL (ref 6–20)
Bilirubin Total: 0.3 mg/dL (ref 0.0–1.2)
CO2: 20 mmol/L (ref 20–29)
Calcium: 9.9 mg/dL (ref 8.7–10.2)
Chloride: 99 mmol/L (ref 96–106)
Creatinine, Ser: 0.95 mg/dL (ref 0.57–1.00)
Globulin, Total: 2.5 g/dL (ref 1.5–4.5)
Glucose: 83 mg/dL (ref 70–99)
Potassium: 4.3 mmol/L (ref 3.5–5.2)
Sodium: 135 mmol/L (ref 134–144)
Total Protein: 7.2 g/dL (ref 6.0–8.5)
eGFR: 83 mL/min/{1.73_m2} (ref >59)

## 2021-08-09 LAB — CBC WITH DIFFERENTIAL/PLATELET
Basophils Absolute: 0.1 10*3/uL (ref 0.0–0.2)
Basos: 1 %
EOS (ABSOLUTE): 0 10*3/uL (ref 0.0–0.4)
Eos: 0 %
Hematocrit: 45.8 % (ref 34.0–46.6)
Hemoglobin: 15.5 g/dL (ref 11.1–15.9)
Immature Grans (Abs): 0 10*3/uL (ref 0.0–0.1)
Immature Granulocytes: 0 %
Lymphocytes Absolute: 3.3 10*3/uL — ABNORMAL HIGH (ref 0.7–3.1)
Lymphs: 31 %
MCH: 28.4 pg (ref 26.6–33.0)
MCHC: 33.8 g/dL (ref 31.5–35.7)
MCV: 84 fL (ref 79–97)
Monocytes Absolute: 0.6 10*3/uL (ref 0.1–0.9)
Monocytes: 6 %
Neutrophils Absolute: 6.5 10*3/uL (ref 1.4–7.0)
Neutrophils: 62 %
Platelets: 374 10*3/uL (ref 150–450)
RBC: 5.46 x10E6/uL — ABNORMAL HIGH (ref 3.77–5.28)
RDW: 13 % (ref 11.7–15.4)
WBC: 10.6 10*3/uL (ref 3.4–10.8)

## 2021-08-09 LAB — IRON AND TIBC
Iron Saturation: 15 % (ref 15–55)
Iron: 64 ug/dL (ref 27–159)
Total Iron Binding Capacity: 418 ug/dL (ref 250–450)
UIBC: 354 ug/dL (ref 131–425)

## 2021-08-09 LAB — FERRITIN: Ferritin: 19 ng/mL (ref 15–150)

## 2021-08-11 ENCOUNTER — Other Ambulatory Visit: Payer: Self-pay | Admitting: Family Medicine

## 2021-08-12 NOTE — Telephone Encounter (Signed)
Requested Prescriptions  Pending Prescriptions Disp Refills  . baclofen (LIORESAL) 10 MG tablet [Pharmacy Med Name: BACLOFEN 10MG TABLETS] 180 tablet 1    Sig: TAKE 1/2 TO 1 TABLET(5 TO 10 MG) BY MOUTH THREE TIMES DAILY     Analgesics:  Muscle Relaxants - baclofen Passed - 08/11/2021  9:56 AM      Passed - Cr in normal range and within 180 days    Creatinine, Ser  Date Value Ref Range Status  08/08/2021 0.95 0.57 - 1.00 mg/dL Final         Passed - eGFR is 30 or above and within 180 days    GFR calc Af Amer  Date Value Ref Range Status  03/26/2020 139 >59 mL/min/1.73 Final    Comment:    **In accordance with recommendations from the NKF-ASN Task force,**   [facility] is in the process of updating its eGFR calculation to the   2021 CKD-EPI creatinine equation that estimates kidney function   without a race variable.    GFR, Estimated  Date Value Ref Range Status  07/09/2020 >60 >60 mL/min Final    Comment:    (NOTE) Calculated using the CKD-EPI Creatinine Equation (2021)    eGFR  Date Value Ref Range Status  08/08/2021 83 >59 mL/min/1.73 Final         Passed - Valid encounter within last 6 months    Recent Outpatient Visits          4 days ago Plantar wart of both feet   [place], DO   2 months ago Internal hemorrhage   [place], Megan P, DO   4 months ago Nausea and vomiting, unspecified vomiting type   [facility], Megan P, DO   4 months ago Nausea and vomiting, unspecified vomiting type   [place], DO   6 months ago Routine general medical examination at a health care facility   [place], Barb Merino, DO      Future Appointments            In 6 months Wynetta Emery, Barb Merino, DO [facility], [facility]

## 2021-08-31 ENCOUNTER — Other Ambulatory Visit: Payer: Self-pay | Admitting: Family Medicine

## 2021-09-02 NOTE — Telephone Encounter (Signed)
Requested Prescriptions  Pending Prescriptions Disp Refills  . omeprazole (PRILOSEC) 40 MG capsule [Pharmacy Med Name: OMEPRAZOLE 40MG  CAPSULES] 90 capsule     Sig: TAKE 1 CAPSULE(40 MG) BY MOUTH DAILY     Gastroenterology: Proton Pump Inhibitors Passed - 08/31/2021  3:23 AM      Passed - Valid encounter within last 12 months    Recent Outpatient Visits          3 weeks ago Plantar wart of both feet   [facility] [place], Megan P, DO   3 months ago Internal hemorrhage   [facility] [place], Megan P, DO   5 months ago Nausea and vomiting, unspecified vomiting type   [facility], Megan P, DO   5 months ago Nausea and vomiting, unspecified vomiting type   [facility] [place], Megan P, DO   6 months ago Routine general medical examination at a health care facility   [facility] [place], [place], DO      Future Appointments            In 5 months [place], Laural Benes, DO [facility], [facility]

## 2021-09-10 ENCOUNTER — Other Ambulatory Visit: Payer: Self-pay | Admitting: Family Medicine

## 2021-09-10 ENCOUNTER — Encounter: Payer: Self-pay | Admitting: Oncology

## 2021-09-10 DIAGNOSIS — F3162 Bipolar disorder, current episode mixed, moderate: Secondary | ICD-10-CM

## 2021-09-11 NOTE — Telephone Encounter (Signed)
Requested Prescriptions  Pending Prescriptions Disp Refills  . traZODone (DESYREL) 50 MG tablet [Pharmacy Med Name: TRAZODONE 50MG  TABLETS] 180 tablet 2    Sig: TAKE 2 TABLETS(100 MG) BY MOUTH AT BEDTIME     Psychiatry: Antidepressants - Serotonin Modulator Passed - 09/10/2021  3:20 AM      Passed - Completed PHQ-2 or PHQ-9 in the last 360 days      Passed - Valid encounter within last 6 months    Recent Outpatient Visits          1 month ago Plantar wart of both feet   [facility] [place], Megan P, DO   3 months ago Internal hemorrhage   [facility] [place], Megan P, DO   5 months ago Nausea and vomiting, unspecified vomiting type   [facility] [place], Megan P, DO   5 months ago Nausea and vomiting, unspecified vomiting type   [facility] [place], Megan P, DO   7 months ago Routine general medical examination at a health care facility   [facility] [place], [place], DO      Future Appointments            In 5 months [place], Laural Benes, DO [facility], [facility]

## 2021-09-14 ENCOUNTER — Other Ambulatory Visit: Payer: Self-pay | Admitting: Family Medicine

## 2021-09-16 NOTE — Telephone Encounter (Signed)
Requested Prescriptions  Pending Prescriptions Disp Refills  . sucralfate (CARAFATE) 1 g tablet [Pharmacy Med Name: SUCRALFATE 1GM TABLETS] 120 tablet 2    Sig: TAKE 1 TABLET(1 GRAM) BY MOUTH FOUR TIMES DAILY AT BEDTIME AND WITH MEALS     Gastroenterology: Antiacids Passed - 09/14/2021  3:22 AM      Passed - Valid encounter within last 12 months    Recent Outpatient Visits          1 month ago Plantar wart of both feet   [facility] [place], Megan P, DO   3 months ago Internal hemorrhage   [facility] [place], Megan P, DO   5 months ago Nausea and vomiting, unspecified vomiting type   [facility], Megan P, DO   5 months ago Nausea and vomiting, unspecified vomiting type   [facility] [place], Megan P, DO   7 months ago Routine general medical examination at a health care facility   [facility] [place], [place], DO      Future Appointments            In 4 months Laural Benes, Oralia Rud, DO [facility], [facility]

## 2021-09-21 NOTE — Patient Instructions (Incomplete)
Start Claritin 10 MG daily for 2 weeks.  Rash, Adult  A rash is a change in the color of your skin. A rash can also change the way your skin feels. There are many different conditions and factors that can cause a rash. Follow these instructions at home: The goal of treatment is to stop the itching and keep the rash from spreading. Watch for any changes in your symptoms. Let your doctor know about them. Follow these instructions to help with your condition: Medicine Take or apply over-the-counter and prescription medicines only as told by your doctor. These may include medicines: To treat red or swollen skin (corticosteroid creams). To treat itching. To treat an allergy (oral antihistamines). To treat very bad symptoms (oral corticosteroids).  Skin care Put cool cloths (compresses) on the affected areas. Do not scratch or rub your skin. Avoid covering the rash. Make sure that the rash is exposed to air as much as possible. Managing itching and discomfort Avoid hot showers or baths. These can make itching worse. A cold shower may help. Try taking a bath with: Epsom salts. You can get these at your local pharmacy or grocery store. Follow the instructions on the package. Baking soda. Pour a small amount into the bath as told by your doctor. Colloidal oatmeal. You can get this at your local pharmacy or grocery store. Follow the instructions on the package. Try putting baking soda paste onto your skin. Stir water into baking soda until it gets like a paste. Try putting on a lotion that relieves itchiness (calamine lotion). Keep cool and out of the sun. Sweating and being hot can make itching worse. General instructions  Rest as needed. Drink enough fluid to keep your pee (urine) pale yellow. Wear loose-fitting clothing. Avoid scented soaps, detergents, and perfumes. Use gentle soaps, detergents, perfumes, and other cosmetic products. Avoid anything that causes your rash. Keep a journal to  help track what causes your rash. Write down: What you eat. What cosmetic products you use. What you drink. What you wear. This includes jewelry. Keep all follow-up visits as told by your doctor. This is important. Contact a doctor if: You sweat at night. You lose weight. You pee (urinate) more than normal. You pee less than normal, or you notice that your pee is a darker color than normal. You feel weak. You throw up (vomit). Your skin or the whites of your eyes look yellow (jaundice). Your skin: Tingles. Is numb. Your rash: Does not go away after a few days. Gets worse. You are: More thirsty than normal. More tired than normal. You have: New symptoms. Pain in your belly (abdomen). A fever. Watery poop (diarrhea). Get help right away if: You have a fever and your symptoms suddenly get worse. You start to feel mixed up (confused). You have a very bad headache or a stiff neck. You have very bad joint pains or stiffness. You have jerky movements that you cannot control (seizure). Your rash covers all or most of your body. The rash may or may not be painful. You have blisters that: Are on top of the rash. Grow larger. Grow together. Are painful. Are inside your nose or mouth. You have a rash that: Looks like purple pinprick-sized spots all over your body. Has a "bull's eye" or looks like a target. Is red and painful, causes your skin to peel, and is not from being in the sun too long. Summary A rash is a change in the color of your skin.  A rash can also change the way your skin feels. The goal of treatment is to stop the itching and keep the rash from spreading. Take or apply over-the-counter and prescription medicines only as told by your doctor. Contact a doctor if you have new symptoms or symptoms that get worse. Keep all follow-up visits as told by your doctor. This is important. This information is not intended to replace advice given to you by your health care  provider. Make sure you discuss any questions you have with your health care provider. Document Revised: 11/08/2020 Document Reviewed: 11/08/2020 Elsevier Patient Education  2023 [employer].

## 2021-09-24 ENCOUNTER — Encounter: Payer: Self-pay | Admitting: Nurse Practitioner

## 2021-09-24 ENCOUNTER — Ambulatory Visit: Payer: BC Managed Care – PPO | Admitting: Nurse Practitioner

## 2021-09-24 DIAGNOSIS — R21 Rash and other nonspecific skin eruption: Secondary | ICD-10-CM | POA: Diagnosis not present

## 2021-09-24 MED ORDER — PREDNISONE 10 MG PO TABS
ORAL_TABLET | ORAL | 0 refills | Status: DC
Start: 2021-09-24 — End: 2021-10-15

## 2021-09-24 MED ORDER — TRIAMCINOLONE ACETONIDE 0.1 % EX CREA: 1.0000 | TOPICAL_CREAM | Freq: Two times a day (BID) | CUTANEOUS | 2 refills | Status: DC

## 2021-09-24 NOTE — Progress Notes (Signed)
Acute Office Visit  Subjective:     Patient ID: Mariah Gonzalez, female    DOB: May 21, 1990, 31 y.o.   MRN: [id number]  Chief Complaint  Patient presents with   Rash    On bilateral legs, arms and on patients back. Pt c/o itching.    She reports noticing rash to bilateral legs, arms, and lower back on Wednesday - intermittent areas pop upping.  States they look like hives.  Benadryl worked for two hours.  Was recently sitting out in sun, since Wednesday -- almost every day.  Used her usual sunscreen, nothing new.  Has lots of stressors in life at this time.  No one else in household has this.  Rash This is a new problem. The current episode started in the past 7 days. The problem is unchanged. The affected locations include the back, left arm, right arm, left lower leg, right lower leg, left upper leg and right upper leg. The rash is characterized by blistering and itchiness (blistering from scratching she believes). She was exposed to nothing. Pertinent negatives include no cough, diarrhea, facial edema, fatigue, fever, joint pain, shortness of breath or sore throat. Past treatments include antihistamine, topical steroids and moisturizer (Aveeno and oatmeal bath). The treatment provided mild relief. Her past medical history is significant for eczema. There is no history of allergies or asthma.   Patient is in today for skin irritation to both legs.  Review of Systems  Constitutional:  Negative for fatigue and fever.  HENT:  Negative for sore throat.   Respiratory:  Negative for cough and shortness of breath.   Gastrointestinal:  Negative for diarrhea.  Musculoskeletal:  Negative for joint pain.  Skin:  Positive for rash.  Neurological: Negative.       Objective:    BP 124/85   Pulse 97   Temp 98.5 F (36.9 C) (Oral)   Wt 240 lb 1.6 oz (108.9 kg)   LMP 09/10/2021 (Exact Date)   SpO2 100%   BMI 45.37 kg/m  BP Readings from Last 3 Encounters:  09/24/21 124/85   08/08/21 133/81  08/05/21 112/64   Wt Readings from Last 3 Encounters:  09/24/21 240 lb 1.6 oz (108.9 kg)  08/08/21 228 lb 12.8 oz (103.8 kg)  07/18/21 236 lb (107 kg)   Physical Exam Vitals and nursing note reviewed.  Constitutional:      General: She is awake.     Appearance: She is well-developed and well-groomed. She is obese. She is not ill-appearing or toxic-appearing.  HENT:     Head: Normocephalic.     Right Ear: Hearing normal.     Left Ear: Hearing normal.  Eyes:     General: Lids are normal.        Right eye: No discharge.        Left eye: No discharge.     Conjunctiva/sclera: Conjunctivae normal.     Pupils: Pupils are equal, round, and reactive to light.  Neck:     Thyroid: No thyromegaly.     Vascular: No carotid bruit.  Cardiovascular:     Rate and Rhythm: Normal rate and regular rhythm.     Heart sounds: Normal heart sounds. No murmur heard.    No gallop.  Pulmonary:     Effort: Pulmonary effort is normal.     Breath sounds: Normal breath sounds.  Abdominal:     General: Bowel sounds are normal.     Palpations: Abdomen is soft. There is no hepatomegaly  or splenomegaly.  Musculoskeletal:     Cervical back: Normal range of motion and neck supple.     Right lower leg: No edema.     Left lower leg: No edema.  Skin:    General: Skin is warm and dry.     Findings: Rash present. Rash is papular.     Comments: Scattered raised areas with erythema at base in various stages of healing to bilateral legs, bilateral arms, and upper back.  No vesicles noted to newer areas and no specific pattern.  Rash areas are round and individual.  Neurological:     Mental Status: She is alert and oriented to person, place, and time.  Psychiatric:        Attention and Perception: Attention normal.        Mood and Affect: Mood normal.        Speech: Speech normal.        Behavior: Behavior normal. Behavior is cooperative.        Thought Content: Thought content normal.      No results found for any visits on 09/24/21.      Assessment & Plan:   Problem List Items Addressed This Visit       Musculoskeletal and Integument   Rash    Acute for a few days, ?sun reaction -- has been out at pool daily.  Low suspicion for bed bugs as no one else in house, including wife, have this.  Will send in Prednisone taper and Triamcinolone cream to treat.  Recommend she start Claritin 10 MG daily.  Do not scratch at areas.  Utilize oatmeal baths or soaks + Sarna lotion as needed.  Return for ongoing or worsening.       Meds ordered this encounter  Medications   predniSONE (DELTASONE) 10 MG tablet    Sig: Take 6 tablets by mouth daily for 2 days, then reduce by 1 tablet every 2 days until gone    Dispense:  42 tablet    Refill:  0   triamcinolone cream (KENALOG) 0.1 %    Sig: Apply 1 Application topically 2 (two) times daily.    Dispense:  45 g    Refill:  2    Return if symptoms worsen or fail to improve.  Marjie Skiff, NP

## 2021-09-24 NOTE — Assessment & Plan Note (Signed)
Acute for a few days, ?sun reaction -- has been out at pool daily.  Low suspicion for bed bugs as no one else in house, including wife, have this.  Will send in Prednisone taper and Triamcinolone cream to treat.  Recommend she start Claritin 10 MG daily.  Do not scratch at areas.  Utilize oatmeal baths or soaks + Sarna lotion as needed.  Return for ongoing or worsening.

## 2021-10-13 ENCOUNTER — Encounter: Payer: Self-pay | Admitting: Family Medicine

## 2021-10-15 ENCOUNTER — Encounter: Payer: Self-pay | Admitting: Family Medicine

## 2021-10-15 ENCOUNTER — Ambulatory Visit: Payer: BC Managed Care – PPO | Admitting: Family Medicine

## 2021-10-15 VITALS — BP 137/84 | HR 105 | Wt 253.0 lb

## 2021-10-15 DIAGNOSIS — L509 Urticaria, unspecified: Secondary | ICD-10-CM | POA: Diagnosis not present

## 2021-10-15 MED ORDER — MONTELUKAST SODIUM 10 MG PO TABS: 10.0000 mg | ORAL_TABLET | Freq: Every day | ORAL | 3 refills | Status: DC

## 2021-10-15 NOTE — Progress Notes (Signed)
BP 137/84   Pulse (!) 105   Wt 253 lb (114.8 kg)   LMP 09/10/2021 (Exact Date)   SpO2 98%   BMI 47.80 kg/m    Subjective:    Patient ID: Mariah Gonzalez, female    DOB: [birth year], 31 y.o.   MRN: [id number]  HPI: Mariah Gonzalez is a 31 y.o. female  Chief Complaint  Patient presents with   Urticaria    Patient states she's been having daily hives on her legs and back for a few weeks. Patient states she believes it is from mold exposure at work.    RASH Duration:  about a month  Location: generalized  Itching: yes Burning: no Redness: yes Oozing: no Scaling: yes Blisters: yes Painful: no Fevers: no Change in detergents/soaps/personal care products: no Recent illness: no Recent travel:no History of same: no Context: fluctuating Alleviating factors:  triamcinalone ointment Treatments attempted: triamcinalone ointment Shortness of breath: no  Throat/tongue swelling: no Myalgias/arthralgias: no  Relevant past medical, surgical, family and social history reviewed and updated as indicated. Interim medical history since our last visit reviewed. Allergies and medications reviewed and updated.  Review of Systems  Constitutional: Negative.   Respiratory: Negative.    Cardiovascular: Negative.   Gastrointestinal: Negative.   Skin:  Positive for rash. Negative for color change, pallor and wound.  Neurological: Negative.   Psychiatric/Behavioral: Negative.      Per HPI unless specifically indicated above     Objective:    BP 137/84   Pulse (!) 105   Wt 253 lb (114.8 kg)   LMP 09/10/2021 (Exact Date)   SpO2 98%   BMI 47.80 kg/m   Wt Readings from Last 3 Encounters:  10/15/21 253 lb (114.8 kg)  09/24/21 240 lb 1.6 oz (108.9 kg)  08/08/21 228 lb 12.8 oz (103.8 kg)    Physical Exam Vitals and nursing note reviewed.  Constitutional:      General: She is not in acute distress.    Appearance: Normal appearance. She is obese. She is not  ill-appearing, toxic-appearing or diaphoretic.  HENT:     Head: Normocephalic and atraumatic.     Right Ear: External ear normal.     Left Ear: External ear normal.     Nose: Nose normal.     Mouth/Throat:     Mouth: Mucous membranes are moist.     Pharynx: Oropharynx is clear.  Eyes:     General: No scleral icterus.       Right eye: No discharge.        Left eye: No discharge.     Extraocular Movements: Extraocular movements intact.     Conjunctiva/sclera: Conjunctivae normal.     Pupils: Pupils are equal, round, and reactive to light.  Cardiovascular:     Rate and Rhythm: Normal rate and regular rhythm.     Pulses: Normal pulses.     Heart sounds: Normal heart sounds. No murmur heard.    No friction rub. No gallop.  Pulmonary:     Effort: Pulmonary effort is normal. No respiratory distress.     Breath sounds: Normal breath sounds. No stridor. No wheezing, rhonchi or rales.  Chest:     Chest wall: No tenderness.  Musculoskeletal:        General: Normal range of motion.     Cervical back: Normal range of motion and neck supple.  Skin:    General: Skin is warm and dry.     Capillary Refill: Capillary  refill takes less than 2 seconds.     Coloration: Skin is not jaundiced or pale.     Findings: Rash (welps) present. No bruising, erythema or lesion.  Neurological:     General: No focal deficit present.     Mental Status: She is alert and oriented to person, place, and time. Mental status is at baseline.  Psychiatric:        Mood and Affect: Mood normal.        Behavior: Behavior normal.        Thought Content: Thought content normal.        Judgment: Judgment normal.     Results for orders placed or performed in visit on 08/08/21  Comprehensive metabolic panel  Result Value Ref Range   Glucose 83 70 - 99 mg/dL   BUN 13 6 - 20 mg/dL   Creatinine, Ser 0.95 0.57 - 1.00 mg/dL   eGFR 83 >59 mL/min/1.73   BUN/Creatinine Ratio 14 9 - 23   Sodium 135 134 - 144 mmol/L    Potassium 4.3 3.5 - 5.2 mmol/L   Chloride 99 96 - 106 mmol/L   CO2 20 20 - 29 mmol/L   Calcium 9.9 8.7 - 10.2 mg/dL   Total Protein 7.2 6.0 - 8.5 g/dL   Albumin 4.7 3.9 - 5.0 g/dL   Globulin, Total 2.5 1.5 - 4.5 g/dL   Albumin/Globulin Ratio 1.9 1.2 - 2.2   Bilirubin Total 0.3 0.0 - 1.2 mg/dL   Alkaline Phosphatase 58 44 - 121 IU/L   AST 19 0 - 40 IU/L   ALT 18 0 - 32 IU/L  CBC with Differential/Platelet  Result Value Ref Range   WBC 10.6 3.4 - 10.8 x10E3/uL   RBC 5.46 (H) 3.77 - 5.28 x10E6/uL   Hemoglobin 15.5 11.1 - 15.9 g/dL   Hematocrit 45.8 34.0 - 46.6 %   MCV 84 79 - 97 fL   MCH 28.4 26.6 - 33.0 pg   MCHC 33.8 31.5 - 35.7 g/dL   RDW 13.0 11.7 - 15.4 %   Platelets 374 150 - 450 x10E3/uL   Neutrophils 62 Not Estab. %   Lymphs 31 Not Estab. %   Monocytes 6 Not Estab. %   Eos 0 Not Estab. %   Basos 1 Not Estab. %   Neutrophils Absolute 6.5 1.4 - 7.0 x10E3/uL   Lymphocytes Absolute 3.3 (H) 0.7 - 3.1 x10E3/uL   Monocytes Absolute 0.6 0.1 - 0.9 x10E3/uL   EOS (ABSOLUTE) 0.0 0.0 - 0.4 x10E3/uL   Basophils Absolute 0.1 0.0 - 0.2 x10E3/uL   Immature Granulocytes 0 Not Estab. %   Immature Grans (Abs) 0.0 0.0 - 0.1 x10E3/uL  Iron Binding Cap (TIBC)([facility])  Result Value Ref Range   Total Iron Binding Capacity 418 250 - 450 ug/dL   UIBC 354 131 - 425 ug/dL   Iron 64 27 - 159 ug/dL   Iron Saturation 15 15 - 55 %  Ferritin  Result Value Ref Range   Ferritin 19 15 - 150 ng/mL  Lipid Panel w/o Chol/HDL Ratio  Result Value Ref Range   Cholesterol, Total 202 (H) 100 - 199 mg/dL   Triglycerides 87 0 - 149 mg/dL   HDL 56 >39 mg/dL   VLDL Cholesterol Cal 16 5 - 40 mg/dL   LDL Chol Calc (NIH) 130 (H) 0 - 99 mg/dL      Assessment & Plan:   Problem List Items Addressed This Visit   None Visit Diagnoses  Hives    -  Primary   Continue zytrec, add singulair, check mold RAST. Await results. Call with any concerns.    Relevant Orders   Allergen Profile, Mold         Follow up plan: Return as scheduled.

## 2021-10-17 LAB — ALLERGEN PROFILE, MOLD
Alternaria Alternata IgE: <0.1 kU/L
Aspergillus Fumigatus IgE: <0.1 kU/L
Aureobasidi Pullulans IgE: <0.1 kU/L
Candida Albicans IgE: <0.1 kU/L
Cladosporium Herbarum IgE: <0.1 kU/L
M009-IgE Fusarium proliferatum: <0.1 kU/L
M014-IgE Epicoccum purpur: <0.1 kU/L
Mucor Racemosus IgE: <0.1 kU/L
Penicillium Chrysogen IgE: <0.1 kU/L
Phoma Betae IgE: <0.1 kU/L
Setomelanomma Rostrat: <0.1 kU/L
Stemphylium Herbarum IgE: <0.1 kU/L

## 2021-11-06 ENCOUNTER — Emergency Department: Payer: BC Managed Care – PPO

## 2021-11-06 ENCOUNTER — Other Ambulatory Visit: Payer: Self-pay

## 2021-11-06 ENCOUNTER — Encounter: Payer: Self-pay | Admitting: Intensive Care

## 2021-11-06 ENCOUNTER — Emergency Department
Admission: EM | Admit: 2021-11-06 | Discharge: 2021-11-06 | Disposition: A | Discharge status: Home / Self Care | Payer: BC Managed Care – PPO | Attending: Emergency Medicine | Admitting: Emergency Medicine

## 2021-11-06 ENCOUNTER — Ambulatory Visit: Payer: Self-pay | Admitting: *Deleted

## 2021-11-06 DIAGNOSIS — R079 Chest pain, unspecified: Secondary | ICD-10-CM | POA: Diagnosis not present

## 2021-11-06 DIAGNOSIS — J45909 Unspecified asthma, uncomplicated: Secondary | ICD-10-CM | POA: Insufficient documentation

## 2021-11-06 DIAGNOSIS — R000 Tachycardia, unspecified: Secondary | ICD-10-CM | POA: Insufficient documentation

## 2021-11-06 DIAGNOSIS — R112 Nausea with vomiting, unspecified: Secondary | ICD-10-CM | POA: Insufficient documentation

## 2021-11-06 HISTORY — DX: Pure hypercholesterolemia, unspecified: E78.00

## 2021-11-06 HISTORY — DX: Essential (primary) hypertension: I10

## 2021-11-06 LAB — BASIC METABOLIC PANEL
Anion gap: 7 (ref 5–15)
BUN: 12 mg/dL (ref 6–20)
CO2: 26 mmol/L (ref 22–32)
Calcium: 10 mg/dL (ref 8.9–10.3)
Chloride: 105 mmol/L (ref 98–111)
Creatinine, Ser: 0.91 mg/dL (ref 0.44–1.00)
GFR, Estimated: >60 mL/min (ref >60)
Glucose, Bld: 98 mg/dL (ref 70–99)
Potassium: 4 mmol/L (ref 3.5–5.1)
Sodium: 138 mmol/L (ref 135–145)

## 2021-11-06 LAB — TROPONIN I (HIGH SENSITIVITY): Troponin I (High Sensitivity): <2 ng/L (ref <18)

## 2021-11-06 LAB — CBC
HCT: 45 % (ref 36.0–46.0)
Hemoglobin: 14.3 g/dL (ref 12.0–15.0)
MCH: 27.4 pg (ref 26.0–34.0)
MCHC: 31.8 g/dL (ref 30.0–36.0)
MCV: 86.4 fL (ref 80.0–100.0)
Platelets: 405 10*3/uL — ABNORMAL HIGH (ref 150–400)
RBC: 5.21 MIL/uL — ABNORMAL HIGH (ref 3.87–5.11)
RDW: 13.1 % (ref 11.5–15.5)
WBC: 8.5 10*3/uL (ref 4.0–10.5)
nRBC: 0 % (ref 0.0–0.2)

## 2021-11-06 LAB — D-DIMER, QUANTITATIVE: D-Dimer, Quant: <0.27 ug/mL-FEU (ref 0.00–0.50)

## 2021-11-06 NOTE — ED Triage Notes (Signed)
Patient c/o central chest pain that radiates around right breast that started this AM.   HX high blood pressure and cholesterol

## 2021-11-06 NOTE — Telephone Encounter (Signed)
  Chief Complaint: chest pain Symptoms: chest pain- midline into R chest- was severe- now moderate- worse with movement Frequency: started this morning Pertinent Negatives: Patient denies fever,cough Disposition: [x] ED /[] Urgent Care (no appt availability in office) / [] Appointment(In office/virtual)/ []  [facility]/ [] Home Care/ [] Refused Recommended Disposition /[]  Mobile Bus/ []  Follow-up with PCP Additional Notes: High risk compliant with personal risk factors- high BP, lipid, family hx

## 2021-11-06 NOTE — Telephone Encounter (Signed)
Reason for Disposition  [1] Chest pain lasts > 5 minutes AND [2] occurred in past 3 days (72 hours) (Exception: Feels exactly the same as previously diagnosed heartburn and has accompanying sour taste in mouth.)  Answer Assessment - Initial Assessment Questions 1. LOCATION: "Where does it hurt?"       Middle of chest 2. RADIATION: "Does the pain go anywhere else?" (e.g., into neck, jaw, arms, back)     No- right into breast 3. ONSET: "When did the chest pain begin?" (Minutes, hours or days)      1 hour 4. PATTERN: "Does the pain come and go, or has it been constant since it started?"  "Does it get worse with exertion?"      Constant, worse with exertion 5. DURATION: "How long does it last" (e.g., seconds, minutes, hours)     *No Answer* 6. SEVERITY: "How bad is the pain?"  (e.g., Scale 1-10; mild, moderate, or severe)    - MILD (1-3): doesn't interfere with normal activities     - MODERATE (4-7): interferes with normal activities or awakens from sleep    - SEVERE (8-10): excruciating pain, unable to do any normal activities       Moderate 7. CARDIAC RISK FACTORS: "Do you have any history of heart problems or risk factors for heart disease?" (e.g., angina, prior heart attack; diabetes, high blood pressure, high cholesterol, smoker, or strong family history of heart disease)     High BP, high cholesterol 8. PULMONARY RISK FACTORS: "Do you have any history of lung disease?"  (e.g., blood clots in lung, asthma, emphysema, birth control pills)       9. CAUSE: "What do you think is causing the chest pain?"     Not sure 10. OTHER SYMPTOMS: "Do you have any other symptoms?" (e.g., dizziness, nausea, vomiting, sweating, fever, difficulty breathing, cough)       Vomiting, sweating 11. PREGNANCY: "Is there any chance you are pregnant?" "When was your last menstrual period?"       *No Answer*  Protocols used: Chest Pain-A-AH

## 2021-11-06 NOTE — ED Provider Notes (Signed)
[facility] Provider Note    Event Date/Time   First MD Initiated Contact with Patient 11/06/21 1048     (approximate)   History   Chief Complaint Chest Pain   HPI Mariah Gonzalez is a 31 y.o. female, history of migraine, asthma, PTSD, GERD, social anxiety disorder, bipolar 1, iron deficiency anemia, presents emergency department for evaluation of central chest pain that started this morning.  Described as a constant, 8/10 pain with radiation into her right arm.  Additionally had episode of nausea/vomiting this morning.  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  She states that her pain is slowly beginning to improve as time goes on, however it is still there.  She does have a history of anxiety and panic attacks, however she states that this does not feel like a panic attack.  She does state that her family has a extensive history of cardiac issues.  Denies fever/chills, myalgias, recent surgeries, recent long travel, abdominal pain, dysuria, shortness of breath, back pain, rash/lesions, or dizziness/lightheadedness.  History Limitations: No limitations.        Physical Exam  Triage Vital Signs: ED Triage Vitals  Enc Vitals Group     BP 11/06/21 1029 (!) 140/90     Pulse Rate 11/06/21 1029 (!) 102     Resp 11/06/21 1029 20     Temp 11/06/21 1029 98.7 F (37.1 C)     Temp Source 11/06/21 1029 Oral     SpO2 11/06/21 1029 96 %     Weight 11/06/21 1021 250 lb (113.4 kg)     Height 11/06/21 1021 5\' 1"  (1.549 m)     Head Circumference --      Peak Flow --      Pain Score 11/06/21 1021 6     Pain Loc --      Pain Edu? --      Excl. in [place]? --     Most recent vital signs: Vitals:   11/06/21 1029  BP: (!) 140/90  Pulse: (!) 102  Resp: 20  Temp: 98.7 F (37.1 C)  SpO2: 96%    General: Awake, NAD.  Skin: Warm, dry. No rashes or lesions.  Eyes: PERRL. Conjunctivae normal.  CV: Good peripheral perfusion.  S1 and S2  present, no murmurs, rubs, or gallops. Resp: Normal effort.  Lung sounds are clear bilaterally. Abd: Soft, non-tender. No distention.  Neuro: At baseline. No gross neurological deficits.  Musculoskeletal: Normal ROM of all extremities.  Focused Exam: No chest wall tenderness.  No swelling or tenderness in the upper or lower extremities.   Physical Exam    ED Results / Procedures / Treatments  Labs (all labs ordered are listed, but only abnormal results are displayed) Labs Reviewed  CBC - Abnormal; Notable for the following components:      Result Value   RBC 5.21 (*)    Platelets 405 (*)    All other components within normal limits  BASIC METABOLIC PANEL  D-DIMER, QUANTITATIVE  POC URINE PREG, ED  TROPONIN I (HIGH SENSITIVITY)  TROPONIN I (HIGH SENSITIVITY)     EKG Sinus rhythm, rate of 103, no ST segment changes, no AV blocks, no axis deviations, normal QRS, no QT prolongation.    RADIOLOGY  ED Provider Interpretation: I personally viewed and interpreted this x-ray, no evidence of acute cardiopulmonary abnormalities.  DG Chest 2 View  Result Date: 11/06/2021 CLINICAL DATA:  Chest pain EXAM: CHEST -  2 VIEW COMPARISON:  05/03/2020 FINDINGS: There is slight breathing motion in the PA view limiting the study. As far as seen, cardiac size is within normal limits. There are no signs of pulmonary edema or focal pulmonary consolidation. There is no pleural effusion or pneumothorax. IMPRESSION: No active cardiopulmonary disease. Electronically Signed   By: Elmer Picker M.D.   On: 11/06/2021 11:08    PROCEDURES:  Critical Care performed: N/A.  Procedures    MEDICATIONS ORDERED IN ED: Medications - No data to display   IMPRESSION / MDM / [place] / ED COURSE  I reviewed the triage vital signs and the nursing notes.                              Differential diagnosis includes, but is not limited to, ACS, pericarditis, myocarditis, pulmonary embolism,  GERD, costochondritis, anxiety  ED Course Patient appears well, notably tachycardic with heart rate of 102.  Otherwise normal vitals.  Afebrile.  CBC shows no leukocytosis or anemia.  BMP shows no electrolyte abnormalities or AKI.  D-dimer negative.  Initial troponin less than 2.  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  Lab work-up is reassuring.  EKG is unremarkable, troponin less than 2.  Unlikely myocardial infarction.  D-dimer negative, unlikely pulmonary embolism.  I suspect that this is likely a benign etiology, however given her extensive family history of cardiac disease, will provide her with a referral to cardiology for further evaluation and management.  Patient is agreeable to this plan.  Will discharge.  Considered admission for this patient, but given her stable presentation and unremarkable work-up, she is unlikely to benefit from admission.  Provided the patient with anticipatory guidance, return precautions, and educational material. Encouraged the patient to return to the emergency department at any time if they begin to experience any new or worsening symptoms. Patient expressed understanding and agreed with the plan.   Patient's presentation is most consistent with acute complicated illness / injury requiring diagnostic workup.       FINAL CLINICAL IMPRESSION(S) / ED DIAGNOSES   Final diagnoses:  Chest pain, unspecified type     Rx / DC Orders   ED Discharge Orders          Ordered    Ambulatory referral to Cardiology       Comments: If you have not heard from the Cardiology office within the next 72 hours please call [phone].   11/06/21 1410             Note:  This document was prepared using Dragon voice recognition software and may include unintentional dictation errors.   Teodoro Spray, [place] 11/06/21 1412    Carrie Mew, MD 11/08/21 1911

## 2021-11-06 NOTE — ED Notes (Signed)
The pt is refusing to provide a urine for a POC because she states she is not pregnant.

## 2021-11-06 NOTE — Discharge Instructions (Addendum)
-  The cardiology office should be contacting you within the next 1 to 2 days.  If they do not, please use the contact information listed in these instructions to schedule an appointment.  -Return to the emergency department anytime if you begin to experience any new or worsening symptoms.

## 2021-11-10 ENCOUNTER — Other Ambulatory Visit: Payer: Self-pay | Admitting: Family Medicine

## 2021-11-11 NOTE — Telephone Encounter (Signed)
Requested medication (s) are due for refill today: yes  Requested medication (s) are on the active medication list: yes  Last refill:  11/05/2020 #1 with 11 RF  Future visit scheduled: 02/11/22, seen 10/15/21  Notes to clinic:  no protocol for this med, please assess.      Requested Prescriptions  Pending Prescriptions Disp [place] 100-62.5-25 MCG/ACT AEPB [Pharmacy Med Name: TRELEGY ELLIPTA 100-62.5MCG INH 30P] 60 each     Sig: INHALE 1 PUFF INTO THE LUNGS DAILY     Off-Protocol Failed - 11/10/2021  3:22 AM      Failed - Medication not assigned to a protocol, review manually.      Passed - Valid encounter within last 12 months    Recent Outpatient Visits           3 weeks ago [place], [place], DO   1 month ago [place], [place] T, NP   3 months ago Plantar wart of both feet   [place], [place], DO   5 months ago Internal hemorrhage   [place] P, DO   7 months ago Nausea and vomiting, unspecified vomiting type   [place] [facility] Valerie Roys, DO       Future Appointments             In 1 month Ellyn Hack, Leonie Green, MD [place]. [place]   In 3 months Johnson, Barb Merino, DO [facility], [facility]

## 2021-12-06 ENCOUNTER — Other Ambulatory Visit: Payer: Self-pay | Admitting: Family Medicine

## 2021-12-06 NOTE — Telephone Encounter (Signed)
Requested Prescriptions  Pending Prescriptions Disp Refills  . baclofen (LIORESAL) 10 MG tablet [Pharmacy Med Name: BACLOFEN 10MG TABLETS] 180 tablet 1    Sig: TAKE 1/2 TO 1 TABLET(5 TO 10 MG) BY MOUTH THREE TIMES DAILY     Analgesics:  Muscle Relaxants - baclofen Passed - 12/06/2021  3:22 AM      Passed - Cr in normal range and within 180 days    Creatinine, Ser  Date Value Ref Range Status  11/06/2021 0.91 0.44 - 1.00 mg/dL Final         Passed - eGFR is 30 or above and within 180 days    GFR calc Af Amer  Date Value Ref Range Status  03/26/2020 139 >59 mL/min/1.73 Final    Comment:    **In accordance with recommendations from the NKF-ASN Task force,**   [facility] is in the process of updating its eGFR calculation to the   2021 CKD-EPI creatinine equation that estimates kidney function   without a race variable.    GFR, Estimated  Date Value Ref Range Status  11/06/2021 >60 >60 mL/min Final    Comment:    (NOTE) Calculated using the CKD-EPI Creatinine Equation (2021)    eGFR  Date Value Ref Range Status  08/08/2021 83 >59 mL/min/1.73 Final         Passed - Valid encounter within last 6 months    Recent Outpatient Visits          1 month ago [place], [place], DO   2 months ago [place], [place] T, NP   4 months ago Plantar wart of both feet   [facility] [place], [place], DO   6 months ago Internal hemorrhage   [place], Megan P, DO   8 months ago Nausea and vomiting, unspecified vomiting type   [place], Barb Merino, DO      Future Appointments            In 3 days Wynetta Emery, Barb Merino, DO [facility], [place]   In 2 weeks Ellyn Hack, Leonie Green, MD [place]. [place]   In 2 months Wynetta Emery, Barb Merino, DO [facility], [facility]

## 2021-12-09 ENCOUNTER — Encounter: Payer: Self-pay | Admitting: Family Medicine

## 2021-12-09 ENCOUNTER — Ambulatory Visit: Payer: BC Managed Care – PPO | Admitting: Family Medicine

## 2021-12-09 VITALS — BP 135/88 | HR 105 | Temp 99.2°F | Wt 250.4 lb

## 2021-12-09 DIAGNOSIS — J0100 Acute maxillary sinusitis, unspecified: Secondary | ICD-10-CM

## 2021-12-09 DIAGNOSIS — R509 Fever, unspecified: Secondary | ICD-10-CM | POA: Diagnosis not present

## 2021-12-09 MED ORDER — PREDNISONE 10 MG PO TABS: ORAL_TABLET | ORAL | 0 refills | Status: DC

## 2021-12-09 MED ORDER — TRIAMCINOLONE ACETONIDE 40 MG/ML IJ SUSP
40.0000 mg | Freq: Once | INTRAMUSCULAR | Status: AC
  Administered 2021-12-09: 40 mg via INTRAMUSCULAR

## 2021-12-09 MED ORDER — DOXYCYCLINE HYCLATE 100 MG PO TABS: 100.0000 mg | ORAL_TABLET | Freq: Two times a day (BID) | ORAL | 0 refills | Status: DC

## 2021-12-09 NOTE — Progress Notes (Signed)
BP 135/88   Pulse (!) 105   Temp 99.2 F (37.3 C)   Wt 250 lb 6.4 oz (113.6 kg)   SpO2 97%   BMI 47.31 kg/m    Subjective:    Patient ID: Mariah Gonzalez, female    DOB: [birth year], 31 y.o.   MRN: [id number]  HPI: Mariah Gonzalez is a 31 y.o. female  Chief Complaint  Patient presents with   URI    Patient states she has been coughing and has congestion for 2 weeks. Has a scratchy throat and runny nose. Patient states she had a low grade fever for the first 3 days. Patient states she tested twice for COVID last week and It was negative.    UPPER RESPIRATORY TRACT INFECTION Duration: 2 weeks Worst symptom: cough and congestion Fever: not in about a week Cough: yes Shortness of breath: yes Wheezing: yes Chest pain: no Chest tightness: no Chest congestion: yes Nasal congestion: yes Runny nose: yes Post nasal drip: yes Sneezing: no Sore throat: yes Swollen glands: no Sinus pressure: no Headache: no Face pain: no Toothache: no Ear pain: no  Ear pressure: no  Eyes red/itching:yes Eye drainage/crusting: no  Vomiting: no Rash: no Fatigue: yes Sick contacts: yes Strep contacts: no  Context: stable Recurrent sinusitis: no Relief with OTC cold/cough medications: no  Treatments attempted: cold/sinus   Relevant past medical, surgical, family and social history reviewed and updated as indicated. Interim medical history since our last visit reviewed. Allergies and medications reviewed and updated.  Review of Systems  Constitutional:  Positive for fatigue and fever. Negative for activity change, appetite change, chills, diaphoresis and unexpected weight change.  HENT:  Positive for congestion, postnasal drip, rhinorrhea, sinus pressure and sinus pain. Negative for dental problem, drooling, ear discharge, ear pain, facial swelling, hearing loss, mouth sores, nosebleeds, sneezing, sore throat, tinnitus, trouble swallowing and voice change.   Eyes:  Negative.   Respiratory:  Positive for cough, shortness of breath and wheezing. Negative for apnea, choking, chest tightness and stridor.   Cardiovascular: Negative.   Gastrointestinal: Negative.   Musculoskeletal: Negative.   Psychiatric/Behavioral: Negative.      Per HPI unless specifically indicated above     Objective:    BP 135/88   Pulse (!) 105   Temp 99.2 F (37.3 C)   Wt 250 lb 6.4 oz (113.6 kg)   SpO2 97%   BMI 47.31 kg/m   Wt Readings from Last 3 Encounters:  12/09/21 250 lb 6.4 oz (113.6 kg)  11/06/21 250 lb (113.4 kg)  10/15/21 253 lb (114.8 kg)    Physical Exam Vitals and nursing note reviewed.  Constitutional:      General: She is not in acute distress.    Appearance: Normal appearance. She is obese. She is not ill-appearing, toxic-appearing or diaphoretic.  HENT:     Head: Normocephalic and atraumatic.     Right Ear: Tympanic membrane, ear canal and external ear normal.     Left Ear: Tympanic membrane, ear canal and external ear normal.     Nose: Congestion and rhinorrhea present.     Mouth/Throat:     Mouth: Mucous membranes are moist.     Pharynx: Oropharynx is clear. No oropharyngeal exudate or posterior oropharyngeal erythema.  Eyes:     General: No scleral icterus.       Right eye: No discharge.        Left eye: No discharge.     Extraocular Movements: Extraocular movements  intact.     Conjunctiva/sclera: Conjunctivae normal.     Pupils: Pupils are equal, round, and reactive to light.  Cardiovascular:     Rate and Rhythm: Normal rate and regular rhythm.     Pulses: Normal pulses.     Heart sounds: Normal heart sounds. No murmur heard.    No friction rub. No gallop.  Pulmonary:     Effort: Pulmonary effort is normal. No respiratory distress.     Breath sounds: No stridor. Wheezing and rhonchi present. No rales.  Chest:     Chest wall: No tenderness.  Musculoskeletal:        General: Normal range of motion.     Cervical back: Normal range  of motion and neck supple.  Skin:    General: Skin is warm and dry.     Capillary Refill: Capillary refill takes less than 2 seconds.     Coloration: Skin is not jaundiced or pale.     Findings: No bruising, erythema, lesion or rash.  Neurological:     General: No focal deficit present.     Mental Status: She is alert and oriented to person, place, and time. Mental status is at baseline.  Psychiatric:        Mood and Affect: Mood normal.        Behavior: Behavior normal.        Thought Content: Thought content normal.        Judgment: Judgment normal.     Results for orders placed or performed during the hospital encounter of 11/06/21  Basic metabolic panel  Result Value Ref Range   Sodium 138 135 - 145 mmol/L   Potassium 4.0 3.5 - 5.1 mmol/L   Chloride 105 98 - 111 mmol/L   CO2 26 22 - 32 mmol/L   Glucose, Bld 98 70 - 99 mg/dL   BUN 12 6 - 20 mg/dL   Creatinine, Ser 2.03 0.44 - 1.00 mg/dL   Calcium 55.9 8.9 - 74.1 mg/dL   GFR, Estimated >63 >84 mL/min   Anion gap 7 5 - 15  CBC  Result Value Ref Range   WBC 8.5 4.0 - 10.5 K/uL   RBC 5.21 (H) 3.87 - 5.11 MIL/uL   Hemoglobin 14.3 12.0 - 15.0 g/dL   HCT 53.6 46.8 - 03.2 %   MCV 86.4 80.0 - 100.0 fL   MCH 27.4 26.0 - 34.0 pg   MCHC 31.8 30.0 - 36.0 g/dL   RDW 12.2 48.2 - 50.0 %   Platelets 405 (H) 150 - 400 K/uL   nRBC 0.0 0.0 - 0.2 %  D-dimer, quantitative  Result Value Ref Range   D-Dimer, Quant <0.27 0.00 - 0.50 ug/mL-FEU  Troponin I (High Sensitivity)  Result Value Ref Range   Troponin I (High Sensitivity) <2 <18 ng/L      Assessment & Plan:   Problem List Items Addressed This Visit   None Visit Diagnoses     Acute non-recurrent maxillary sinusitis    -  Primary   Will treat with prednisone, triamcinalone and doxycycline. Call if not getting better or getting worse. Recheck lungs 2 weeks.    Relevant Medications   triamcinolone acetonide (KENALOG-40) injection 40 mg   doxycycline (VIBRA-TABS) 100 MG tablet    predniSONE (DELTASONE) 10 MG tablet   Fever, unspecified fever cause       Strep and flu negative. Await COVID. Sinusitis given bimodal symptoms. See treatment above.   Relevant Orders   Novel Coronavirus,  NAA ([facility])   Rapid Strep screen([facility])   Influenza A & B (STAT)        Follow up plan: Return in about 2 weeks (around 12/23/2021).

## 2021-12-10 LAB — NOVEL CORONAVIRUS, NAA: SARS-CoV-2, NAA: NOT DETECTED

## 2021-12-12 LAB — RAPID STREP SCREEN (MED CTR MEBANE ONLY): Strep Gp A Ag, IA W/Reflex: NEGATIVE

## 2021-12-12 LAB — CULTURE, GROUP A STREP: Strep A Culture: NEGATIVE

## 2021-12-12 LAB — VERITOR FLU A/B WAIVED
Influenza A: NEGATIVE
Influenza B: NEGATIVE

## 2021-12-13 ENCOUNTER — Ambulatory Visit: Payer: BC Managed Care – PPO | Admitting: Family Medicine

## 2021-12-24 ENCOUNTER — Ambulatory Visit: Payer: BC Managed Care – PPO | Admitting: Family Medicine

## 2021-12-24 ENCOUNTER — Encounter: Payer: Self-pay | Admitting: Family Medicine

## 2021-12-24 VITALS — BP 134/93 | HR 105 | Temp 99.1°F | Wt 248.0 lb

## 2021-12-24 DIAGNOSIS — R509 Fever, unspecified: Secondary | ICD-10-CM

## 2021-12-24 DIAGNOSIS — J189 Pneumonia, unspecified organism: Secondary | ICD-10-CM

## 2021-12-24 DIAGNOSIS — J4541 Moderate persistent asthma with (acute) exacerbation: Secondary | ICD-10-CM | POA: Diagnosis not present

## 2021-12-24 LAB — VERITOR FLU A/B WAIVED
Influenza A: NEGATIVE
Influenza B: NEGATIVE

## 2021-12-24 MED ORDER — PREDNISONE 10 MG PO TABS: ORAL_TABLET | ORAL | 0 refills | Status: DC

## 2021-12-24 MED ORDER — AZITHROMYCIN 250 MG PO TABS: ORAL_TABLET | ORAL | 0 refills | Status: AC

## 2021-12-24 MED ORDER — TRIAMCINOLONE ACETONIDE 40 MG/ML IJ SUSP
40.0000 mg | Freq: Once | INTRAMUSCULAR | Status: AC
  Administered 2021-12-24: 40 mg via INTRAMUSCULAR

## 2021-12-24 NOTE — Progress Notes (Signed)
BP (!) 134/93   Pulse (!) 105   Temp 99.1 F (37.3 C)   Wt 248 lb (112.5 kg)   SpO2 99%   BMI 46.86 kg/m    Subjective:    Patient ID: Mariah Gonzalez, female    DOB: [birth year], 31 y.o.   MRN: [id number]  HPI: Mariah Gonzalez is a 31 y.o. female  Chief Complaint  Patient presents with   Cough    Patient here to follow up on cough, states cough got worse and is wheezing more and short of breath. Patient had a fever on Friday night of 101.   UPPER RESPIRATORY TRACT INFECTION- got better for about 3-4 days, then started to get worse again Duration: 4 days Worst symptom: fever, cough Fever: yes Cough: yes Shortness of breath: yes Wheezing: yes Chest pain: yes, with cough Chest tightness: yes Chest congestion: yes Nasal congestion: yes Runny nose: yes Post nasal drip: yes Sneezing: no Sore throat: no Swollen glands: no Sinus pressure: no Headache: yes Face pain: no Toothache: no Ear pain: no  Ear pressure: no  Eyes red/itching:no Eye drainage/crusting: no  Vomiting: no Rash: no Fatigue: yes Sick contacts: yes Strep contacts: no  Context: worse Recurrent sinusitis: no Relief with OTC cold/cough medications: no  Treatments attempted: inhalers, cold medicine   Relevant past medical, surgical, family and social history reviewed and updated as indicated. Interim medical history since our last visit reviewed. Allergies and medications reviewed and updated.  Review of Systems  Constitutional: Negative.   HENT:  Positive for congestion and postnasal drip. Negative for dental problem, drooling, ear discharge, ear pain, facial swelling, hearing loss, mouth sores, nosebleeds, rhinorrhea, sinus pressure, sinus pain, sneezing, sore throat, tinnitus, trouble swallowing and voice change.   Respiratory:  Positive for cough, chest tightness, shortness of breath and wheezing. Negative for apnea, choking and stridor.   Cardiovascular: Negative.    Gastrointestinal: Negative.   Musculoskeletal: Negative.   Neurological: Negative.   Psychiatric/Behavioral: Negative.      Per HPI unless specifically indicated above     Objective:    BP (!) 134/93   Pulse (!) 105   Temp 99.1 F (37.3 C)   Wt 248 lb (112.5 kg)   SpO2 99%   BMI 46.86 kg/m   Wt Readings from Last 3 Encounters:  12/24/21 248 lb (112.5 kg)  12/09/21 250 lb 6.4 oz (113.6 kg)  11/06/21 250 lb (113.4 kg)    Physical Exam Vitals and nursing note reviewed.  Constitutional:      General: She is not in acute distress.    Appearance: Normal appearance. She is not ill-appearing, toxic-appearing or diaphoretic.  HENT:     Head: Normocephalic and atraumatic.     Right Ear: Tympanic membrane, ear canal and external ear normal.     Left Ear: Tympanic membrane, ear canal and external ear normal.     Nose: Nose normal.     Mouth/Throat:     Mouth: Mucous membranes are moist.     Pharynx: Oropharynx is clear.  Eyes:     General: No scleral icterus.       Right eye: No discharge.        Left eye: No discharge.     Extraocular Movements: Extraocular movements intact.     Conjunctiva/sclera: Conjunctivae normal.     Pupils: Pupils are equal, round, and reactive to light.  Cardiovascular:     Rate and Rhythm: Normal rate and regular rhythm.  Pulses: Normal pulses.     Heart sounds: Normal heart sounds. No murmur heard.    No friction rub. No gallop.  Pulmonary:     Effort: Pulmonary effort is normal. No respiratory distress.     Breath sounds: No stridor. Wheezing and rhonchi present. No rales.  Chest:     Chest wall: No tenderness.  Musculoskeletal:        General: Normal range of motion.     Cervical back: Normal range of motion and neck supple.  Skin:    General: Skin is warm and dry.     Capillary Refill: Capillary refill takes less than 2 seconds.     Coloration: Skin is not jaundiced or pale.     Findings: No bruising, erythema, lesion or rash.   Neurological:     General: No focal deficit present.     Mental Status: She is alert and oriented to person, place, and time. Mental status is at baseline.  Psychiatric:        Mood and Affect: Mood normal.        Behavior: Behavior normal.        Thought Content: Thought content normal.        Judgment: Judgment normal.     Results for orders placed or performed in visit on 12/09/21  Novel Coronavirus, NAA ([facility])   Specimen: Nasopharyngeal(NP) swabs in vial transport medium  Result Value Ref Range   SARS-CoV-2, NAA Not Detected Not Detected  Rapid Strep screen([facility])   Specimen: Other   Other  Result Value Ref Range   Strep Gp A Ag, IA W/Reflex Negative Negative  Culture, Group A Strep   Other  Result Value Ref Range   Strep A Culture Negative   Influenza A & B (STAT)  Result Value Ref Range   Influenza A Negative Negative   Influenza B Negative Negative      Assessment & Plan:   Problem List Items Addressed This Visit       Respiratory   Asthma without status asthmaticus - Primary   Relevant Medications   predniSONE (DELTASONE) 10 MG tablet   Other Relevant Orders   Ambulatory referral to Pulmonology   Other Visit Diagnoses     Atypical pneumonia       Will treat with azithromycin and prednisone if needed.   Relevant Medications   azithromycin (ZITHROMAX) 250 MG tablet   Fever, unspecified fever cause       Will check flu and COVID. Await results.Flu negative.   Relevant Orders   Novel Coronavirus, NAA ([facility])   Veritor Flu A/B Waived        Follow up plan: Return in about 2 weeks (around 01/07/2022).

## 2021-12-26 ENCOUNTER — Encounter: Payer: Self-pay | Admitting: Cardiology

## 2021-12-26 ENCOUNTER — Ambulatory Visit: Payer: BC Managed Care – PPO

## 2021-12-26 ENCOUNTER — Encounter: Payer: Self-pay | Admitting: *Deleted

## 2021-12-26 ENCOUNTER — Ambulatory Visit: Payer: BC Managed Care – PPO | Attending: Cardiology | Admitting: Cardiology

## 2021-12-26 VITALS — BP 144/104 | HR 112 | Ht 61.5 in | Wt 245.0 lb

## 2021-12-26 DIAGNOSIS — E781 Pure hyperglyceridemia: Secondary | ICD-10-CM | POA: Diagnosis not present

## 2021-12-26 DIAGNOSIS — F17210 Nicotine dependence, cigarettes, uncomplicated: Secondary | ICD-10-CM | POA: Diagnosis not present

## 2021-12-26 DIAGNOSIS — R000 Tachycardia, unspecified: Secondary | ICD-10-CM | POA: Diagnosis not present

## 2021-12-26 DIAGNOSIS — F4010 Social phobia, unspecified: Secondary | ICD-10-CM

## 2021-12-26 DIAGNOSIS — R079 Chest pain, unspecified: Secondary | ICD-10-CM | POA: Diagnosis not present

## 2021-12-26 DIAGNOSIS — Z8249 Family history of ischemic heart disease and other diseases of the circulatory system: Secondary | ICD-10-CM

## 2021-12-26 LAB — NOVEL CORONAVIRUS, NAA: SARS-CoV-2, NAA: NOT DETECTED

## 2021-12-26 NOTE — Progress Notes (Signed)
Primary Care Provider: Valerie Roys, DO [place] Cardiologist: None Electrophysiologist: None  Clinic Note: Chief Complaint  Patient presents with   Chest Pain    Patient states that she been having chest pain a month ago. Patient went to the ER. Patient states that she has the chest pain off and on, but goes away when she takes Adderall. Meds reviewed with patient.     ===================================  ASSESSMENT/PLAN   Problem List Items Addressed This Visit       Cardiology Problems   Hypertriglyceridemia (Chronic)    Triglycerides are pretty well controlled with fenofibrate.      Relevant Orders   EKG 12-Lead (Completed)     Other   Chest pain of uncertain etiology - Primary    Unusual chest pain symptoms.  I think this is probably related to her URI and even potentially costochondritis.  She does not focal pain on palpation, but given the association with stress, and family history of CAD, will evaluate for ischemia.  Plan: Allow her to recover from her current URI and we will plan for a GXT to be performed in early December.      Relevant Orders   Exercise Tolerance Test   Cardiac Stress Test: Informed Consent Details: Physician/Practitioner Attestation; Transcribe to consent form and obtain patient signature   Family history of coronary artery disease    Family history reviewed and updated. She is too young for Coronary Calcium Score.    Checking GXT to exclude ischemia although I do not think her chest pain is cardiac in nature.  This is more for reassurance purposes.  Given her anxiety.  With her family history, would probably want to start screening for an LDL closer to 70.  She is not on statin, but is on fenofibrate.  Would add to be very judicious in use of probably rosuvastatin.  This will be determined based on the results of her GXT.      Relevant Orders   Exercise Tolerance Test   Cardiac Stress Test: Informed Consent Details:  Physician/Practitioner Attestation; Transcribe to consent form and obtain patient signature   Nicotine dependence, cigarettes, uncomplicated (Chronic)    Discussed importance smoking cessation.  Major risk factors that she has other than her family history are smoking obesity and her dyslipidemia.  Smoking is probably the worst.  Especially now in light of all of her URI symptoms.  She knows she needs to quit, just does not think she is ready to do so yet.  Lots of stress.      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  Also having palpitations, which could be exacerbated by Adderall.      Tachycardia (Chronic)    She does not really feel her heart rate that fast.  But she does always have fast heart rates when she goes places.  The question is is this true inappropriate sinus tachycardia or is it just related to stress and anxiety.  She is also on antibiotics and steroids.  Plan: Check Zio patch monitor.      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  By report, has a "extensive family history of cardiac disease "-family history updated..  Recent Hospitalizations:  08/05/2021: GI procedures: EGD 11/06/2021: ER visit for chest pain.  Constant 8 out of 10  chest pain radiating to the right arm.  Some associated nausea and vomiting.  Was improving upon arrival to ER.  Lab work reassuring with normal D-dimer and hs Troponin<2.  EKG benign.  However, due to family history of CAD, she is referred to cardiology.  Mariah Gonzalez was seen by Dr. Wynetta Emery for her second hospital/ER follow-up on 12/24/2021 with 3 to 4 days of upper respiratory tract infection symptoms-cough fever lasting 4 days.  Head congestion postnasal drip.  Started on Zithromax and prednisone taper.  She had actually been seen 2 weeks prior to that with similar symptoms.  At that time was treated with doxycycline and prednisone.   Reviewed  CV studies:    The following studies were reviewed today: (if available, images/films reviewed: From Epic Chart or Care Everywhere) None:  Interval History:   Mariah Gonzalez presents here today wearing a facemask.  She is on day 3 of 5 azithromycin and her second prednisone taper.  She states that she has had off-and-on chest pain for the last few weeks maybe couple times a day or sometimes not at all.  She says that she feels it may be related to stress.  Symptoms can last anywhere from 5 minutes to an hour and are not necessarily associated with any activity.  She says that they do not necessarily associate with any particular physical activity but may be associated with stress.  She is pretty sedentary does not do much exercise and has therefore gained quite a bit of weight.  Is quite deconditioned and does have some exertional dyspnea.  But this is also exacerbated by her recurrent respiratory symptoms.  She has had a lot of stress going on with nonwork issues.  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 all of which is quite stressful to her because the time consumption and when she is not feeling well is even more difficult.  She is very concerned because of  her family history.  She said 2 half brothers with cardiovascular issues-1 with an MI in mom with a stroke.  She says her father had 2 heart attacks in her paternal grandfather had heart attack at age 28.    She has chronic hypertriglyceridemia which is probably related to her Abilify--pretty well controlled with Tricor.  She thinks her blood pressures are running a little high of late because she has been on prednisone tapers and feels like she is getting bloated with weight gain and volume overload.  She says she feels swelling in her hands and feet as well as face.  However she is not really noticing any PND or orthopnea symptoms.  Just that laying down with her postnasal drip makes it hard to breathe.  She feels intermittent skipped beats but cannot tell if it is fast heart rates or skipped beats.  Does not sound like it is  a fast irregular heart rhythm but she does get lightheaded and dizzy with it.  CV Review of Symptoms (Summary): positive for - chest pain, dyspnea on exertion, irregular heartbeat, palpitations, shortness of breath, and probably more related to URI symptoms, it is fatigue and feels hand and face/feet puffiness. negative for - loss of consciousness, orthopnea, paroxysmal nocturnal dyspnea, or syncope or near syncope, TIA or hours fugax, claudication  REVIEWED OF SYSTEMS   Review of Systems  Constitutional:  Positive for malaise/fatigue (Does not have a lot of energy.  Seems somewhat sedentary. => A little worn out with her URI.). Negative for chills, diaphoresis, fever and weight loss.       Just getting over a URI-completing the Z-Pak for walking pneumonia.  Has had 2 prednisone Dosepaks.  HENT:  Positive for congestion and sinus pain. Negative for nosebleeds.   Respiratory:  Positive for cough (Getting better with Z-Pak and Dosepak), shortness of breath and wheezing.   Cardiovascular:        Per HPI  Gastrointestinal:  Negative for abdominal pain, blood in stool and melena.   Genitourinary:  Negative for dysuria and hematuria.  Musculoskeletal:  Positive for myalgias. Negative for falls and joint pain.  Neurological:  Positive for dizziness. Negative for focal weakness, weakness and headaches.  Endo/Heme/Allergies:  Does not bruise/bleed easily.  Psychiatric/Behavioral:  Negative for depression and memory loss. The patient is nervous/anxious.     I have reviewed and (if needed) personally updated the patient's problem list, medications, allergies, past medical and surgical history, social and family history.   PAST MEDICAL HISTORY   Past Medical History:  Diagnosis Date   ADHD    Anxiety    Asthma    Depressed    DUB (dysfunctional uterine bleeding)    Dysfunctional uterine bleeding    GERD (gastroesophageal reflux disease)    High cholesterol    History of COVID-19 01/24/2021   Hypertension    IBS (irritable bowel syndrome)    IDA (iron deficiency anemia)    LGSIL on Pap smear of cervix    Migraine without aura and without status migrainosus, not intractable    Pelvic pain in female     PAST SURGICAL HISTORY   Past Surgical History:  Procedure Laterality Date   debridement of rught eye     ESOPHAGOGASTRODUODENOSCOPY (EGD) WITH PROPOFOL N/A 08/05/2021   Procedure: ESOPHAGOGASTRODUODENOSCOPY (EGD) WITH PROPOFOL;  Surgeon: Lin Landsman, MD;  Location: [facility] END[facility];  Service: Gastroenterology;  Laterality: N/A;   gum grafting     TONSILLECTOMY Bilateral 07/03/2020   Procedure: TONSILLECTOMY;  Surgeon: Clyde Canterbury, MD;  Location: [place];  Service: ENT;  Laterality: Bilateral;   WISDOM TOOTH EXTRACTION      Immunization History  Administered Date(s) Administered   Dtap, Unspecified 09/04/1992, 11/05/1992, 03/13/1993, 01/06/1994, 06/22/1997   HPV 9-valent 01/31/2020   HPV Quadrivalent 11/10/2006, 02/02/2007   Hepatitis A, Ped/Adol-2 Dose 09/19/2004, 12/05/2005   Hepatitis B 09/04/1992, 11/05/1992, 04/19/1993, 06/22/1997    IPV 09/04/1992, 11/05/1992, 01/06/1994, 06/22/1997   Influenza, Seasonal, Injecte, Preservative Fre 12/05/2005   MMR 07/04/1993, 06/22/1997   PFIZER(Purple Top)SARS-COV-2 Vaccination 04/11/2019, 05/09/2019, 12/16/2019   Pneumococcal Polysaccharide-23 09/11/2015   Tdap 09/19/2004, 11/03/2014   Varicella 06/22/1997, 11/10/2006    MEDICATIONS/ALLERGIES   Current Meds  Medication Sig   albuterol (PROVENTIL) (2.5 MG/3ML) 0.083% nebulizer solution Take 3 mLs (2.5 mg total) by nebulization every 6 (six) hours as needed for wheezing or shortness of breath.   albuterol (VENTOLIN  HFA) 108 (90 Base) MCG/ACT inhaler INHALE 2 PUFFS INTO THE LUNGS EVERY 6 HOURS AS NEEDED FOR WHEEZING OR SHORTNESS OF BREATH   amphetamine-dextroamphetamine (ADDERALL XR) 15 MG 24 hr capsule Take by mouth every morning.   ARIPiprazole (ABILIFY) 10 MG tablet Take 10 mg by mouth daily.   azithromycin (ZITHROMAX) 250 MG tablet Take 2 tablets on day 1, then 1 tablet daily on days 2 through 5   baclofen (LIORESAL) 10 MG tablet TAKE 1/2 TO 1 TABLET(5 TO 10 MG) BY MOUTH THREE TIMES DAILY   cyclobenzaprine (FLEXERIL) 10 MG tablet Take 1 tablet (10 mg total) by mouth at bedtime.   dextroamphetamine (DEXTROSTAT) 5 MG tablet Take by mouth.   fenofibrate (TRICOR) 145 MG tablet Take 1 tablet (145 mg total) by mouth daily.   gabapentin (NEURONTIN) 100 MG capsule Take 200 mg by mouth at bedtime.   hydrocortisone cream 1 % Apply 1 application. topically 2 (two) times daily.   lamoTRIgine (LAMICTAL) 25 MG tablet TAKE 2 TABLETS BY MOUTH EVERY DAY. TO BE COMBINED WITH 200MG EVERY DAY AT BEDTIME   lidocaine (XYLOCAINE) 2 % solution SMARTSIG:Milliliter(s) Topical Daily PRN   LORazepam (ATIVAN) 0.5 MG tablet Take 1 tablet (0.5 mg total) by mouth as directed. Take 1 tablet 1-2 times a day as needed for severe anxiety attacks only - please limit use   MELATONIN PO Take by mouth.   montelukast (SINGULAIR) 10 MG tablet Take 1 tablet (10 mg  total) by mouth at bedtime.   omeprazole (PRILOSEC) 40 MG capsule TAKE 1 CAPSULE(40 MG) BY MOUTH DAILY   ondansetron (ZOFRAN) 4 MG tablet Take 1 tablet (4 mg total) by mouth every 8 (eight) hours as needed for nausea or vomiting.   predniSONE (DELTASONE) 10 MG tablet 6 tabs days 1 and 2, 5 tabs days 3 and 4, decrease by 1 every other day until gone.   promethazine (PHENERGAN) 25 MG suppository Place 1 suppository (25 mg total) rectally every 6 (six) hours as needed for nausea or vomiting.   sucralfate (CARAFATE) 1 g tablet TAKE 1 TABLET(1 GRAM) BY MOUTH FOUR TIMES DAILY AT BEDTIME AND WITH MEALS   traZODone (DESYREL) 50 MG tablet TAKE 2 TABLETS(100 MG) BY MOUTH AT BEDTIME   TRELEGY ELLIPTA 100-62.5-25 MCG/ACT AEPB INHALE 1 PUFF INTO THE LUNGS DAILY   triamcinolone cream (KENALOG) 0.1 % Apply 1 Application topically 2 (two) times daily.    Allergies  Allergen Reactions   Eggs Or Egg-Derived Products Diarrhea    SOCIAL HISTORY/FAMILY HISTORY   Reviewed in Epic:   Social History   Tobacco Use   Smoking status: Every Day    Packs/day: 0.50    Years: 2.00    Total pack years: 1.00    Types: Cigarettes    Passive exposure: Never   Smokeless tobacco: Never  Vaping Use   Vaping Use: Never used  Substance Use Topics   Alcohol use: Yes    Comment: occasionally   Drug use: Yes    Types: Marijuana, Hydromorphone    Comment: Delta 8   Social History   Social History Narrative   Not on file   Family History  Problem Relation Age of Onset   Hyperlipidemia Mother    Hypertension Mother    Depression Mother    Hyperlipidemia Father    Hypertension Father    Mental illness Father    Heart attack Father 63       Had 2 heart attacks   Stroke Father  Coronary artery disease Father    Diabetes Mellitus II Father    Hypertension Brother    Diabetes Paternal Grandfather    Heart attack Paternal Grandfather 100   Breast cancer Maternal Aunt    Breast cancer Maternal Aunt     Cancer Maternal Uncle    Breast cancer Paternal Aunt    Hearing loss Paternal Aunt    Cancer Paternal Uncle    Hearing loss Paternal Uncle    Diabetes Paternal Uncle    Heart attack Half-Brother 5   Coronary artery disease Half-Brother 77       CABG x4   Stroke Half-Brother 42    OBJCTIVE -PE, EKG, labs   Wt Readings from Last 3 Encounters:  12/26/21 245 lb (111.1 kg)  12/24/21 248 lb (112.5 kg)  12/09/21 250 lb 6.4 oz (113.6 kg)    Physical Exam: BP (!) 144/104 (BP Location: Right Arm)   Pulse (!) 112   Ht 5' 1.5" (1.562 m)   Wt 245 lb (111.1 kg)   BMI 45.54 kg/m  Physical Exam Constitutional:      General: She is not in acute distress.    Appearance: She is not toxic-appearing.     Comments: Morbidly obese.  Well-groomed.  HENT:     Head: Normocephalic and atraumatic.  Eyes:     Extraocular Movements: Extraocular movements intact.     Pupils: Pupils are equal, round, and reactive to light.     Comments: Obese, unable to assess HSM  Neck:     Vascular: No carotid bruit or JVD.  Cardiovascular:     Rate and Rhythm: Regular rhythm. Tachycardia present. No extrasystoles are present.    Chest Wall: PMI is not displaced (Unable to assess).     Pulses: Normal pulses.     Heart sounds: S1 normal and S2 normal. Heart sounds are distant.     No friction rub. No S4 sounds.  Pulmonary:     Effort: Pulmonary effort is normal. No respiratory distress.     Breath sounds: Normal breath sounds. No wheezing, rhonchi or rales.  Chest:     Chest wall: Tenderness (Point tenderness at about the second and third ribs actually more prominent on the right than the left.) present.  Abdominal:     General: Abdomen is flat. Bowel sounds are normal. There is no distension.     Palpations: Abdomen is soft. There is no mass.     Tenderness: There is no abdominal tenderness. There is no guarding.  Musculoskeletal:        General: No swelling. Normal range of motion.     Cervical back:  Normal range of motion and neck supple.  Skin:    General: Skin is warm and dry.     Comments: Facial flushing  Neurological:     General: No focal deficit present.     Mental Status: She is alert and oriented to person, place, and time.     Gait: Gait normal.  Psychiatric:        Mood and Affect: Mood normal.        Behavior: Behavior normal.        Thought Content: Thought content normal.        Judgment: Judgment normal.     Comments: Seems very anxious.     Adult ECG Report  Rate: 112 ;  Rhythm: sinus tachycardia and light touch enlargement otherwise normal axis, intervals and durations. ;   Narrative Interpretation: Borderline due to  sinus tachycardia  Recent Labs: Reviewed Lab Results  Component Value Date   CHOL 202 (H) 08/08/2021   HDL 56 08/08/2021   LDLCALC 130 (H) 08/08/2021   TRIG 87 08/08/2021   Lab Results  Component Value Date   CREATININE 0.91 11/06/2021   BUN 12 11/06/2021   NA 138 11/06/2021   K 4.0 11/06/2021   CL 105 11/06/2021   CO2 26 11/06/2021      Latest Ref Rng & Units 11/06/2021   10:33 AM 08/08/2021    4:17 PM 05/28/2021    4:05 PM  CBC  WBC 4.0 - 10.5 K/uL 8.5  10.6  10.2   Hemoglobin 12.0 - 15.0 g/dL 14.3  15.5  14.8   Hematocrit 36.0 - 46.0 % 45.0  45.8  45.3   Platelets 150 - 400 K/uL 405  374  420     No results found for: "HGBA1C" Lab Results  Component Value Date   TSH 0.740 03/22/2021    ================================================== I spent a total of 29 minutes with the patient spent in direct patient consultation.  Additional time spent with chart review  / charting (studies, outside notes, etc): 42 min Total Time: 71 min  Current medicines are reviewed at length with the patient today.  (+/- concerns)    Notice: This dictation was prepared with Dragon dictation along with smart phrase technology. Any transcriptional errors that result from this process are unintentional and may not be corrected upon  review.   Studies Ordered:  Orders Placed This Encounter  Procedures   Cardiac Stress Test: Informed Consent Details: Physician/Practitioner Attestation; Transcribe to consent form and obtain patient signature   LONG TERM MONITOR (3-14 DAYS)   Exercise Tolerance Test   EKG 12-Lead   No orders of the defined types were placed in this encounter.   Patient Instructions / Medication Changes & Studies & Tests Ordered   Shared Decision Making/Informed Consent{ The risks [chest pain, shortness of breath, cardiac arrhythmias, dizziness, blood pressure fluctuations, myocardial infarction, stroke/transient ischemic attack, and life-threatening complications (estimated to be 1 in 10,000)], benefits (risk stratification, diagnosing coronary artery disease, treatment guidance) and alternatives of an exercise tolerance test were discussed in detail with Mariah Gonzalez and she agrees to proceed.  Patient Instructions  Medication Instructions:  - Your physician recommends that you continue on your current medications as directed. Please refer to the Current Medication list given to you today.  *If you need a refill on your cardiac medications before your next appointment, please call your pharmacy*   Lab Work: - none ordered  If you have labs (blood work) drawn today and your tests are completely normal, you will receive your results only by: [place] (if you have MyChart) OR A paper copy in the mail If you have any lab test that is abnormal or we need to change your treatment, we will call you to review the results.   Testing/Procedures:  1) Heart Monitor:  Length of Wear: 7 days (place after Thanksgiving- around 11/26)   Your monitor will be mailed to your home address within 3-5 business days. However, if you have not received your monitor after 5 business days please send [place] a MyChart message or call the office at [phone], so we may follow up on this for you.   Your  physician has recommended that you wear a Zio XT monitor.   2) Treadmill Stress Test: [facility] office)- AFTER THANKSGIVING   Follow-Up: At [facility], you  and your health needs are our priority.  As part of our continuing mission to provide you with exceptional heart care, we have created designated Provider Care Teams.  These Care Teams include your primary Cardiologist (physician) and Advanced Practice Providers (APPs -  Physician Assistants and Nurse Practitioners) who all work together to provide you with the care you need, when you need it.  We recommend signing up for the patient portal called "MyChart".  Sign up information is provided on this After Visit Summary.  MyChart is used to connect with patients for Virtual Visits (Telemedicine).  Patients are able to view lab/test results, encounter notes, upcoming appointments, etc.  Non-urgent messages can be sent to your provider as well.   To learn more about what you can do with MyChart, go to [email or web address].    Your next appointment:   6-7 week(s)  The format for your next appointment:   In Person  Provider:   You may see Glenetta Hew, MD or one of the following Advanced Practice Providers on your designated Care Team:   Murray Hodgkins, NP Christell Faith, PA-C Cadence Kathlen Mody, PA-C Gerrie Nordmann, NP    Other Instructions  Important Information About Sugar     Leonie Man, MD, MS Glenetta Hew, M.D., M.S. Interventional Cardiologist  [facility]   [address] [place], [place]  [address] [phone]           Fax [phone]    Thank you for choosing [facility] in [place]!!

## 2021-12-26 NOTE — Patient Instructions (Addendum)
Medication Instructions:  - Your physician recommends that you continue on your current medications as directed. Please refer to the Current Medication list given to you today.  *If you need a refill on your cardiac medications before your next appointment, please call your pharmacy*   Lab Work: - none ordered  If you have labs (blood work) drawn today and your tests are completely normal, you will receive your results only by: MyChart Message (if you have MyChart) OR A paper copy in the mail If you have any lab test that is abnormal or we need to change your treatment, we will call you to review the results.   Testing/Procedures:  1) Heart Monitor:  Length of Wear: 7 days (place after Thanksgiving- around 11/26)   Your monitor will be mailed to your home address within 3-5 business days. However, if you have not received your monitor after 5 business days please send [place] a MyChart message or call the office at [phone], so we may follow up on this for you.   Your physician has recommended that you wear a Zio XT monitor.   This monitor is a medical device that records the heart's electrical activity. Doctors most often use these monitors to diagnose arrhythmias. Arrhythmias are problems with the speed or rhythm of the heartbeat. The monitor is a small device applied to your chest. You can wear one while you do your normal daily activities. While wearing this monitor if you have any symptoms to push the button and record what you felt. Once you have worn this monitor for the period of time provider prescribed (Usually 14 days), you will return the monitor device in the postage paid box. Once it is returned they will download the data collected and provide [place] with a report which the provider will then review and we will call you with those results. Important tips:  Avoid showering during the first 24 hours of wearing the monitor. Avoid excessive sweating to help maximize wear time. Do  not submerge the device, no hot tubs, and no swimming pools. Keep any lotions or oils away from the patch. After 24 hours you may shower with the patch on. Take brief showers with your back facing the shower head.  Do not remove patch once it has been placed because that will interrupt data and decrease adhesive wear time. Push the button when you have any symptoms and write down what you were feeling. Once you have completed wearing your monitor, remove and place into box which has postage paid and place in your outgoing mailbox.  If for some reason you have misplaced your box then call our office and we can provide another box and/or mail it off for you.      2) Treadmill Stress Test: [facility] office)- AFTER THANKSGIVING  - Your physician has requested that you have an exercise tolerance test (treadmill stress test).   - you may eat a light breakfast/ lunch prior to your procedure - no caffeine for 24 hours prior to your test (coffee, tea, soft drinks, or chocolate)  - no smoking/ vaping for 4 hours prior to your test - you may take your regular medications the day of your test  - bring any inhalers with you to your test - wear comfortable clothing & tennis/ non-skid shoes to walk on the treadmill    Follow-Up: At [facility], you and your health needs are our priority.  As part of our continuing mission to provide you  with exceptional heart care, we have created designated Provider Care Teams.  These Care Teams include your primary Cardiologist (physician) and Advanced Practice Providers (APPs -  Physician Assistants and Nurse Practitioners) who all work together to provide you with the care you need, when you need it.  We recommend signing up for the patient portal called "MyChart".  Sign up information is provided on this After Visit Summary.  MyChart is used to connect with patients for Virtual Visits (Telemedicine).  Patients are able to view lab/test results,  encounter notes, upcoming appointments, etc.  Non-urgent messages can be sent to your provider as well.   To learn more about what you can do with MyChart, go to [email or web address].    Your next appointment:   6-7 week(s)  The format for your next appointment:   In Person  Provider:   You may see Bryan Lemma, MD or one of the following Advanced Practice Providers on your designated Care Team:   Nicolasa Ducking, NP Eula Listen, PA-C Cadence Fransico Michael, PA-C Charlsie Quest, NP    Other Instructions  Exercise Stress Test An exercise stress test is a test to check how your heart works during exercise. You will need to walk on a treadmill or ride an exercise bike for this test. An electrocardiogram (ECG) will record your heartbeat when you are at rest and when you are exercising. You may have an ultrasound or nuclear scan after the exercise test. The test is done to check for coronary artery disease (CAD). It is also done to: See how well you can exercise. Watch for high blood pressure during exercise. Test how well you can exercise after treatment. Check the blood flow to your arms and legs. If your test result is not normal, more testing may be needed. Tell a doctor about: Any allergies you have. All medicines you are taking. This includes vitamins, herbs, eye drops, creams, and over-the-counter medicines. Any surgeries you have had. Tell your doctor if you have a pacemaker or a defibrillator called an ICD. Any bleeding problems you have. Any medical conditions you have. Whether you are pregnant or may be pregnant. What are the risks? Pain or pressure in the following areas: Chest. Jaw or neck. Between your shoulder blades. Down your left arm. Legs. Feeling dizzy or light-headed. Shortness of breath. Irregular heartbeat. Feeling like you may vomit (nausea) or vomiting. What happens before the test? Follow instructions from your doctor about what you cannot eat or drink. Do  not have any drinks or foods that have caffeine in them for 24 hours before the test, or as told by your doctor. This includes coffee, tea (even decaf tea), sodas, chocolate, and cocoa. Ask your doctor about changing or stopping: Over-the-counter medicines. Vitamins, herbs, and supplements. Your normal medicines. This is important if: You take diabetes medicines. Ask how to take insulin or pills. You may be told to change your insulin dose the morning of the test. You take beta-blocker medicines. These medicines may cause problems in your test. You may be told to stop taking them 24 hours before the test. You wear a nitroglycerin patch. The patch may need to be taken off before the test. Do not smoke or use any products that contain nicotine or tobacco for 4 hours before the test. If you need help quitting, ask your doctor. If you use an inhaler, bring it with you to the test. Do not put lotions, powders, creams, or oils on your chest before the test. Wear  comfortable shoes and loose-fitting clothing. What happens during the test?  Patches (electrodes) will be put on your chest. Wires will be connected to the patches. The wires will send signals to a machine to record your heartbeat. Your heart rate will be watched while you are resting and while you are exercising. Your blood pressure and oxygen levels will also be watched during the test. You will walk on a treadmill or use an exercise bike. If you cannot use these, you may be asked to turn a crank with your hands. The activity will get harder and will raise your heart rate. You may be asked to breathe into a tube a few times during the test. This measures the gases that you breathe out. You will be asked how you are feeling throughout the test. You will exercise until your heart reaches a target heart rate. You will stop early if: You have chest pain. You feel dizzy. You are out of breath. You are too tired to keep going. Your blood  pressure is too high or too low. You have an irregular heartbeat. You have pain or aching in your arms or legs. The test may vary among doctors and hospitals. What can I expect after the test? You will be monitored until you leave the hospital or clinic. This includes checking your blood pressure, heart rate, breathing rate, and blood oxygen level. You may return to your normal diet and activities as told by your doctor. It is up to you to get the results of your test. Ask how to get your results when they are ready. Summary An exercise stress test is a test to check how your heart works during exercise. This test is done to check for coronary artery disease. Your heart rate will be watched while you are resting and while you are exercising. Follow instructions from your doctor about what you cannot eat or drink before the test. This information is not intended to replace advice given to you by your health care provider. Make sure you discuss any questions you have with your health care provider. Document Revised: 12/11/2020 Document Reviewed: 12/11/2020 Elsevier Patient Education  2023 Elsevier Inc.   Important Information About Sugar

## 2021-12-28 ENCOUNTER — Encounter: Payer: Self-pay | Admitting: Cardiology

## 2021-12-28 DIAGNOSIS — Z8249 Family history of ischemic heart disease and other diseases of the circulatory system: Secondary | ICD-10-CM | POA: Insufficient documentation

## 2021-12-28 DIAGNOSIS — R079 Chest pain, unspecified: Secondary | ICD-10-CM | POA: Insufficient documentation

## 2021-12-28 NOTE — Assessment & Plan Note (Signed)
Triglycerides are pretty well controlled with fenofibrate.

## 2021-12-28 NOTE — Assessment & Plan Note (Signed)
Family history reviewed and updated. She is too young for Coronary Calcium Score.    Checking GXT to exclude ischemia although I do not think her chest pain is cardiac in nature.  This is more for reassurance purposes.  Given her anxiety.  With her family history, would probably want to start screening for an LDL closer to 70.  She is not on statin, but is on fenofibrate.  Would add to be very judicious in use of probably rosuvastatin.  This will be determined based on the results of her GXT.

## 2021-12-28 NOTE — Assessment & Plan Note (Signed)
Discussed importance smoking cessation.  Major risk factors that she has other than her family history are smoking obesity and her dyslipidemia.  Smoking is probably the worst.  Especially now in light of all of her URI symptoms.  She knows she needs to quit, just does not think she is ready to do so yet.  Lots of stress.

## 2021-12-28 NOTE — Assessment & Plan Note (Signed)
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  Also having palpitations, which could be exacerbated by Adderall.

## 2021-12-28 NOTE — Assessment & Plan Note (Signed)
She does not really feel her heart rate that fast.  But she does always have fast heart rates when she goes places.  The question is is this true inappropriate sinus tachycardia or is it just related to stress and anxiety.  She is also on antibiotics and steroids.  Plan: Check Zio patch monitor.

## 2021-12-28 NOTE — Assessment & Plan Note (Signed)
Unusual chest pain symptoms.  I think this is probably related to her URI and even potentially costochondritis.  She does not focal pain on palpation, but given the association with stress, and family history of CAD, will evaluate for ischemia.  Plan: Allow her to recover from her current URI and we will plan for a GXT to be performed in early December.

## 2022-01-08 ENCOUNTER — Ambulatory Visit: Payer: BC Managed Care – PPO | Admitting: Family Medicine

## 2022-01-15 ENCOUNTER — Encounter: Payer: Self-pay | Admitting: Cardiology

## 2022-01-15 ENCOUNTER — Encounter: Payer: Self-pay | Admitting: Family Medicine

## 2022-01-15 NOTE — Telephone Encounter (Signed)
Pt scheduled  

## 2022-01-15 NOTE — Telephone Encounter (Signed)
Please offer appt

## 2022-01-17 ENCOUNTER — Ambulatory Visit: Payer: BC Managed Care – PPO | Admitting: Family Medicine

## 2022-01-17 ENCOUNTER — Encounter: Payer: Self-pay | Admitting: Family Medicine

## 2022-01-17 VITALS — BP 137/83 | HR 102 | Temp 98.9°F

## 2022-01-17 DIAGNOSIS — R052 Subacute cough: Secondary | ICD-10-CM

## 2022-01-17 DIAGNOSIS — J189 Pneumonia, unspecified organism: Secondary | ICD-10-CM

## 2022-01-17 MED ORDER — PREDNISONE 50 MG PO TABS: 50.0000 mg | ORAL_TABLET | Freq: Every day | ORAL | 0 refills | Status: DC

## 2022-01-17 MED ORDER — LEVOFLOXACIN 750 MG PO TABS: 750.0000 mg | ORAL_TABLET | Freq: Every day | ORAL | 0 refills | Status: DC

## 2022-01-17 MED ORDER — HYDROCOD POLI-CHLORPHE POLI ER 10-8 MG/5ML PO SUER: 5.0000 mL | Freq: Two times a day (BID) | ORAL | 0 refills | Status: AC | PRN

## 2022-01-17 NOTE — Progress Notes (Signed)
BP 137/83   Pulse (!) 102   Temp 98.9 F (37.2 C) (Oral)   SpO2 98%    Subjective:    Patient ID: Mariah Gonzalez, female    DOB: March 15, 1990, 31 y.o.   MRN: [id number]  HPI: Mariah Gonzalez is a 31 y.o. female  Chief Complaint  Patient presents with   URI    Pt states she has not been getting better, states she is still feeling bad and coughing. States she has one day of prednisone left.    UPPER RESPIRATORY TRACT INFECTION- was feeling better, but then started feeling worse again.  Duration: about 5 weeks Worst symptom: cough Fever: no Cough: yes Shortness of breath: yes Wheezing: yes Chest pain: no Chest tightness: yes Chest congestion: yes Nasal congestion: no Runny nose: no Post nasal drip: no Sneezing: no Sore throat: no Swollen glands: no Sinus pressure: no Headache: no Face pain: no Toothache: no Ear pain: no  Ear pressure: no  Eyes red/itching:no Eye drainage/crusting: no  Vomiting: no Rash: no Fatigue: yes Sick contacts: yes Strep contacts: no  Context: worse Recurrent sinusitis: no Relief with OTC cold/cough medications: no  Treatments attempted:  steroids and antibiotics   Relevant past medical, surgical, family and social history reviewed and updated as indicated. Interim medical history since our last visit reviewed. Allergies and medications reviewed and updated.  Review of Systems  Constitutional: Negative.   Respiratory: Negative.    Cardiovascular: Negative.   Gastrointestinal: Negative.   Musculoskeletal: Negative.   Psychiatric/Behavioral: Negative.      Per HPI unless specifically indicated above     Objective:    BP 137/83   Pulse (!) 102   Temp 98.9 F (37.2 C) (Oral)   SpO2 98%   Wt Readings from Last 3 Encounters:  12/26/21 245 lb (111.1 kg)  12/24/21 248 lb (112.5 kg)  12/09/21 250 lb 6.4 oz (113.6 kg)    Physical Exam Vitals and nursing note reviewed.  Constitutional:      General: She  is not in acute distress.    Appearance: Normal appearance. She is obese. She is not ill-appearing, toxic-appearing or diaphoretic.  HENT:     Head: Normocephalic and atraumatic.     Right Ear: External ear normal.     Left Ear: External ear normal.     Nose: Nose normal.     Mouth/Throat:     Mouth: Mucous membranes are moist.     Pharynx: Oropharynx is clear.  Eyes:     General: No scleral icterus.       Right eye: No discharge.        Left eye: No discharge.     Extraocular Movements: Extraocular movements intact.     Conjunctiva/sclera: Conjunctivae normal.     Pupils: Pupils are equal, round, and reactive to light.  Cardiovascular:     Rate and Rhythm: Normal rate and regular rhythm.     Pulses: Normal pulses.     Heart sounds: Normal heart sounds. No murmur heard.    No friction rub. No gallop.  Pulmonary:     Effort: Pulmonary effort is normal. No respiratory distress.     Breath sounds: No stridor. Rhonchi (bilateral bases) present. No wheezing or rales.     Comments: Barking cough Chest:     Chest wall: No tenderness.  Musculoskeletal:        General: Normal range of motion.     Cervical back: Normal range of motion and  neck supple.  Skin:    General: Skin is warm and dry.     Capillary Refill: Capillary refill takes less than 2 seconds.     Coloration: Skin is not jaundiced or pale.     Findings: No bruising, erythema, lesion or rash.  Neurological:     General: No focal deficit present.     Mental Status: She is alert and oriented to person, place, and time. Mental status is at baseline.  Psychiatric:        Mood and Affect: Mood normal.        Behavior: Behavior normal.        Thought Content: Thought content normal.        Judgment: Judgment normal.     Results for orders placed or performed in visit on 12/24/21  Novel Coronavirus, NAA ([facility])   Specimen: Nasopharyngeal(NP) swabs in vial transport medium  Result Value Ref Range   SARS-CoV-2, NAA Not  Detected Not Detected  Veritor Flu A/B Waived  Result Value Ref Range   Influenza A Negative Negative   Influenza B Negative Negative      Assessment & Plan:   Problem List Items Addressed This Visit   None Visit Diagnoses     Pneumonia of both lower lobes due to infectious organism    -  Primary   Will treat with levaquin and prednisone burst. Go for CXR if not significantly better by Monday.   Relevant Medications   levofloxacin (LEVAQUIN) 750 MG tablet   chlorpheniramine-HYDROcodone (TUSSIONEX) 10-8 MG/5ML   Subacute cough       Relevant Orders   DG Chest 2 View        Follow up plan: Return if symptoms worsen or fail to improve.

## 2022-01-18 NOTE — Telephone Encounter (Signed)
Unfortunately - I have not been able to read the study -- I need to have the EKG report to read the study.   I am only in  1 day / week & for the last 3 weeks have not actually been in clinic.  Once I am able to actually read the study, I can report on it.  Result of Monitor will be available - but often best discussed in f/u appointment.  Bryan Lemma, MD

## 2022-01-18 NOTE — Telephone Encounter (Signed)
Monitor results available   Need EKG strips from [facility] to read --> I can read once I am actually in Clinic.  Just need to make sure that EKG printout is available.   DH

## 2022-01-20 IMAGING — DX DG LUMBAR SPINE COMPLETE 4+V
5 series · 5 of 5 positions shown · non-contrast
Comparison: None.

CLINICAL DATA: Chronic low back pain

EXAM:
LUMBAR SPINE - COMPLETE 4+ VIEW

[l-spine obl (1 of 2)]
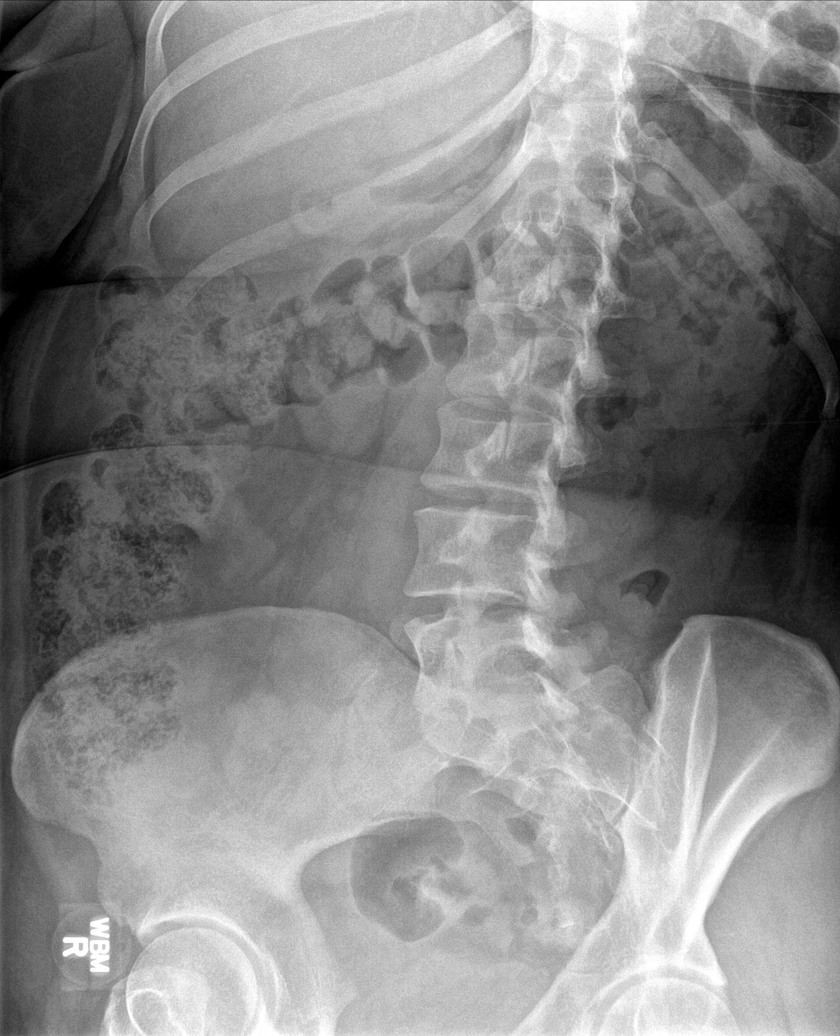

[l-spine obl (2 of 2)]
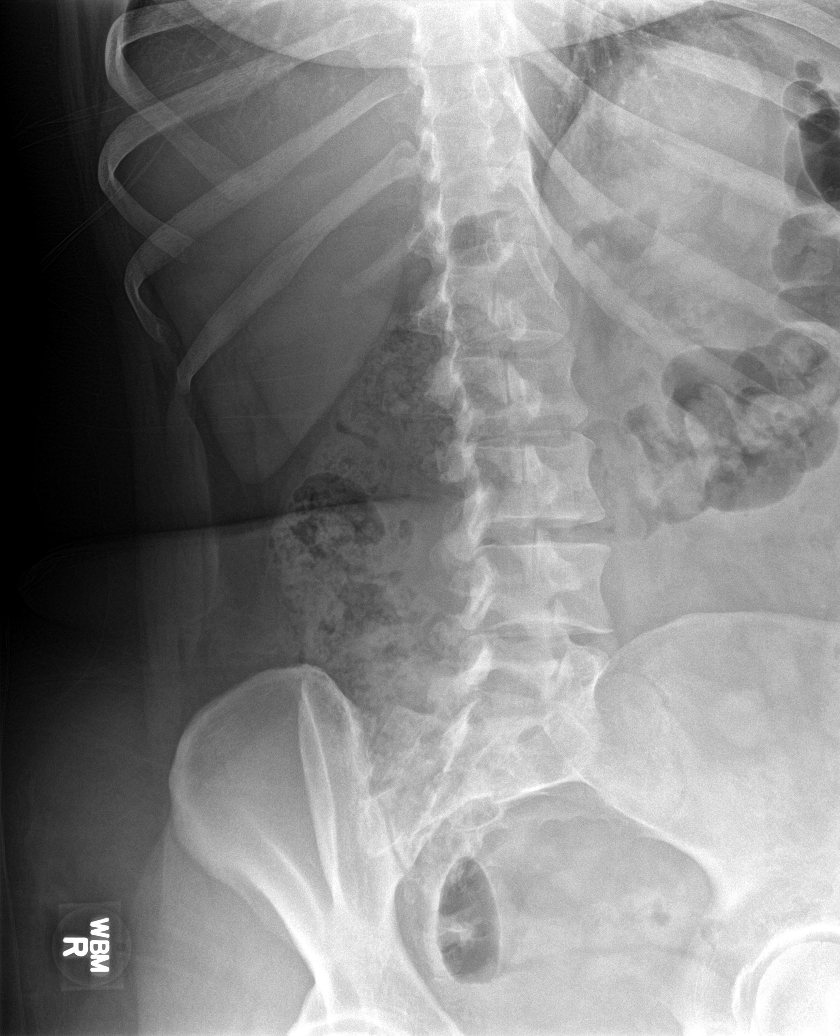

[l-spine lat]
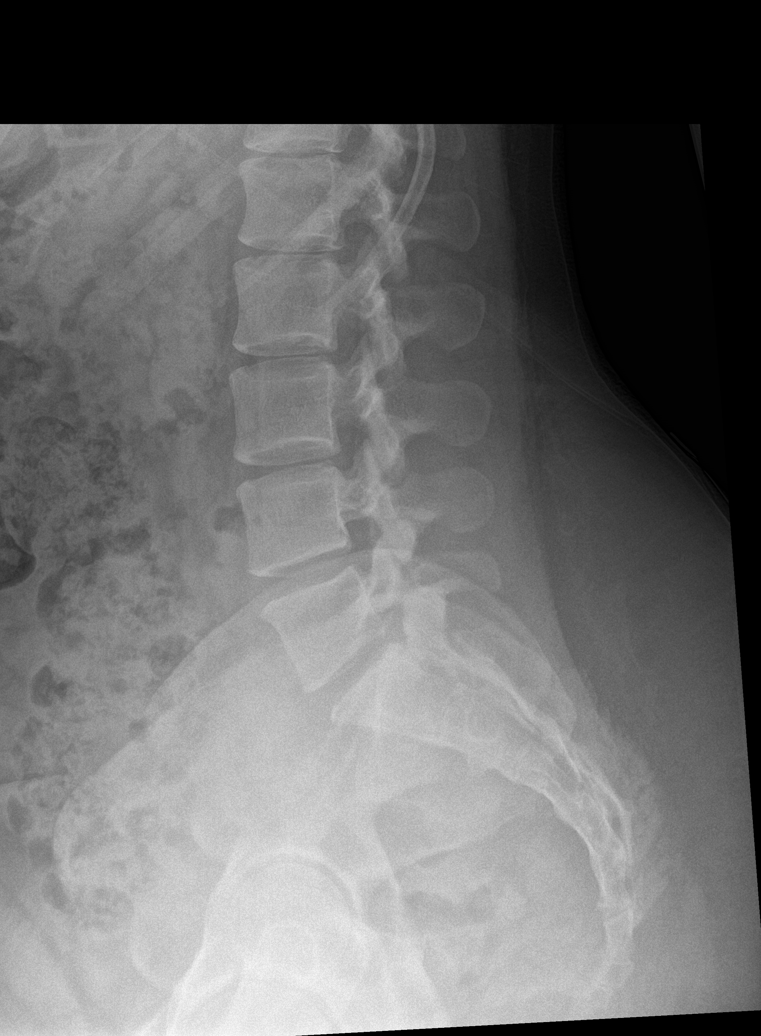

[l-spine spot]
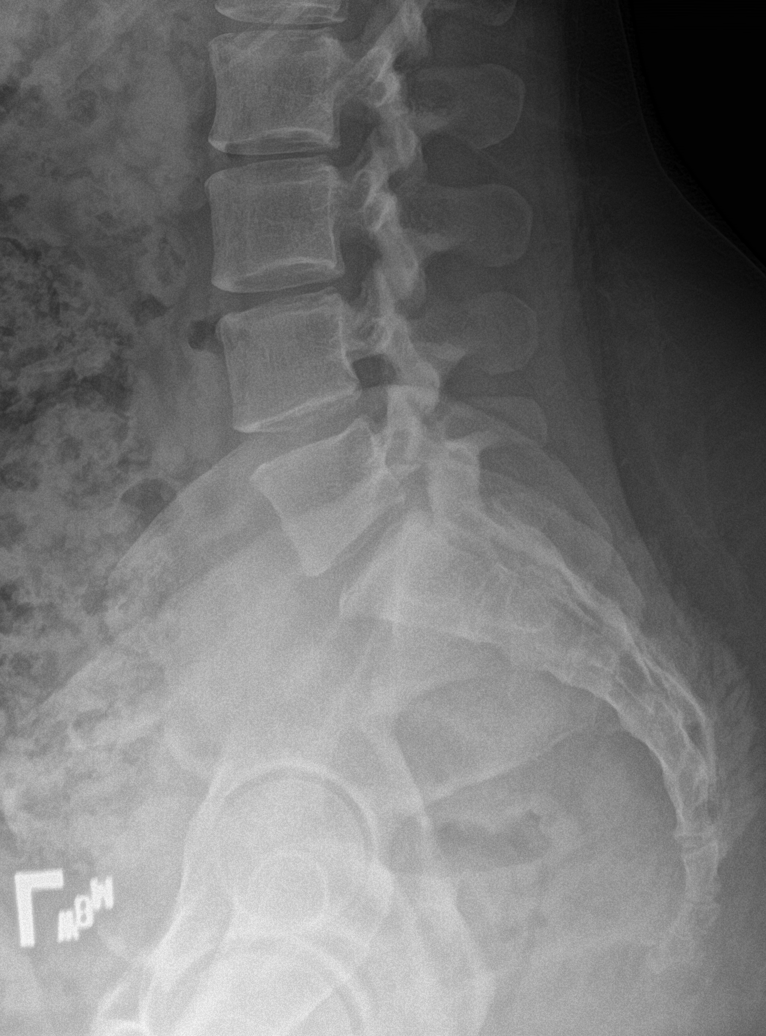

[l-spine ap]
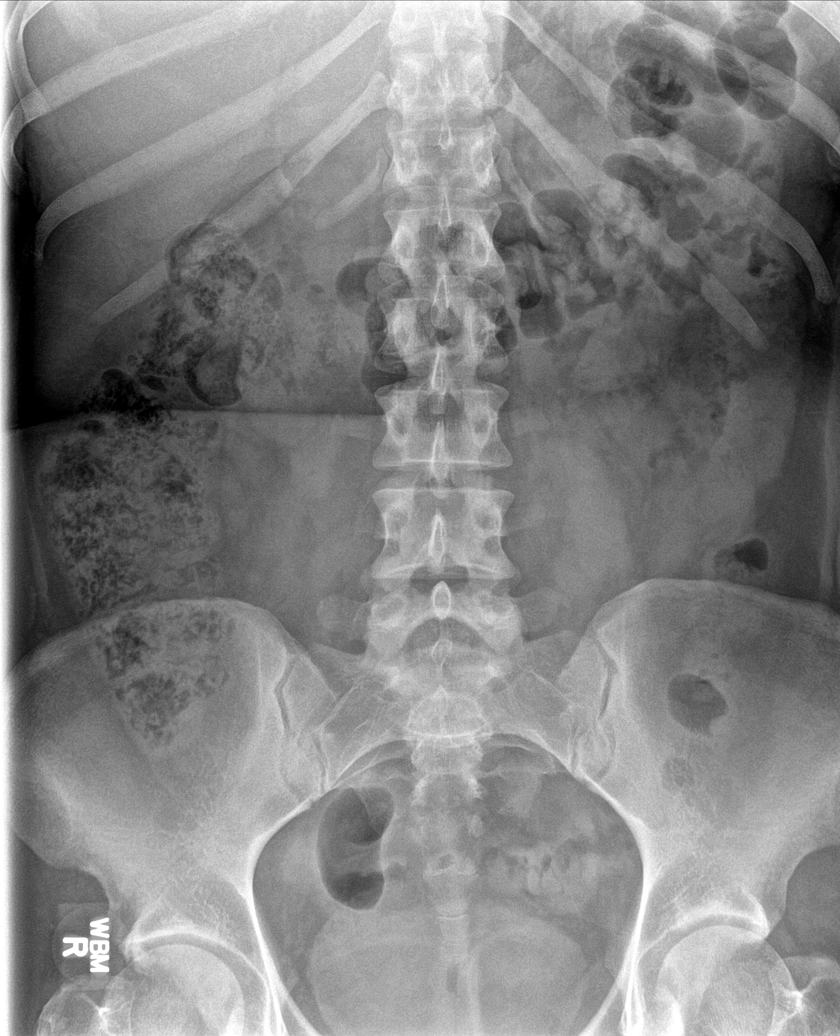

[5 of 5 positions shown; findings below may reference images not displayed]

FINDINGS: There is no evidence of lumbar spine fracture. Alignment is normal.
Intervertebral disc spaces are maintained.
IMPRESSION: Negative.

## 2022-01-21 ENCOUNTER — Ambulatory Visit
Admission: RE | Admit: 2022-01-21 | Discharge: 2022-01-21 | Disposition: A | Discharge status: Home / Self Care | Payer: BC Managed Care – PPO | Attending: Family Medicine | Admitting: Family Medicine

## 2022-01-21 ENCOUNTER — Ambulatory Visit
Admission: RE | Admit: 2022-01-21 | Discharge: 2022-01-21 | Disposition: A | Discharge status: Home / Self Care | Payer: BC Managed Care – PPO | Source: Ambulatory Visit | Attending: Family Medicine | Admitting: Family Medicine

## 2022-01-21 DIAGNOSIS — R052 Subacute cough: Secondary | ICD-10-CM

## 2022-01-23 NOTE — Telephone Encounter (Signed)
If done in [place] then it is not referred for me to read.  (Not centimeter)

## 2022-01-23 NOTE — Telephone Encounter (Signed)
If this done in [place], then it is not centimeter reviewed.  One of the noninterventional guys reads them.  It looks like she is mostly following up with Aldean Ast on the fifth which can happen if she is getting her stress test done then.  I think the follow-up probably needs to be rescheduled.  Bryan Lemma, MD

## 2022-01-30 ENCOUNTER — Telehealth: Payer: Self-pay

## 2022-01-30 NOTE — Telephone Encounter (Signed)
Detailed instructions left on the patient's answering machine. Asked to call back with any questions. Asked to call back with any questions. S.Sophi Calligan EMTP

## 2022-02-05 ENCOUNTER — Other Ambulatory Visit: Payer: Self-pay | Admitting: Family Medicine

## 2022-02-06 ENCOUNTER — Telehealth (HOSPITAL_COMMUNITY): Payer: Self-pay | Admitting: *Deleted

## 2022-02-06 NOTE — Telephone Encounter (Signed)
Patient given detailed instructions per Stress Test Requisition Sheet for test on 02/11/2022 at 3:30.Patient Notified to arrive 15 minutes early, and that it is imperative to arrive on time for appointment to keep from having the test rescheduled.  Patient verbalized understanding. Mariah Gonzalez

## 2022-02-06 NOTE — Telephone Encounter (Signed)
Requested Prescriptions  Pending Prescriptions Disp Refills   montelukast (SINGULAIR) 10 MG tablet [Pharmacy Med Name: MONTELUKAST 10MG  TABLETS] 90 tablet 1    Sig: TAKE 1 TABLET(10 MG) BY MOUTH AT BEDTIME     Pulmonology:  Leukotriene Inhibitors Passed - 02/05/2022  3:21 AM      Passed - Valid encounter within last 12 months    Recent Outpatient Visits           2 weeks ago Pneumonia of both lower lobes due to infectious organism   [facility] [place], Megan P, DO   1 month ago Moderate persistent asthma without status asthmaticus with acute exacerbation   [facility] [place], Megan P, DO   1 month ago Acute non-recurrent maxillary sinusitis   [facility] [place], Megan P, DO   3 months ago Hives   [facility], [place], DO   4 months ago Rash   [facility] [place], [place], NP       Future Appointments             In 1 week Dorie Rank, NP Charlsie Quest [facility] [place] [employer]   In 1 week [employer], Laural Benes, DO Oralia Rud, [facility]

## 2022-02-07 ENCOUNTER — Ambulatory Visit: Payer: BC Managed Care – PPO | Admitting: Cardiology

## 2022-02-11 ENCOUNTER — Ambulatory Visit (HOSPITAL_COMMUNITY): Payer: BC Managed Care – PPO | Attending: Internal Medicine

## 2022-02-11 ENCOUNTER — Encounter: Payer: BC Managed Care – PPO | Admitting: Family Medicine

## 2022-02-11 DIAGNOSIS — Z8249 Family history of ischemic heart disease and other diseases of the circulatory system: Secondary | ICD-10-CM | POA: Insufficient documentation

## 2022-02-11 DIAGNOSIS — R079 Chest pain, unspecified: Secondary | ICD-10-CM | POA: Diagnosis not present

## 2022-02-12 LAB — EXERCISE TOLERANCE TEST
Angina Index: 0
Duke Treadmill Score: 6
Estimated workload: 7
Exercise duration (min): 5 min
Exercise duration (sec): 31 s
MPHR: 191 {beats}/min
Peak HR: 162 {beats}/min
Percent HR: 85 %
RPE: 19
Rest HR: 113 {beats}/min
ST Depression (mm): 0 mm

## 2022-02-13 NOTE — Progress Notes (Signed)
Cardiology Clinic Note   Patient Name: Mariah Gonzalez Date of Encounter: 02/14/2022  Primary Care Provider:  Valerie Roys, DO Primary Cardiologist:  Glenetta Hew, MD  Patient Profile    32 year old female with a past medical history of tachycardia, social anxiety disorder, tobacco abuse, hypertriglyceridemia, family history of coronary artery disease, asthma, migraine, gastroesophageal reflux disease, bipolar, who presents today for follow-up on recent complaints of chest pain after outpatient testing.  Past Medical History    Past Medical History:  Diagnosis Date   ADHD    Anxiety    Asthma    Depressed    DUB (dysfunctional uterine bleeding)    Dysfunctional uterine bleeding    GERD (gastroesophageal reflux disease)    High cholesterol    History of COVID-19 01/24/2021   Hypertension    IBS (irritable bowel syndrome)    IDA (iron deficiency anemia)    LGSIL on Pap smear of cervix    Migraine without aura and without status migrainosus, not intractable    Pelvic pain in female    Past Surgical History:  Procedure Laterality Date   debridement of rught eye     ESOPHAGOGASTRODUODENOSCOPY (EGD) WITH PROPOFOL N/A 08/05/2021   Procedure: ESOPHAGOGASTRODUODENOSCOPY (EGD) WITH PROPOFOL;  Surgeon: Lin Landsman, MD;  Location: [facility] ENDOSCOPY;  Service: Gastroenterology;  Laterality: N/A;   gum grafting     TONSILLECTOMY Bilateral 07/03/2020   Procedure: TONSILLECTOMY;  Surgeon: Clyde Canterbury, MD;  Location: [place];  Service: ENT;  Laterality: Bilateral;   WISDOM TOOTH EXTRACTION      Allergies  Allergies  Allergen Reactions   Eggs Or Egg-Derived Products Diarrhea    History of Present Illness    Mariah Gonzalez a 32 year old female with previously mentioned past medical history of tachycardia, social anxiety disorder, tobacco abuse, hypertriglyceridemia, family history of coronary artery disease, asthma, migraines,  gastroesophageal reflux disease, bipolar, who had recently had complaints of chest pain and underwent exercise tolerance testing.  She was evaluated at [facility] emergency department 12/24/2021 with 3 to 4 days of upper respiratory tract infection with symptoms of cough and fever lasting for approximately 4 days.  Head congestion with postnasal drip.  She was started on Zithromax and prednisone taper.  She had seen 2 weeks prior with similar symptoms and at that time was treated with doxycycline and prednisone.  She had complaints of chest discomfort and fast heart rates.  She was seen by Dr. Ellyn Hack in clinic on 12/26/2021 which she had previously had chest pain off and on for the last few weeks a couple times a day or sometimes not at all.  She had a lot of stress that was going on with nonwork issues.  And was concerned because of family history of early coronary artery disease.  She also has chronic hypertriglyceridemia which is probably related to her Abilify but has been well-controlled on her Tricor.  At that time she was scheduled for exercise stress testing.  Exercise treadmill stress test revealed inappropriate sinus tachycardia at rest but EKG was negative for findings of myocardial ischemia.  This considered a low risk test but did reveal elevated heart rates.  She returns to clinic today stating overall she is doing well today.  She continues to remain tachycardic and is having some palpitations with it.  She denies any current chest pain or worsening shortness of breath.  She states that she has had fast heart rates since that she was in middle  school.  They had previously tried changing some of her medications around but she was intolerant at that time.  She has not had any further hospitalizations or visits to the emergency department since her last appointment.  Home Medications    Current Outpatient Medications  Medication Sig Dispense Refill   albuterol (PROVENTIL) (2.5 MG/3ML) 0.083%  nebulizer solution Take 3 mLs (2.5 mg total) by nebulization every 6 (six) hours as needed for wheezing or shortness of breath. 150 mL 6   albuterol (VENTOLIN HFA) 108 (90 Base) MCG/ACT inhaler INHALE 2 PUFFS INTO THE LUNGS EVERY 6 HOURS AS NEEDED FOR WHEEZING OR SHORTNESS OF BREATH 18 g 3   amphetamine-dextroamphetamine (ADDERALL XR) 15 MG 24 hr capsule Take by mouth every morning.     ARIPiprazole (ABILIFY) 10 MG tablet Take 10 mg by mouth daily.     baclofen (LIORESAL) 10 MG tablet TAKE 1/2 TO 1 TABLET(5 TO 10 MG) BY MOUTH THREE TIMES DAILY 180 tablet 1   cyclobenzaprine (FLEXERIL) 10 MG tablet Take 1 tablet (10 mg total) by mouth at bedtime. 30 tablet 2   dextroamphetamine (DEXTROSTAT) 5 MG tablet Take by mouth.     fenofibrate (TRICOR) 145 MG tablet Take 1 tablet (145 mg total) by mouth daily. 90 tablet 1   gabapentin (NEURONTIN) 100 MG capsule Take 200 mg by mouth at bedtime. 90 capsule 3   hydrocortisone cream 1 % Apply 1 application. topically 2 (two) times daily. 30 g 0   lamoTRIgine (LAMICTAL) 200 MG tablet TAKE 1 TABLET(200 MG) BY MOUTH DAILY 90 tablet 0   lamoTRIgine (LAMICTAL) 25 MG tablet TAKE 2 TABLETS BY MOUTH EVERY DAY. TO BE COMBINED WITH 200MG  EVERY DAY AT BEDTIME 180 tablet 0   lidocaine (XYLOCAINE) 2 % solution SMARTSIG:Milliliter(s) Topical Daily PRN     LORazepam (ATIVAN) 0.5 MG tablet Take 1 tablet (0.5 mg total) by mouth as directed. Take 1 tablet 1-2 times a day as needed for severe anxiety attacks only - please limit use 21 tablet 0   MELATONIN PO Take by mouth.     metoprolol succinate (TOPROL-XL) 25 MG 24 hr tablet Take 0.5 tablets (12.5 mg total) by mouth daily with supper. 45 tablet 1   montelukast (SINGULAIR) 10 MG tablet TAKE 1 TABLET(10 MG) BY MOUTH AT BEDTIME 90 tablet 1   omeprazole (PRILOSEC) 40 MG capsule TAKE 1 CAPSULE(40 MG) BY MOUTH DAILY 90 capsule 0   ondansetron (ZOFRAN) 4 MG tablet Take 1 tablet (4 mg total) by mouth every 8 (eight) hours as needed for  nausea or vomiting. 60 tablet 3   promethazine (PHENERGAN) 25 MG suppository Place 1 suppository (25 mg total) rectally every 6 (six) hours as needed for nausea or vomiting. 30 each 2   sucralfate (CARAFATE) 1 g tablet TAKE 1 TABLET(1 GRAM) BY MOUTH FOUR TIMES DAILY AT BEDTIME AND WITH MEALS 120 tablet 2   traZODone (DESYREL) 50 MG tablet TAKE 2 TABLETS(100 MG) BY MOUTH AT BEDTIME 180 tablet 2   TRELEGY ELLIPTA 100-62.5-25 MCG/ACT AEPB INHALE 1 PUFF INTO THE LUNGS DAILY 60 each 12   triamcinolone cream (KENALOG) 0.1 % Apply 1 Application topically 2 (two) times daily. 45 g 2   levofloxacin (LEVAQUIN) 750 MG tablet Take 1 tablet (750 mg total) by mouth daily. (Patient not taking: Reported on 02/14/2022) 7 tablet 0   predniSONE (DELTASONE) 50 MG tablet Take 1 tablet (50 mg total) by mouth daily with breakfast. (Patient not taking: Reported on 02/14/2022) 5 tablet  0   No current facility-administered medications for this visit.     Family History    Family History  Problem Relation Age of Onset   Hyperlipidemia Mother    Hypertension Mother    Depression Mother    Hyperlipidemia Father    Hypertension Father    Mental illness Father    Heart attack Father 35       Had 2 heart attacks   Stroke Father    Coronary artery disease Father    Diabetes Mellitus II Father    Hypertension Brother    Diabetes Paternal Grandfather    Heart attack Paternal Grandfather 65   Breast cancer Maternal Aunt    Breast cancer Maternal Aunt    Cancer Maternal Uncle    Breast cancer Paternal Aunt    Hearing loss Paternal Aunt    Cancer Paternal Uncle    Hearing loss Paternal Uncle    Diabetes Paternal Uncle    Heart attack Half-Brother 4   Coronary artery disease Half-Brother 36       CABG x4   Stroke Half-Brother 24   She indicated that her mother is alive. She indicated that her father is alive. She indicated that the status of her brother is unknown. She indicated that the status of her paternal  grandfather is unknown. She indicated that the status of her maternal uncle is unknown. She indicated that the status of her other is unknown. She indicated that both of her half-brothers are alive.  Social History    Social History   Socioeconomic History   Marital status: Married    Spouse name: Not on file   Number of children: Not on file   Years of education: Not on file   Highest education level: Bachelor's degree (e.g., BA, AB, BS)  Occupational History   Occupation: Pharmacist, [employer]    Comment: Sign Language -  Tobacco Use   Smoking status: Every Day    Packs/day: 0.50    Years: 2.00    Total pack years: 1.00    Types: Cigarettes    Passive exposure: Never   Smokeless tobacco: Never  Vaping Use   Vaping Use: Never used  Substance and Sexual Activity   Alcohol use: Yes    Comment: occasionally   Drug use: Yes    Types: Marijuana, Hydromorphone    Comment: Delta 8- chewed a gummy on 02/13/2022   Sexual activity: Yes  Other Topics Concern   Not on file  Social History Narrative   Not on file   Social Determinants of Health   Financial Resource Strain: Not on file  Food Insecurity: Not on file  Transportation Needs: Not on file  Physical Activity: Not on file  Stress: Not on file  Social Connections: Not on file  Intimate Partner Violence: Not on file     Review of Systems    General:  No chills, fever, night sweats or weight changes.  Endorses fatigue Cardiovascular:  No chest pain, endorses exertional dyspnea on exertion, edema, orthopnea, endorses palpitations, paroxysmal nocturnal dyspnea. Dermatological: No rash, lesions/masses Respiratory: No cough, endorses exertional dyspnea Urologic: No hematuria, dysuria Abdominal:   No nausea, vomiting, diarrhea, bright red blood per rectum, melena, or hematemesis Neurologic:  No visual changes, wkns, changes in mental status. All other systems reviewed and are otherwise negative except as noted above.    Physical Exam    VS:  BP (!) 140/78 (BP Location: Left Arm, Patient Position: Sitting,  Cuff Size: Large)   Pulse (!) 105   Ht 5' 1.5" (1.562 m)   Wt 263 lb 2 oz (119.4 kg)   SpO2 97%   BMI 48.91 kg/m  , BMI Body mass index is 48.91 kg/m.     GEN: Well nourished, well developed, in no acute distress. HEENT: normal.  Facial flushing Neck: Supple, no JVD, carotid bruits, or masses. Cardiac: RRR, tachycardic,  no murmurs, rubs, or gallops. No clubbing, cyanosis, edema.  Radials 2+/PT 2+ and equal bilaterally.  Respiratory:  Respirations regular and unlabored, clear to auscultation bilaterally. GI: Soft, nontender, nondistended, BS + x 4. MS: no deformity or atrophy. Skin: warm and dry, no rash. Neuro:  Strength and sensation are intact. Psych: Normal affect.  Accessory Clinical Findings    ECG personally reviewed by me today-sinus tachycardia with a rate of 105 with LVH- No acute changes  Lab Results  Component Value Date   WBC 8.5 11/06/2021   HGB 14.3 11/06/2021   HCT 45.0 11/06/2021   MCV 86.4 11/06/2021   PLT 405 (H) 11/06/2021   Lab Results  Component Value Date   CREATININE 0.91 11/06/2021   BUN 12 11/06/2021   NA 138 11/06/2021   K 4.0 11/06/2021   CL 105 11/06/2021   CO2 26 11/06/2021   Lab Results  Component Value Date   ALT 18 08/08/2021   AST 19 08/08/2021   ALKPHOS 58 08/08/2021   BILITOT 0.3 08/08/2021   Lab Results  Component Value Date   CHOL 202 (H) 08/08/2021   HDL 56 08/08/2021   LDLCALC 130 (H) 08/08/2021   TRIG 87 08/08/2021    No results found for: "HGBA1C"  Assessment & Plan   1.  Tachycardia with a heart rate of 105.  EKG continues to reveal tachycardia.  Patient states that she has had tachycardia since approximately middle school.  She does suffer from palpitations from elevated heart rate.  She had an appropriate sinus tachycardia with recent stress testing.  She states they have changed some of her medications around did not make a  difference in her heart rate previously.  She has been started on Toprol-XL 12.5 mg daily today, since she is symptomatic, prescription sent into the pharmacy of choice.  2.  Atypical chest pain that has resolved today.  Exercise stress testing completed with inappropriate sinus tachycardia noted but no other changes concerning for ischemia.  No ischemic changes noted on EKG today.  3.  Hypertriglyceridemia and seems to be under control on her fenofibrate therapy.  This is monitored by PCP.  4.  Nicotine excess with cessation recommended.  5.  Disposition patient return to clinic to see MD/APP in 4 weeks or sooner if needed to reevaluate medication changes made today.  Imaya Duffy, NP 02/14/2022, 3:00 PM

## 2022-02-14 ENCOUNTER — Ambulatory Visit: Payer: BC Managed Care – PPO | Attending: Cardiology | Admitting: Cardiology

## 2022-02-14 ENCOUNTER — Encounter: Payer: Self-pay | Admitting: Cardiology

## 2022-02-14 VITALS — BP 140/78 | HR 105 | Ht 61.5 in | Wt 263.1 lb

## 2022-02-14 DIAGNOSIS — E781 Pure hyperglyceridemia: Secondary | ICD-10-CM

## 2022-02-14 DIAGNOSIS — R000 Tachycardia, unspecified: Secondary | ICD-10-CM

## 2022-02-14 DIAGNOSIS — R0789 Other chest pain: Secondary | ICD-10-CM

## 2022-02-14 DIAGNOSIS — F17210 Nicotine dependence, cigarettes, uncomplicated: Secondary | ICD-10-CM

## 2022-02-14 MED ORDER — METOPROLOL SUCCINATE ER 25 MG PO TB24: 12.5000 mg | ORAL_TABLET | Freq: Every day | ORAL | 1 refills | Status: DC

## 2022-02-14 NOTE — Patient Instructions (Addendum)
Medication Instructions:  START Metoprolol Succinate 12.5 mg once daily at dinner (half a tablet) *If you need a refill on your cardiac medications before your next appointment, please call your pharmacy*   Lab Work: None ordered If you have labs (blood work) drawn today and your tests are completely normal, you will receive your results only by: [place] (if you have MyChart) OR A paper copy in the mail If you have any lab test that is abnormal or we need to change your treatment, we will call you to review the results.   Testing/Procedures: None ordered   Follow-Up: At [facility], you and your health needs are our priority.  As part of our continuing mission to provide you with exceptional heart care, we have created designated Provider Care Teams.  These Care Teams include your primary Cardiologist (physician) and Advanced Practice Providers (APPs -  Physician Assistants and Nurse Practitioners) who all work together to provide you with the care you need, when you need it.  We recommend signing up for the patient portal called "MyChart".  Sign up information is provided on this After Visit Summary.  MyChart is used to connect with patients for Virtual Visits (Telemedicine).  Patients are able to view lab/test results, encounter notes, upcoming appointments, etc.  Non-urgent messages can be sent to your provider as well.   To learn more about what you can do with MyChart, go to [email or web address].    Your next appointment:   4 week(s)  The format for your next appointment:   In Person  Provider:   You may see Glenetta Hew, MD or one of the following Advanced Practice Providers on your designated Care Team:   Murray Hodgkins, NP Christell Faith, PA-C Cadence Kathlen Mody, PA-C Gerrie Nordmann, NP

## 2022-02-17 ENCOUNTER — Ambulatory Visit (INDEPENDENT_AMBULATORY_CARE_PROVIDER_SITE_OTHER): Payer: BC Managed Care – PPO | Admitting: Family Medicine

## 2022-02-17 ENCOUNTER — Encounter: Payer: Self-pay | Admitting: Family Medicine

## 2022-02-17 VITALS — BP 137/92 | HR 101 | Temp 98.8°F | Ht 61.75 in | Wt 261.5 lb

## 2022-02-17 DIAGNOSIS — Z1283 Encounter for screening for malignant neoplasm of skin: Secondary | ICD-10-CM

## 2022-02-17 DIAGNOSIS — M545 Low back pain, unspecified: Secondary | ICD-10-CM

## 2022-02-17 DIAGNOSIS — Z808 Family history of malignant neoplasm of other organs or systems: Secondary | ICD-10-CM | POA: Diagnosis not present

## 2022-02-17 DIAGNOSIS — Z0000 Encounter for general adult medical examination without abnormal findings: Secondary | ICD-10-CM | POA: Diagnosis not present

## 2022-02-17 LAB — URINALYSIS, ROUTINE W REFLEX MICROSCOPIC
Bilirubin, UA: NEGATIVE
Glucose, UA: NEGATIVE
Ketones, UA: NEGATIVE
Leukocytes,UA: NEGATIVE
Nitrite, UA: NEGATIVE
Protein,UA: NEGATIVE
RBC, UA: NEGATIVE
Specific Gravity, UA: 1.02 (ref 1.005–1.030)
Urobilinogen, Ur: 0.2 mg/dL (ref 0.2–1.0)
pH, UA: 7.5 (ref 5.0–7.5)

## 2022-02-17 NOTE — Progress Notes (Signed)
BP (!) 137/92 (BP Location: Left Arm, Cuff Size: Normal)   Pulse (!) 101   Temp 98.8 F (37.1 C) (Oral)   Ht 5' 1.75" (1.568 m)   Wt 261 lb 8 oz (118.6 kg)   SpO2 98%   BMI 48.22 kg/m    Subjective:    Patient ID: Mariah Gonzalez, female    DOB: 1990-07-26, 32 y.o.   MRN: [id number]  HPI: Mariah Gonzalez is a 32 y.o. female presenting on 02/17/2022 for comprehensive medical examination. Current medical complaints include:  Has been having muscle spasms in her back about midway into her back. She notes that if she moves the wrong way she will spasm and feel worse. It helps when she lays on a heating pad, but it's still hurting.   She notes that she has been on the metoprolol for about 3 days and that it has given her a very dry mouth and makes her feel dizzy.  She currently lives with: wife Menopausal Symptoms: no  Depression Screen done today and results listed below:     02/17/2022    9:54 AM 01/17/2022   10:00 AM 12/09/2021    9:50 AM 10/15/2021    1:32 PM 09/24/2021    1:07 PM  Depression screen PHQ 2/9  Decreased Interest 1 1 1 1  0  Down, Depressed, Hopeless 1 1 1 1  0  PHQ - 2 Score 2 2 2 2  0  Altered sleeping 1 3 2 1 2   Tired, decreased energy 1 3 1 1 1   Change in appetite 1 2  2 1   Feeling bad or failure about yourself  0 1 0 0 0  Trouble concentrating 1 1 1 2 1   Moving slowly or fidgety/restless 0 1 1 0 1  Suicidal thoughts 0 0 0 0 0  PHQ-9 Score 6 13 7 8 6   Difficult doing work/chores Not difficult at all Very difficult Somewhat difficult Somewhat difficult Somewhat difficult    Past Medical History:  Past Medical History:  Diagnosis Date   ADHD    Anxiety    Asthma    Depressed    DUB (dysfunctional uterine bleeding)    Dysfunctional uterine bleeding    GERD (gastroesophageal reflux disease)    High cholesterol    History of COVID-19 01/24/2021   Hypertension    IBS (irritable bowel syndrome)    IDA (iron deficiency anemia)     LGSIL on Pap smear of cervix    Migraine without aura and without status migrainosus, not intractable    Pelvic pain in female     Surgical History:  Past Surgical History:  Procedure Laterality Date   debridement of rught eye     ESOPHAGOGASTRODUODENOSCOPY (EGD) WITH PROPOFOL N/A 08/05/2021   Procedure: ESOPHAGOGASTRODUODENOSCOPY (EGD) WITH PROPOFOL;  Surgeon: , MD;  Location: [facility];  Service: Gastroenterology;  Laterality: N/A;   gum grafting     TONSILLECTOMY Bilateral 07/03/2020   Procedure: TONSILLECTOMY;  Surgeon: , MD;  Location: [facility];  Service: ENT;  Laterality: Bilateral;   WISDOM TOOTH EXTRACTION      Medications:  Current Outpatient Medications on File Prior to Visit  Medication Sig   albuterol (PROVENTIL) (2.5 MG/3ML) 0.083% nebulizer solution Take 3 mLs (2.5 mg total) by nebulization every 6 (six) hours as needed for wheezing or shortness of breath.   albuterol (VENTOLIN HFA) 108 (90 Base) MCG/ACT inhaler INHALE 2 PUFFS INTO THE LUNGS EVERY 6 HOURS AS  NEEDED FOR WHEEZING OR SHORTNESS OF BREATH   amphetamine-dextroamphetamine (ADDERALL XR) 10 MG 24 hr capsule Take 10 mg by mouth daily.   amphetamine-dextroamphetamine (ADDERALL) 5 MG tablet Take 1 tablet by mouth daily. Patient is taking 0.5 tablet   ARIPiprazole (ABILIFY) 10 MG tablet Take 10 mg by mouth daily.   baclofen (LIORESAL) 10 MG tablet TAKE 1/2 TO 1 TABLET(5 TO 10 MG) BY MOUTH THREE TIMES DAILY   cyclobenzaprine (FLEXERIL) 10 MG tablet Take 1 tablet (10 mg total) by mouth at bedtime.   dextroamphetamine (DEXTROSTAT) 5 MG tablet Take by mouth.   fenofibrate (TRICOR) 145 MG tablet Take 1 tablet (145 mg total) by mouth daily.   gabapentin (NEURONTIN) 100 MG capsule Take 200 mg by mouth at bedtime.   hydrocortisone cream 1 % Apply 1 application. topically 2 (two) times daily.   lamoTRIgine (LAMICTAL) 200 MG tablet TAKE 1 TABLET(200 MG) BY MOUTH DAILY    lamoTRIgine (LAMICTAL) 25 MG tablet TAKE 2 TABLETS BY MOUTH EVERY DAY. TO BE COMBINED WITH 200MG  EVERY DAY AT BEDTIME   lidocaine (XYLOCAINE) 2 % solution SMARTSIG:Milliliter(s) Topical Daily PRN   LORazepam (ATIVAN) 0.5 MG tablet Take 1 tablet (0.5 mg total) by mouth as directed. Take 1 tablet 1-2 times a day as needed for severe anxiety attacks only - please limit use   MELATONIN PO Take by mouth.   metoprolol succinate (TOPROL-XL) 25 MG 24 hr tablet Take 0.5 tablets (12.5 mg total) by mouth daily with supper.   montelukast (SINGULAIR) 10 MG tablet TAKE 1 TABLET(10 MG) BY MOUTH AT BEDTIME   omeprazole (PRILOSEC) 40 MG capsule TAKE 1 CAPSULE(40 MG) BY MOUTH DAILY   ondansetron (ZOFRAN) 4 MG tablet Take 1 tablet (4 mg total) by mouth every 8 (eight) hours as needed for nausea or vomiting.   promethazine (PHENERGAN) 25 MG suppository Place 1 suppository (25 mg total) rectally every 6 (six) hours as needed for nausea or vomiting.   sucralfate (CARAFATE) 1 g tablet TAKE 1 TABLET(1 GRAM) BY MOUTH FOUR TIMES DAILY AT BEDTIME AND WITH MEALS   traZODone (DESYREL) 50 MG tablet TAKE 2 TABLETS(100 MG) BY MOUTH AT BEDTIME   TRELEGY ELLIPTA 100-62.5-25 MCG/ACT AEPB INHALE 1 PUFF INTO THE LUNGS DAILY   triamcinolone cream (KENALOG) 0.1 % Apply 1 Application topically 2 (two) times daily.   amphetamine-dextroamphetamine (ADDERALL XR) 15 MG 24 hr capsule Take by mouth every morning. (Patient not taking: Reported on 02/17/2022)   No current facility-administered medications on file prior to visit.    Allergies:  Allergies  Allergen Reactions   Eggs Or Egg-Derived Products Diarrhea    Social History:  Social History   Socioeconomic History   Marital status: Married    Spouse name: Not on file   Number of children: Not on file   Years of education: Not on file   Highest education level: Bachelor's degree (e.g., BA, AB, BS)  Occupational History   Occupation: 04/18/2022 - [employer]    Comment: Sign  Language -  Tobacco Use   Smoking status: Every Day    Packs/day: 0.50    Years: 2.00    Total pack years: 1.00    Types: Cigarettes    Passive exposure: Never   Smokeless tobacco: Never  Vaping Use   Vaping Use: Never used  Substance and Sexual Activity   Alcohol use: Yes    Comment: occasionally   Drug use: Yes    Types: Marijuana, Hydromorphone    Comment:  Delta 8- chewed a gummy on 02/13/2022   Sexual activity: Yes  Other Topics Concern   Not on file  Social History Narrative   Not on file   Social Determinants of Health   Financial Resource Strain: Not on file  Food Insecurity: Not on file  Transportation Needs: Not on file  Physical Activity: Not on file  Stress: Not on file  Social Connections: Not on file  Intimate Partner Violence: Not on file   Social History   Tobacco Use  Smoking Status Every Day   Packs/day: 0.50   Years: 2.00   Total pack years: 1.00   Types: Cigarettes   Passive exposure: Never  Smokeless Tobacco Never   Social History   Substance and Sexual Activity  Alcohol Use Yes   Comment: occasionally    Family History:  Family History  Problem Relation Age of Onset   Hyperlipidemia Mother    Hypertension Mother    Depression Mother    Hyperlipidemia Father    Hypertension Father    Mental illness Father    Heart attack Father 53       Had 2 heart attacks   Stroke Father    Coronary artery disease Father    Diabetes Mellitus II Father    Melanoma Father    Hypertension Brother    Diabetes Paternal Grandfather    Heart attack Paternal Grandfather 68   Breast cancer Maternal Aunt    Breast cancer Maternal Aunt    Cancer Maternal Uncle    Breast cancer Paternal Aunt    Hearing loss Paternal Aunt    Cancer Paternal Uncle    Hearing loss Paternal Uncle    Diabetes Paternal Uncle    Heart attack Half-Brother 80   Coronary artery disease Half-Brother 9       CABG x4   Stroke Half-Brother 65    Past medical history,  surgical history, medications, allergies, family history and social history reviewed with patient today and changes made to appropriate areas of the chart.   Review of Systems  Constitutional: Negative.   HENT: Negative.         Dry mouth, ear popping  Eyes: Negative.        Issues driving at night  Respiratory:  Positive for cough, shortness of breath and wheezing. Negative for hemoptysis and sputum production.   Cardiovascular: Negative.   Gastrointestinal:  Positive for heartburn and vomiting (since starting metoprolol). Negative for abdominal pain, blood in stool, constipation, diarrhea, melena and nausea.  Genitourinary:  Positive for frequency. Negative for dysuria, flank pain, hematuria and urgency.  Musculoskeletal: Negative.   Skin: Negative.   Neurological: Negative.   Endo/Heme/Allergies:  Negative for environmental allergies and polydipsia. Bruises/bleeds easily.  Psychiatric/Behavioral: Negative.     All other ROS negative except what is listed above and in the HPI.      Objective:    BP (!) 137/92 (BP Location: Left Arm, Cuff Size: Normal)   Pulse (!) 101   Temp 98.8 F (37.1 C) (Oral)   Ht 5' 1.75" (1.568 m)   Wt 261 lb 8 oz (118.6 kg)   SpO2 98%   BMI 48.22 kg/m   Wt Readings from Last 3 Encounters:  02/17/22 261 lb 8 oz (118.6 kg)  02/14/22 263 lb 2 oz (119.4 kg)  02/11/22 245 lb (111.1 kg)    Physical Exam Vitals and nursing note reviewed.  Constitutional:      General: She is not in acute distress.  Appearance: Normal appearance. She is obese. She is not ill-appearing, toxic-appearing or diaphoretic.  HENT:     Head: Normocephalic and atraumatic.     Right Ear: Tympanic membrane, ear canal and external ear normal. There is no impacted cerumen.     Left Ear: Tympanic membrane, ear canal and external ear normal. There is no impacted cerumen.     Nose: Nose normal. No congestion or rhinorrhea.     Mouth/Throat:     Mouth: Mucous membranes are moist.      Pharynx: Oropharynx is clear. No oropharyngeal exudate or posterior oropharyngeal erythema.  Eyes:     General: No scleral icterus.       Right eye: No discharge.        Left eye: No discharge.     Extraocular Movements: Extraocular movements intact.     Conjunctiva/sclera: Conjunctivae normal.     Pupils: Pupils are equal, round, and reactive to light.  Neck:     Vascular: No carotid bruit.  Cardiovascular:     Rate and Rhythm: Normal rate and regular rhythm.     Pulses: Normal pulses.     Heart sounds: No murmur heard.    No friction rub. No gallop.  Pulmonary:     Effort: Pulmonary effort is normal. No respiratory distress.     Breath sounds: Normal breath sounds. No stridor. No wheezing, rhonchi or rales.  Chest:     Chest wall: No tenderness.  Abdominal:     General: Abdomen is flat. Bowel sounds are normal. There is no distension.     Palpations: Abdomen is soft. There is no mass.     Tenderness: There is no abdominal tenderness. There is no right CVA tenderness, left CVA tenderness, guarding or rebound.     Hernia: No hernia is present.  Genitourinary:    Comments: Breast and pelvic exams deferred with shared decision making Musculoskeletal:        General: No swelling, tenderness, deformity or signs of injury.     Cervical back: Normal range of motion and neck supple. No rigidity. No muscular tenderness.     Right lower leg: No edema.     Left lower leg: No edema.  Lymphadenopathy:     Cervical: No cervical adenopathy.  Skin:    General: Skin is warm and dry.     Capillary Refill: Capillary refill takes less than 2 seconds.     Coloration: Skin is not jaundiced or pale.     Findings: No bruising, erythema, lesion or rash.  Neurological:     General: No focal deficit present.     Mental Status: She is alert and oriented to person, place, and time. Mental status is at baseline.     Cranial Nerves: No cranial nerve deficit.     Sensory: No sensory deficit.      Motor: No weakness.     Coordination: Coordination normal.     Gait: Gait normal.     Deep Tendon Reflexes: Reflexes normal.  Psychiatric:        Mood and Affect: Mood normal.        Behavior: Behavior normal.        Thought Content: Thought content normal.        Judgment: Judgment normal.     Results for orders placed or performed in visit on 02/11/22  Exercise Tolerance Test  Result Value Ref Range   Rest HR 113.0 bpm   Rest BP 100/50 mmHg   Exercise  duration (min) 5 min   Exercise duration (sec) 31 sec   Estimated workload 7.0    Peak HR 162 bpm   Peak BP 153/48 mmHg   MPHR 191 bpm   Percent HR 85.0 %   RPE 19.0    Angina Index 0    [facility] Treadmill Score 6    ST Depression (mm) 0 mm      Assessment & Plan:   Problem List Items Addressed This Visit   None Visit Diagnoses     Routine general medical examination at a health care facility    -  Primary   Vaccines up to date. Screening labs checked today. Pap up to date. Continue diet and exercise. Call with any concerns. Continue to monitor.   Relevant Orders   CBC with Differential/Platelet   Comprehensive metabolic panel   Lipid Panel w/o Chol/HDL Ratio   Urinalysis, Routine w reflex microscopic   TSH   Family history of melanoma       Referral to dermatology placed today.   Relevant Orders   Ambulatory referral to Dermatology   Screening for skin cancer       Referral to dermatology placed today.   Relevant Orders   Ambulatory referral to Dermatology   Acute bilateral low back pain without sciatica       Will have her do stretches, if not getting better in the next 3-4 weeks, will get her into PT.        Follow up plan: Return in about 6 months (around 08/18/2022).   LABORATORY TESTING:  - Pap smear: up to date  IMMUNIZATIONS:   - Tdap: Tetanus vaccination status reviewed: last tetanus booster within 10 years. - Influenza: Refused - Pneumovax: Up to date - Prevnar: Not applicable - COVID: Up to  date - HPV: Up to date  PATIENT COUNSELING:   Advised to take 1 mg of folate supplement per day if capable of pregnancy.   Sexuality: Discussed sexually transmitted diseases, partner selection, use of condoms, avoidance of unintended pregnancy  and contraceptive alternatives.   Advised to avoid cigarette smoking.  I discussed with the patient that most people either abstain from alcohol or drink within safe limits (<=14/week and <=4 drinks/occasion for males, <=7/weeks and <= 3 drinks/occasion for females) and that the risk for alcohol disorders and other health effects rises proportionally with the number of drinks per week and how often a drinker exceeds daily limits.  Discussed cessation/primary prevention of drug use and availability of treatment for abuse.   Diet: Encouraged to adjust caloric intake to maintain  or achieve ideal body weight, to reduce intake of dietary saturated fat and total fat, to limit sodium intake by avoiding high sodium foods and not adding table salt, and to maintain adequate dietary potassium and calcium preferably from fresh fruits, vegetables, and low-fat dairy products.    stressed the importance of regular exercise  Injury prevention: Discussed safety belts, safety helmets, smoke detector, smoking near bedding or upholstery.   Dental health: Discussed importance of regular tooth brushing, flossing, and dental visits.    NEXT PREVENTATIVE PHYSICAL DUE IN 1 YEAR. Return in about 6 months (around 08/18/2022).

## 2022-02-18 LAB — CBC WITH DIFFERENTIAL/PLATELET
Basophils Absolute: 0.1 10*3/uL (ref 0.0–0.2)
Basos: 1 %
EOS (ABSOLUTE): 0.1 10*3/uL (ref 0.0–0.4)
Eos: 1 %
Hematocrit: 41.1 % (ref 34.0–46.6)
Hemoglobin: 13.4 g/dL (ref 11.1–15.9)
Immature Grans (Abs): 0.1 10*3/uL (ref 0.0–0.1)
Immature Granulocytes: 1 %
Lymphocytes Absolute: 2.6 10*3/uL (ref 0.7–3.1)
Lymphs: 24 %
MCH: 28.1 pg (ref 26.6–33.0)
MCHC: 32.6 g/dL (ref 31.5–35.7)
MCV: 86 fL (ref 79–97)
Monocytes Absolute: 0.7 10*3/uL (ref 0.1–0.9)
Monocytes: 6 %
Neutrophils Absolute: 7.5 10*3/uL — ABNORMAL HIGH (ref 1.4–7.0)
Neutrophils: 67 %
Platelets: 430 10*3/uL (ref 150–450)
RBC: 4.77 x10E6/uL (ref 3.77–5.28)
RDW: 13.2 % (ref 11.7–15.4)
WBC: 11 10*3/uL — ABNORMAL HIGH (ref 3.4–10.8)

## 2022-02-18 LAB — COMPREHENSIVE METABOLIC PANEL
ALT: 19 IU/L (ref 0–32)
AST: 17 IU/L (ref 0–40)
Albumin/Globulin Ratio: 1.9 (ref 1.2–2.2)
Albumin: 4.3 g/dL (ref 4.0–5.0)
Alkaline Phosphatase: 66 IU/L (ref 44–121)
BUN/Creatinine Ratio: 14 (ref 9–23)
BUN: 13 mg/dL (ref 6–20)
Bilirubin Total: <0.2 mg/dL (ref 0.0–1.2)
CO2: 20 mmol/L (ref 20–29)
Calcium: 9.1 mg/dL (ref 8.7–10.2)
Chloride: 103 mmol/L (ref 96–106)
Creatinine, Ser: 0.93 mg/dL (ref 0.57–1.00)
Globulin, Total: 2.3 g/dL (ref 1.5–4.5)
Glucose: 94 mg/dL (ref 70–99)
Potassium: 4.2 mmol/L (ref 3.5–5.2)
Sodium: 136 mmol/L (ref 134–144)
Total Protein: 6.6 g/dL (ref 6.0–8.5)
eGFR: 85 mL/min/{1.73_m2} (ref >59)

## 2022-02-18 LAB — LIPID PANEL W/O CHOL/HDL RATIO
Cholesterol, Total: 191 mg/dL (ref 100–199)
HDL: 53 mg/dL (ref >39)
LDL Chol Calc (NIH): 111 mg/dL — ABNORMAL HIGH (ref 0–99)
Triglycerides: 155 mg/dL — ABNORMAL HIGH (ref 0–149)
VLDL Cholesterol Cal: 27 mg/dL (ref 5–40)

## 2022-02-18 LAB — TSH: TSH: 1.06 u[IU]/mL (ref 0.450–4.500)

## 2022-03-12 ENCOUNTER — Encounter: Payer: Self-pay | Admitting: Family Medicine

## 2022-03-31 NOTE — Progress Notes (Signed)
Cardiology Clinic Note   Patient Name: Mariah Gonzalez Date of Encounter: 04/04/2022  Primary Care Provider:  Valerie Roys, DO Primary Cardiologist:  Glenetta Hew, MD  Patient Profile    32 year old female with a past medical history of tachycardia, social anxiety disorder, tobacco abuse, hypertriglyceridemia, family history of coronary artery disease, asthma, migraines, gastroesophageal reflux disease, bipolar disorder, who presents today for follow-up on her tachycardia.  Past Medical History    Past Medical History:  Diagnosis Date   ADHD    Anxiety    Asthma    Depressed    DUB (dysfunctional uterine bleeding)    Dysfunctional uterine bleeding    GERD (gastroesophageal reflux disease)    High cholesterol    History of COVID-19 01/24/2021   Hypertension    IBS (irritable bowel syndrome)    IDA (iron deficiency anemia)    LGSIL on Pap smear of cervix    Migraine without aura and without status migrainosus, not intractable    Pelvic pain in female    Past Surgical History:  Procedure Laterality Date   debridement of rught eye     ESOPHAGOGASTRODUODENOSCOPY (EGD) WITH PROPOFOL N/A 08/05/2021   Procedure: ESOPHAGOGASTRODUODENOSCOPY (EGD) WITH PROPOFOL;  Surgeon: Lin Landsman, MD;  Location: [facility];  Service: Gastroenterology;  Laterality: N/A;   gum grafting     TONSILLECTOMY Bilateral 07/03/2020   Procedure: TONSILLECTOMY;  Surgeon: Clyde Canterbury, MD;  Location: [place];  Service: ENT;  Laterality: Bilateral;   WISDOM TOOTH EXTRACTION      Allergies  Allergies  Allergen Reactions   Eggs Or Egg-Derived Products Diarrhea    History of Present Illness    Mariah Gonzalez is a 32 year old female with previously mentioned past medical history.  She underwent exercise treadmill stress test revealed inappropriate sinus tachycardia at rest but EKG was negative for findings of myocardial ischemia.  This considered  low risk test but did reveal elevated heart rates.  She was last seen in clinic 02/14/2022 where she states overall she was doing well.  She does have tachycardia and has some palpitations associated with it.  She denies any recurrent chest discomfort or shortness of breath.  Since she was symptomatic with palpitations with heart rate of 105 on exam she was started on low-dose Toprol-XL 12.5 mg daily.  She returns to clinic today stating that she has not been feeling well for the last week.  She states that she started feeling bad on Wednesday but then went downhill again.  She endorses chills, nausea plus vomiting, diarrhea, decreased appetite and intake.  Denies any chest pain, shortness of breath, worsening palpitations, or peripheral edema.  States that she has tolerated the Toprol fine and her average heart rate is running in the upper 90s to low 100s with being on it.  She stated the first day that she took it she had felt tired and had odd side effects from it but after daily to all of those side effects had resolved.  She is concerned today for the possibility of having the flu or some of the GI viruses that are going around but is also accounting a large quantity of her symptoms to anxiety.  She denies any hospitalizations or visits to the emergency department.  Home Medications    Current Outpatient Medications  Medication Sig Dispense Refill   albuterol (PROVENTIL) (2.5 MG/3ML) 0.083% nebulizer solution Take 3 mLs (2.5 mg total) by nebulization every 6 (six) hours as  needed for wheezing or shortness of breath. 150 mL 6   albuterol (VENTOLIN HFA) 108 (90 Base) MCG/ACT inhaler INHALE 2 PUFFS INTO THE LUNGS EVERY 6 HOURS AS NEEDED FOR WHEEZING OR SHORTNESS OF BREATH 18 g 3   amphetamine-dextroamphetamine (ADDERALL XR) 10 MG 24 hr capsule Take 10 mg by mouth daily.     amphetamine-dextroamphetamine (ADDERALL) 5 MG tablet Take 1 tablet by mouth daily. Patient is taking 0.5 tablet     ARIPiprazole  (ABILIFY) 10 MG tablet Take 10 mg by mouth daily.     baclofen (LIORESAL) 10 MG tablet TAKE 1/2 TO 1 TABLET(5 TO 10 MG) BY MOUTH THREE TIMES DAILY 180 tablet 1   cyclobenzaprine (FLEXERIL) 10 MG tablet Take 1 tablet (10 mg total) by mouth at bedtime. 30 tablet 2   dextroamphetamine (DEXTROSTAT) 5 MG tablet Take by mouth.     fenofibrate (TRICOR) 145 MG tablet Take 1 tablet (145 mg total) by mouth daily. 90 tablet 1   gabapentin (NEURONTIN) 100 MG capsule Take 200 mg by mouth at bedtime. 90 capsule 3   hydrocortisone cream 1 % Apply 1 application. topically 2 (two) times daily. 30 g 0   lamoTRIgine (LAMICTAL) 200 MG tablet TAKE 1 TABLET(200 MG) BY MOUTH DAILY 90 tablet 0   lamoTRIgine (LAMICTAL) 25 MG tablet TAKE 2 TABLETS BY MOUTH EVERY DAY. TO BE COMBINED WITH '200MG'$  EVERY DAY AT BEDTIME 180 tablet 0   lidocaine (XYLOCAINE) 2 % solution SMARTSIG:Milliliter(s) Topical Daily PRN     LORazepam (ATIVAN) 0.5 MG tablet Take 1 tablet (0.5 mg total) by mouth as directed. Take 1 tablet 1-2 times a day as needed for severe anxiety attacks only - please limit use 21 tablet 0   MELATONIN PO Take by mouth.     metoprolol succinate (TOPROL-XL) 25 MG 24 hr tablet Take 0.5 tablets (12.5 mg total) by mouth daily with supper. 45 tablet 1   montelukast (SINGULAIR) 10 MG tablet TAKE 1 TABLET(10 MG) BY MOUTH AT BEDTIME 90 tablet 1   omeprazole (PRILOSEC) 40 MG capsule TAKE 1 CAPSULE(40 MG) BY MOUTH DAILY 90 capsule 0   ondansetron (ZOFRAN) 4 MG tablet Take 1 tablet (4 mg total) by mouth every 8 (eight) hours as needed for nausea or vomiting. 60 tablet 3   promethazine (PHENERGAN) 25 MG suppository Place 1 suppository (25 mg total) rectally every 6 (six) hours as needed for nausea or vomiting. 30 each 2   sucralfate (CARAFATE) 1 g tablet TAKE 1 TABLET(1 GRAM) BY MOUTH FOUR TIMES DAILY AT BEDTIME AND WITH MEALS 120 tablet 2   traZODone (DESYREL) 50 MG tablet TAKE 2 TABLETS(100 MG) BY MOUTH AT BEDTIME 180 tablet 2    TRELEGY ELLIPTA 100-62.5-25 MCG/ACT AEPB INHALE 1 PUFF INTO THE LUNGS DAILY 60 each 12   triamcinolone cream (KENALOG) 0.1 % Apply 1 Application topically 2 (two) times daily. 45 g 2   amphetamine-dextroamphetamine (ADDERALL XR) 15 MG 24 hr capsule Take by mouth every morning. (Patient not taking: Reported on 02/17/2022)     No current facility-administered medications for this visit.     Family History    Family History  Problem Relation Age of Onset   Hyperlipidemia Mother    Hypertension Mother    Depression Mother    Hyperlipidemia Father    Hypertension Father    Mental illness Father    Heart attack Father 65       Had 2 heart attacks   Stroke Father  Coronary artery disease Father    Diabetes Mellitus II Father    Melanoma Father    Hypertension Brother    Diabetes Paternal Grandfather    Heart attack Paternal Grandfather 62   Breast cancer Maternal Aunt    Breast cancer Maternal Aunt    Cancer Maternal Uncle    Breast cancer Paternal Aunt    Hearing loss Paternal Aunt    Cancer Paternal Uncle    Hearing loss Paternal Uncle    Diabetes Paternal Uncle    Heart attack Half-Brother 47   Coronary artery disease Half-Brother 25       CABG x4   Stroke Half-Brother 90   She indicated that her mother is alive. She indicated that her father is alive. She indicated that the status of her brother is unknown. She indicated that the status of her paternal grandfather is unknown. She indicated that the status of her maternal uncle is unknown. She indicated that the status of her other is unknown. She indicated that both of her half-brothers are alive.  Social History    Social History   Socioeconomic History   Marital status: Married    Spouse name: Not on file   Number of children: Not on file   Years of education: Not on file   Highest education level: Bachelor's degree (e.g., BA, AB, BS)  Occupational History   Occupation: Pharmacist, [employer]    Comment: Sign  Language -  Tobacco Use   Smoking status: Every Day    Packs/day: 0.50    Years: 2.00    Total pack years: 1.00    Types: Cigarettes    Passive exposure: Never   Smokeless tobacco: Never  Vaping Use   Vaping Use: Never used  Substance and Sexual Activity   Alcohol use: Yes    Comment: occasionally   Drug use: Yes    Types: Marijuana, Hydromorphone    Comment: Delta 8- chewed a gummy on 02/13/2022   Sexual activity: Yes  Other Topics Concern   Not on file  Social History Narrative   Not on file   Social Determinants of Health   Financial Resource Strain: Not on file  Food Insecurity: Not on file  Transportation Needs: Not on file  Physical Activity: Not on file  Stress: Not on file  Social Connections: Not on file  Intimate Partner Violence: Not on file     Review of Systems    General: Endorses chills, but denies fever, night sweats or weight changes.  Cardiovascular:  No chest pain, dyspnea on exertion, edema, orthopnea, endorses occasional palpitations, paroxysmal nocturnal dyspnea. Dermatological: No rash, lesions/masses Respiratory: Endorses congested smokers cough, dyspnea Urologic: No hematuria, dysuria Abdominal:   Endorses nausea, vomiting, diarrhea, but denies bright red blood per rectum, melena, or hematemesis Neurologic:  No visual changes, wkns, changes in mental status. All other systems reviewed and are otherwise negative except as noted above.   Physical Exam    VS:  BP 137/88 (BP Location: Left Arm, Patient Position: Sitting, Cuff Size: Normal) Comment (BP Location): forearm  Pulse (!) 104   Ht 5' 1.5" (1.562 m)   Wt 258 lb 3.2 oz (117.1 kg)   SpO2 98%   BMI 48.00 kg/m  , BMI Body mass index is 48 kg/m.     GEN: Well nourished, well developed, in no acute distress. HEENT: normal.  Facial flushing is noted Neck: Supple, no JVD, carotid bruits, or masses. Cardiac: RRR, good cardiac, no  murmurs, rubs, or gallops. No clubbing, cyanosis, edema.   Radials 2+/PT 2+ and equal bilaterally.  Respiratory:  Respirations regular and unlabored, clear to auscultation bilaterally.  Congested cough that is nonproductive GI: Soft, nontender, nondistended, BS + x 4. MS: no deformity or atrophy. Skin: warm and dry, no rash. Neuro:  Strength and sensation are intact. Psych: Normal affect.  Accessory Clinical Findings    ECG personally reviewed by me today-sinus tachycardia with a rate of 104 with possible left atrial enlargement, and LVH- No acute changes  Lab Results  Component Value Date   WBC 11.0 (H) 02/17/2022   HGB 13.4 02/17/2022   HCT 41.1 02/17/2022   MCV 86 02/17/2022   PLT 430 02/17/2022   Lab Results  Component Value Date   CREATININE 0.93 02/17/2022   BUN 13 02/17/2022   NA 136 02/17/2022   K 4.2 02/17/2022   CL 103 02/17/2022   CO2 20 02/17/2022   Lab Results  Component Value Date   ALT 19 02/17/2022   AST 17 02/17/2022   ALKPHOS 66 02/17/2022   BILITOT <0.2 02/17/2022   Lab Results  Component Value Date   CHOL 191 02/17/2022   HDL 53 02/17/2022   LDLCALC 111 (H) 02/17/2022   TRIG 155 (H) 02/17/2022    No results found for: "HGBA1C"  Assessment & Plan   1.  Sinus tachycardia verified by EKG today.  Patient denies any adverse side effects from Toprol-XL 12.5 mg daily.  Palpitations have slightly improved since being on current medication.  Heart rate has a little less at previous appointments.  Since she has been continued on Toprol-XL 12.5 mg daily without refills being needed today.  2.  Chills, nausea with vomiting, diarrhea, and decreased appetite and oral intake.  Concerning for possible flu or GI bug.  Encouraged to follow-up with PCP or get swabbed for flu and COVID.  She has been encouraged to increase her oral intake as tolerated with sipping on Gatorade or Pedialyte to prevent dehydration which can worsen her tachycardia and palpitations.  3.  Hypertriglyceridemia which has been under control on  current fenofibrate therapy.  This continues to be monitored by PCP.  4.  Nicotine excess with cessation recommended.  Patient is thinking of following 800 quit now and going through that process as she states since she has been sick this past week she has been unable to smoke which may make it easier for her to quit.  Encouraged to continue with her cessation efforts.  5.  Disposition patient return to clinic to see MD/APP in 4 months or sooner if needed.  Joy Haegele, NP 04/04/2022, 8:31 AM

## 2022-04-04 ENCOUNTER — Encounter: Payer: Self-pay | Admitting: Cardiology

## 2022-04-04 ENCOUNTER — Ambulatory Visit: Payer: BC Managed Care – PPO | Attending: Cardiology | Admitting: Cardiology

## 2022-04-04 ENCOUNTER — Other Ambulatory Visit: Payer: Self-pay | Admitting: Family Medicine

## 2022-04-04 ENCOUNTER — Ambulatory Visit: Payer: BC Managed Care – PPO | Admitting: Physician Assistant

## 2022-04-04 VITALS — BP 137/88 | HR 104 | Ht 61.5 in | Wt 258.2 lb

## 2022-04-04 VITALS — BP 130/75 | HR 101

## 2022-04-04 DIAGNOSIS — R6889 Other general symptoms and signs: Secondary | ICD-10-CM

## 2022-04-04 DIAGNOSIS — A084 Viral intestinal infection, unspecified: Secondary | ICD-10-CM

## 2022-04-04 DIAGNOSIS — J069 Acute upper respiratory infection, unspecified: Secondary | ICD-10-CM | POA: Diagnosis not present

## 2022-04-04 DIAGNOSIS — R000 Tachycardia, unspecified: Secondary | ICD-10-CM | POA: Diagnosis not present

## 2022-04-04 DIAGNOSIS — F17210 Nicotine dependence, cigarettes, uncomplicated: Secondary | ICD-10-CM

## 2022-04-04 DIAGNOSIS — R112 Nausea with vomiting, unspecified: Secondary | ICD-10-CM

## 2022-04-04 DIAGNOSIS — E781 Pure hyperglyceridemia: Secondary | ICD-10-CM

## 2022-04-04 MED ORDER — PROMETHAZINE HCL 25 MG RE SUPP: 25.0000 mg | Freq: Four times a day (QID) | RECTAL | 0 refills | Status: DC | PRN

## 2022-04-04 NOTE — Telephone Encounter (Signed)
Requested Prescriptions  Pending Prescriptions Disp Refills   fenofibrate (TRICOR) 145 MG tablet [Pharmacy Med Name: FENOFIBRATE '145MG'$  TABLETS] 90 tablet 0    Sig: TAKE 1 TABLET(145 MG) BY MOUTH DAILY     Cardiovascular:  Antilipid - Fibric Acid Derivatives Failed - 04/04/2022  3:21 AM      Failed - WBC in normal range and within 360 days    WBC  Date Value Ref Range Status  02/17/2022 11.0 (H) 3.4 - 10.8 x10E3/uL Final  11/06/2021 8.5 4.0 - 10.5 K/uL Final         Failed - Lipid Panel in normal range within the last 12 months    Cholesterol, Total  Date Value Ref Range Status  02/17/2022 191 100 - 199 mg/dL Final   LDL Chol Calc (NIH)  Date Value Ref Range Status  02/17/2022 111 (H) 0 - 99 mg/dL Final   HDL  Date Value Ref Range Status  02/17/2022 53 >39 mg/dL Final   Triglycerides  Date Value Ref Range Status  02/17/2022 155 (H) 0 - 149 mg/dL Final         Passed - ALT in normal range and within 360 days    ALT  Date Value Ref Range Status  02/17/2022 19 0 - 32 IU/L Final         Passed - AST in normal range and within 360 days    AST  Date Value Ref Range Status  02/17/2022 17 0 - 40 IU/L Final         Passed - Cr in normal range and within 360 days    Creatinine, Ser  Date Value Ref Range Status  02/17/2022 0.93 0.57 - 1.00 mg/dL Final         Passed - HGB in normal range and within 360 days    Hemoglobin  Date Value Ref Range Status  02/17/2022 13.4 11.1 - 15.9 g/dL Final         Passed - HCT in normal range and within 360 days    Hematocrit  Date Value Ref Range Status  02/17/2022 41.1 34.0 - 46.6 % Final         Passed - PLT in normal range and within 360 days    Platelets  Date Value Ref Range Status  02/17/2022 430 150 - 450 x10E3/uL Final         Passed - eGFR is 30 or above and within 360 days    GFR calc Af Amer  Date Value Ref Range Status  03/26/2020 139 >59 mL/min/1.73 Final    Comment:    **In accordance with recommendations  from the NKF-ASN Task force,**   [facility] is in the process of updating its eGFR calculation to the   2021 CKD-EPI creatinine equation that estimates kidney function   without a race variable.    GFR, Estimated  Date Value Ref Range Status  11/06/2021 >60 >60 mL/min Final    Comment:    (NOTE) Calculated using the CKD-EPI Creatinine Equation (2021)    eGFR  Date Value Ref Range Status  02/17/2022 85 >59 mL/min/1.73 Final         Passed - Valid encounter within last 12 months    Recent Outpatient Visits           1 month ago Routine general medical examination at a health care facility   [facility], [place] P, DO   2 months ago Pneumonia of both lower  lobes due to infectious organism   [place], [place], DO   3 months ago Moderate persistent asthma without status asthmaticus with acute exacerbation   [facility] [place], [place] P, DO   3 months ago Acute non-recurrent maxillary sinusitis   [place] [facility] [place], Megan P, DO   5 months ago [place], [place], DO       Future Appointments             Today M[place], [place], [facility]   In 4 months [place], Barbera Setters, NP [facility] [place]   In 4 months Wynetta Emery, [place], [place]

## 2022-04-04 NOTE — Patient Instructions (Signed)
Medication Instructions:   Your physician recommends that you continue on your current medications as directed. Please refer to the Current Medication list given to you today.  *If you need a refill on your cardiac medications before your next appointment, please call your pharmacy*   Lab Work:  None Ordered  If you have labs (blood work) drawn today and your tests are completely normal, you will receive your results only by: [place] (if you have MyChart) OR A paper copy in the mail If you have any lab test that is abnormal or we need to change your treatment, we will call you to review the results.   Testing/Procedures:  None Ordered   Follow-Up: At [facility], [place], you and your health needs are our priority.  As part of our continuing mission to provide you with exceptional heart care, we have created designated Provider Care Teams.  These Care Teams include your primary Cardiologist (physician) and Advanced Practice Providers (APPs -  Physician Assistants and Nurse Practitioners) who all work together to provide you with the care you need, when you need it.  We recommend signing up for the patient portal called "MyChart".  Sign up information is provided on this After Visit Summary.  MyChart is used to connect with patients for Virtual Visits (Telemedicine).  Patients are able to view lab/test results, encounter notes, upcoming appointments, etc.  Non-urgent messages can be sent to your provider as well.   To learn more about what you can do with MyChart, go to [email or web address].    Your next appointment:   4 month(s)  Provider:   You may see Glenetta Hew, MD or one of the following Advanced Practice Providers on your designated Care Team:   Murray Hodgkins, NP Christell Faith, PA-C Cadence Kathlen Mody, PA-C Gerrie Nordmann, NP

## 2022-04-04 NOTE — Progress Notes (Signed)
Acute Office Visit   Patient: Mariah Gonzalez   DOB: 07-24-1990   32 y.o. Female  MRN: [id number] Visit Date: 04/04/2022  Today's healthcare provider: Dani Gobble Alvena Kiernan, PA-C  Introduced myself to the patient as a Journalist, newspaper and provided education on APPs in clinical practice.    Chief Complaint  Patient presents with   flu like symptoms    Started on Monday   Subjective    HPI HPI     flu like symptoms    Additional comments: Started on Monday      Last edited by Jerelene Redden, CMA on 04/04/2022 10:15 AM.      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   albuterol (VENTOLIN HFA) 108 (90 Base) MCG/ACT inhaler INHALE 2 PUFFS INTO THE LUNGS EVERY 6 HOURS AS NEEDED FOR WHEEZING OR SHORTNESS OF BREATH   amphetamine-dextroamphetamine (ADDERALL XR) 10 MG 24 hr capsule Take 10 mg by mouth daily.   amphetamine-dextroamphetamine (ADDERALL XR) 15 MG 24 hr capsule Take by mouth every morning.    amphetamine-dextroamphetamine (ADDERALL) 5 MG tablet Take 1 tablet by mouth daily. Patient is taking 0.5 tablet   ARIPiprazole (ABILIFY) 10 MG tablet Take 10 mg by mouth daily.   baclofen (LIORESAL) 10 MG tablet TAKE 1/2 TO 1 TABLET(5 TO 10 MG) BY MOUTH THREE TIMES DAILY   cyclobenzaprine (FLEXERIL) 10 MG tablet Take 1 tablet (10 mg total) by mouth at bedtime.   dextroamphetamine (DEXTROSTAT) 5 MG tablet Take by mouth.   fenofibrate (TRICOR) 145 MG tablet TAKE 1 TABLET(145 MG) BY MOUTH DAILY   gabapentin (NEURONTIN) 100 MG capsule Take 200 mg by mouth at bedtime.   hydrocortisone cream 1 % Apply 1 application. topically 2 (two) times daily.   lamoTRIgine (LAMICTAL) 200 MG tablet TAKE 1 TABLET(200 MG) BY MOUTH DAILY   lamoTRIgine (LAMICTAL) 25 MG tablet TAKE 2 TABLETS BY MOUTH EVERY DAY. TO BE COMBINED WITH '200MG'$  EVERY DAY AT BEDTIME   lidocaine (XYLOCAINE) 2 % solution SMARTSIG:Milliliter(s) Topical Daily PRN   LORazepam (ATIVAN) 0.5 MG tablet Take 1 tablet (0.5 mg total) by mouth as directed. Take 1 tablet 1-2 times a day as needed for severe anxiety attacks only - please limit use   MELATONIN PO Take by mouth.   metoprolol succinate (TOPROL-XL) 25 MG 24 hr tablet Take 0.5 tablets (12.5 mg total) by mouth daily with supper.   montelukast (SINGULAIR) 10 MG tablet TAKE 1 TABLET(10 MG) BY MOUTH AT BEDTIME  omeprazole (PRILOSEC) 40 MG capsule TAKE 1 CAPSULE(40 MG) BY MOUTH DAILY   ondansetron (ZOFRAN) 4 MG tablet Take 1 tablet (4 mg total) by mouth every 8 (eight) hours as needed for nausea or vomiting.   sucralfate (CARAFATE) 1 g tablet TAKE 1 TABLET(1 GRAM) BY MOUTH FOUR TIMES DAILY AT BEDTIME AND WITH MEALS   traZODone (DESYREL) 50 MG tablet TAKE 2 TABLETS(100 MG) BY MOUTH AT BEDTIME   TRELEGY ELLIPTA 100-62.5-25 MCG/ACT AEPB INHALE 1 PUFF INTO THE LUNGS DAILY   triamcinolone cream (KENALOG) 0.1 % Apply 1 Application topically 2 (two) times daily.   [DISCONTINUED] promethazine (PHENERGAN) 25 MG  suppository Place 1 suppository (25 mg total) rectally every 6 (six) hours as needed for nausea or vomiting.   No facility-administered medications prior to visit.    Review of Systems  Constitutional:  Positive for chills and fatigue. Negative for fever.  HENT:  Positive for congestion, postnasal drip, sinus pressure and sinus pain. Negative for sore throat.   Respiratory:  Positive for cough. Negative for shortness of breath and wheezing.   Gastrointestinal:  Positive for diarrhea, nausea and vomiting.  Musculoskeletal:  Positive for myalgias.  Neurological:  Positive for headaches.       Objective    BP 130/75   Pulse (!) 101   SpO2 97%    Physical Exam Vitals reviewed.  Constitutional:      General: She is awake.     Appearance: Normal appearance. She is well-developed and well-groomed.  HENT:     Head: Normocephalic and atraumatic.     Right Ear: Hearing, tympanic membrane and ear canal normal.     Left Ear: Hearing, tympanic membrane and ear canal normal.     Mouth/Throat:     Lips: Pink.     Mouth: Mucous membranes are moist.     Pharynx: Oropharynx is clear. Uvula midline. No pharyngeal swelling, posterior oropharyngeal erythema or uvula swelling.  Eyes:     General: Lids are normal. Gaze aligned appropriately.     Extraocular Movements: Extraocular movements intact.     Conjunctiva/sclera: Conjunctivae normal.     Pupils: Pupils are equal, round, and reactive to light.  Cardiovascular:     Rate and Rhythm: Normal rate and regular rhythm.     Heart sounds: Normal heart sounds.  Pulmonary:     Effort: Pulmonary effort is normal.     Breath sounds: Normal breath sounds. No decreased air movement. No decreased breath sounds, wheezing, rhonchi or rales.  Abdominal:     General: Abdomen is protuberant. Bowel sounds are normal.     Palpations: Abdomen is soft.     Tenderness: There is generalized abdominal tenderness. There is no guarding or rebound. Negative signs  include Murphy's sign and McBurney's sign.  Musculoskeletal:     Cervical back: Normal range of motion.  Lymphadenopathy:     Head:     Right side of head: No submental, submandibular, tonsillar or posterior auricular adenopathy.     Left side of head: No submental, submandibular, tonsillar or posterior auricular adenopathy.     Cervical:     Right cervical: No superficial or posterior cervical adenopathy.    Left cervical: No superficial or posterior cervical adenopathy.  Neurological:     Mental Status: She is alert.  Psychiatric:        Attention and Perception: Attention and perception normal.        Mood and Affect: Mood normal. Affect is flat.  Speech: Speech normal.        Behavior: Behavior normal. Behavior is cooperative.       No results found for any visits on 04/04/22.  Assessment & Plan      No follow-ups on file.      Problem List Items Addressed This Visit   None Visit Diagnoses     Viral gastroenteritis    -  Primary Acute, new concern  Reports persistent nausea and vomiting along with intermittent diarrhea since Monday  She has not had her anti-nausea medications due to them expiring  Will send in refills of her Phenergan 25 mg suppository to assist with nausea, vomiting and PO intake  Recommend small meals with bland diet to assist with GI upset    Viral upper respiratory tract infection     Acute, new concern Given her exposure to several sick people I am unsure if she has a single viral illness causing URI and GI symptoms or two separate illnesses concurrently  She is outside the therapeutic window for COVID and Flu antivirals so no testing today Will treat both symptomatically at this time Reviewed OTC medications to assist with URI symptoms  Follow up as needed for persistent or progressing symptoms     Nausea and vomiting, unspecified vomiting type       Relevant Medications   promethazine (PHENERGAN) 25 MG suppository        No  follow-ups on file.   I, Fayola Meckes E Gissell Barra, PA-C, have reviewed all documentation for this visit. The documentation on 04/04/22 for the exam, diagnosis, procedures, and orders are all accurate and complete.   Talitha Givens, MHS, PA-C [facility]

## 2022-04-04 NOTE — Patient Instructions (Addendum)
Based on your described symptoms and the duration of symptoms it is likely that you have a viral upper respiratory infection (often called a "cold")  Symptoms can last for 3-10 days with lingering cough and intermittent symptoms lasting weeks after that.  The goal of treatment at this time is to reduce your symptoms and discomfort    You can use over the counter medications such as Dayquil/Nyquil, AlkaSeltzer formulations, etc to provide further relief of symptoms according to the manufacturer's instructions  If preferred you can use Coricidin to manage your symptoms rather than those medications mentioned above.   To help with your upset stomach I recommend a bland diet and staying well hydrated. You can supplement this with electrolyte replacement drinks like Pedialyte and Gatorade as tolerated.   If your symptoms do not improve or become worse in the next 5-7 days please make an apt at the office so we can see you  Go to the ER if you begin to have more serious symptoms such as shortness of breath, trouble breathing, loss of consciousness, swelling around the eyes, high fever, severe lasting headaches, vision changes or neck pain/stiffness.

## 2022-04-07 NOTE — Addendum Note (Signed)
Addended by: James Ivanoff D on: 04/07/2022 11:22 AM   Modules accepted: Orders

## 2022-04-09 ENCOUNTER — Encounter: Payer: Self-pay | Admitting: Family Medicine

## 2022-04-10 NOTE — Telephone Encounter (Signed)
Please print for folder and I'll look over

## 2022-04-12 ENCOUNTER — Other Ambulatory Visit: Payer: Self-pay | Admitting: Family Medicine

## 2022-04-13 ENCOUNTER — Other Ambulatory Visit: Payer: Self-pay | Admitting: Family Medicine

## 2022-04-14 MED ORDER — OMEPRAZOLE 40 MG PO CPDR: DELAYED_RELEASE_CAPSULE | ORAL | 0 refills | Status: DC

## 2022-04-14 NOTE — Telephone Encounter (Signed)
Reordered 04/14/22 by Mellody Memos DO  Requested Prescriptions  Refused Prescriptions Disp Refills   omeprazole (PRILOSEC) 40 MG capsule [Pharmacy Med Name: OMEPRAZOLE '40MG'$  CAPSULES] 90 capsule 0    Sig: TAKE 1 CAPSULE(40 MG) BY MOUTH DAILY     Gastroenterology: Proton Pump Inhibitors Passed - 04/13/2022  8:45 AM      Passed - Valid encounter within last 12 months    Recent Outpatient Visits           1 week ago Viral gastroenteritis   [facility] Mecum, Dani Gobble, PA-C   1 month ago Routine general medical examination at a health care facility   [facility], [place] P, DO   2 months ago Pneumonia of both lower lobes due to infectious organism   [place], Megan P, DO   3 months ago Moderate persistent asthma without status asthmaticus with acute exacerbation   [place] [facility] [place], Megan P, DO   4 months ago Acute non-recurrent maxillary sinusitis   [place], Barb Merino, DO       Future Appointments             In 3 months Hammock, Barbera Setters, NP [place] at [place]   In 4 months Wynetta Emery, [place], [place]

## 2022-04-14 NOTE — Telephone Encounter (Signed)
Disp Refills Start End   omeprazole (PRILOSEC) 40 MG capsule 90 capsule 0 04/14/2022    Sig: TAKE 1 CAPSULE(40 MG) BY MOUTH DAILY   Sent to pharmacy as: omeprazole (PRILOSEC) 40 MG capsule   E-Prescribing Status: Receipt confirmed by pharmacy (04/14/2022  9:20 AM EST)    Requested Prescriptions  Pending Prescriptions Disp Refills   omeprazole (PRILOSEC) 40 MG capsule [Pharmacy Med Name: OMEPRAZOLE '40MG'$  CAPSULES] 90 capsule 0    Sig: TAKE 1 CAPSULE(40 MG) BY MOUTH DAILY     Gastroenterology: Proton Pump Inhibitors Passed - 04/12/2022  9:05 AM      Passed - Valid encounter within last 12 months    Recent Outpatient Visits           1 week ago Viral gastroenteritis   [place], Dani Gobble, PA-C   1 month ago Routine general medical examination at a health care facility   [facility], [place] P, DO   2 months ago Pneumonia of both lower lobes due to infectious organism   [place], Megan P, DO   3 months ago Moderate persistent asthma without status asthmaticus with acute exacerbation   [place] [facility] [place] [place], Megan P, DO   4 months ago Acute non-recurrent maxillary sinusitis   [place], Barb Merino, DO       Future Appointments             In 3 months Hammock, Barbera Setters, NP [place] at [place]   In 4 months Wynetta Emery, [place], [place]

## 2022-06-19 ENCOUNTER — Encounter: Payer: Self-pay | Admitting: Family Medicine

## 2022-06-19 ENCOUNTER — Ambulatory Visit (INDEPENDENT_AMBULATORY_CARE_PROVIDER_SITE_OTHER): Payer: BC Managed Care – PPO | Admitting: Family Medicine

## 2022-06-19 VITALS — BP 117/79 | HR 97 | Temp 98.8°F | Ht 61.5 in | Wt 260.8 lb

## 2022-06-19 DIAGNOSIS — J0100 Acute maxillary sinusitis, unspecified: Secondary | ICD-10-CM

## 2022-06-19 DIAGNOSIS — J4541 Moderate persistent asthma with (acute) exacerbation: Secondary | ICD-10-CM | POA: Diagnosis not present

## 2022-06-19 MED ORDER — ALBUTEROL SULFATE (2.5 MG/3ML) 0.083% IN NEBU
2.5000 mg | INHALATION_SOLUTION | Freq: Once | RESPIRATORY_TRACT | Status: AC
  Administered 2022-06-19: 2.5 mg via RESPIRATORY_TRACT

## 2022-06-19 MED ORDER — AMOXICILLIN-POT CLAVULANATE 875-125 MG PO TABS: 1.0000 | ORAL_TABLET | Freq: Two times a day (BID) | ORAL | 0 refills | Status: DC

## 2022-06-19 MED ORDER — PREDNISONE 10 MG PO TABS: ORAL_TABLET | ORAL | 0 refills | Status: DC

## 2022-06-19 MED ORDER — TRIAMCINOLONE ACETONIDE 40 MG/ML IJ SUSP
40.0000 mg | Freq: Once | INTRAMUSCULAR | Status: AC
  Administered 2022-06-19: 40 mg via INTRAMUSCULAR

## 2022-06-19 NOTE — Progress Notes (Signed)
BP 117/79   Pulse 97   Temp 98.8 F (37.1 C) (Oral)   Ht 5' 1.5" (1.562 m)   Wt 260 lb 12.8 oz (118.3 kg)   SpO2 98%   BMI 48.48 kg/m    Subjective:    Patient ID: Mariah Gonzalez, female    DOB: January 21, 1991, 32 y.o.   MRN: [id number]  HPI: Mariah Gonzalez is a 32 y.o. female  Chief Complaint  Patient presents with   Nasal Congestion    Patient says she is really stuffy and stopped and says she is really having a runny nose and very fatigue. Patient says she has been symptomatic for around 2 weeks now. Patient says she tested for COVID last week and was negative.    Ear Fullness   Cough   Fatigue   UPPER RESPIRATORY TRACT INFECTION Duration: 2 weeks Worst symptom: cough and headache Fever: yes Cough: yes Shortness of breath: yes Wheezing: yes Chest pain: yes, with cough Chest tightness: yes Chest congestion: yes Nasal congestion: yes Runny nose: yes Post nasal drip: no Sneezing: no Sore throat: no Swollen glands: no Sinus pressure: yes Headache: yes Face pain: no Toothache: no Ear pain: no  Ear pressure: yes bilateral Eyes red/itching:no Eye drainage/crusting: no  Vomiting: no Rash: no Fatigue: yes Sick contacts: yes Strep contacts: no  Context: worse Recurrent sinusitis: no Relief with OTC cold/cough medications: no  Treatments attempted: inhalers   Relevant past medical, surgical, family and social history reviewed and updated as indicated. Interim medical history since our last visit reviewed. Allergies and medications reviewed and updated.  Review of Systems  Constitutional: Negative.   HENT:  Positive for congestion, postnasal drip, rhinorrhea, sinus pressure and sore throat. Negative for dental problem, drooling, ear discharge, ear pain, facial swelling, hearing loss, mouth sores, nosebleeds, sinus pain, sneezing, tinnitus and trouble swallowing.   Eyes: Negative.   Respiratory:  Positive for cough, chest tightness,  shortness of breath and wheezing. Negative for apnea, choking and stridor.   Cardiovascular: Negative.   Gastrointestinal: Negative.   Psychiatric/Behavioral: Negative.      Per HPI unless specifically indicated above     Objective:    BP 117/79   Pulse 97   Temp 98.8 F (37.1 C) (Oral)   Ht 5' 1.5" (1.562 m)   Wt 260 lb 12.8 oz (118.3 kg)   SpO2 98%   BMI 48.48 kg/m   Wt Readings from Last 3 Encounters:  06/19/22 260 lb 12.8 oz (118.3 kg)  04/04/22 258 lb 3.2 oz (117.1 kg)  02/17/22 261 lb 8 oz (118.6 kg)    Physical Exam Vitals and nursing note reviewed.  Constitutional:      General: She is not in acute distress.    Appearance: Normal appearance. She is obese. She is diaphoretic. She is not ill-appearing or toxic-appearing.  HENT:     Head: Normocephalic and atraumatic.     Right Ear: Tympanic membrane, ear canal and external ear normal.     Left Ear: External ear normal. Tympanic membrane is erythematous and bulging.     Nose: Congestion and rhinorrhea present.     Mouth/Throat:     Mouth: Mucous membranes are moist.     Pharynx: Oropharynx is clear. Posterior oropharyngeal erythema present. No oropharyngeal exudate.  Eyes:     General: No scleral icterus.       Right eye: No discharge.        Left eye: No discharge.  Extraocular Movements: Extraocular movements intact.     Conjunctiva/sclera: Conjunctivae normal.     Pupils: Pupils are equal, round, and reactive to light.  Cardiovascular:     Rate and Rhythm: Normal rate and regular rhythm.     Pulses: Normal pulses.     Heart sounds: Normal heart sounds. No murmur heard.    No friction rub. No gallop.  Pulmonary:     Effort: Pulmonary effort is normal. No respiratory distress.     Breath sounds: No stridor. Wheezing present. No rhonchi or rales.  Chest:     Chest wall: No tenderness.  Musculoskeletal:        General: Normal range of motion.     Cervical back: Normal range of motion and neck supple.   Skin:    General: Skin is warm.     Capillary Refill: Capillary refill takes less than 2 seconds.     Coloration: Skin is not jaundiced or pale.     Findings: No bruising, erythema, lesion or rash.  Neurological:     General: No focal deficit present.     Mental Status: She is alert and oriented to person, place, and time. Mental status is at baseline.  Psychiatric:        Mood and Affect: Mood normal.        Behavior: Behavior normal.        Thought Content: Thought content normal.        Judgment: Judgment normal.     Results for orders placed or performed in visit on 02/17/22  CBC with Differential/Platelet  Result Value Ref Range   WBC 11.0 (H) 3.4 - 10.8 x10E3/uL   RBC 4.77 3.77 - 5.28 x10E6/uL   Hemoglobin 13.4 11.1 - 15.9 g/dL   Hematocrit 16.1 09.6 - 46.6 %   MCV 86 79 - 97 fL   MCH 28.1 26.6 - 33.0 pg   MCHC 32.6 31.5 - 35.7 g/dL   RDW 04.5 40.9 - 81.1 %   Platelets 430 150 - 450 x10E3/uL   Neutrophils 67 Not Estab. %   Lymphs 24 Not Estab. %   Monocytes 6 Not Estab. %   Eos 1 Not Estab. %   Basos 1 Not Estab. %   Neutrophils Absolute 7.5 (H) 1.4 - 7.0 x10E3/uL   Lymphocytes Absolute 2.6 0.7 - 3.1 x10E3/uL   Monocytes Absolute 0.7 0.1 - 0.9 x10E3/uL   EOS (ABSOLUTE) 0.1 0.0 - 0.4 x10E3/uL   Basophils Absolute 0.1 0.0 - 0.2 x10E3/uL   Immature Granulocytes 1 Not Estab. %   Immature Grans (Abs) 0.1 0.0 - 0.1 x10E3/uL  Comprehensive metabolic panel  Result Value Ref Range   Glucose 94 70 - 99 mg/dL   BUN 13 6 - 20 mg/dL   Creatinine, Ser 9.14 0.57 - 1.00 mg/dL   eGFR 85 >78 GN/FAO/1.30   BUN/Creatinine Ratio 14 9 - 23   Sodium 136 134 - 144 mmol/L   Potassium 4.2 3.5 - 5.2 mmol/L   Chloride 103 96 - 106 mmol/L   CO2 20 20 - 29 mmol/L   Calcium 9.1 8.7 - 10.2 mg/dL   Total Protein 6.6 6.0 - 8.5 g/dL   Albumin 4.3 4.0 - 5.0 g/dL   Globulin, Total 2.3 1.5 - 4.5 g/dL   Albumin/Globulin Ratio 1.9 1.2 - 2.2   Bilirubin Total <0.2 0.0 - 1.2 mg/dL   Alkaline  Phosphatase 66 44 - 121 IU/L   AST 17 0 - 40 IU/L  ALT 19 0 - 32 IU/L  Lipid Panel w/o Chol/HDL Ratio  Result Value Ref Range   Cholesterol, Total 191 100 - 199 mg/dL   Triglycerides 952 (H) 0 - 149 mg/dL   HDL 53 >84 mg/dL   VLDL Cholesterol Cal 27 5 - 40 mg/dL   LDL Chol Calc (NIH) 132 (H) 0 - 99 mg/dL  Urinalysis, Routine w reflex microscopic  Result Value Ref Range   Specific Gravity, UA 1.020 1.005 - 1.030   pH, UA 7.5 5.0 - 7.5   Color, UA Yellow Yellow   Appearance Ur Clear Clear   Leukocytes,UA Negative Negative   Protein,UA Negative Negative/Trace   Glucose, UA Negative Negative   Ketones, UA Negative Negative   RBC, UA Negative Negative   Bilirubin, UA Negative Negative   Urobilinogen, Ur 0.2 0.2 - 1.0 mg/dL   Nitrite, UA Negative Negative   Microscopic Examination Comment   TSH  Result Value Ref Range   TSH 1.060 0.450 - 4.500 uIU/mL      Assessment & Plan:   Problem List Items Addressed This Visit       Respiratory   Asthma without status asthmaticus - Primary    Will treat with prednisone. Call with any concerns or if not getting better.       Relevant Medications   predniSONE (DELTASONE) 10 MG tablet   Other Visit Diagnoses     Acute non-recurrent maxillary sinusitis       Will treat with augmentin. Call with any concerns or if not getting better.   Relevant Medications   triamcinolone acetonide (KENALOG-40) injection 40 mg (Completed)   amoxicillin-clavulanate (AUGMENTIN) 875-125 MG tablet   predniSONE (DELTASONE) 10 MG tablet        Follow up plan: Return if symptoms worsen or fail to improve.

## 2022-06-19 NOTE — Assessment & Plan Note (Signed)
Will treat with prednisone. Call with any concerns or if not getting better.

## 2022-06-23 ENCOUNTER — Ambulatory Visit: Payer: BC Managed Care – PPO | Admitting: Family Medicine

## 2022-06-30 ENCOUNTER — Other Ambulatory Visit: Payer: Self-pay | Admitting: Family Medicine

## 2022-07-01 NOTE — Telephone Encounter (Signed)
Requested Prescriptions  Pending Prescriptions Disp Refills   fenofibrate (TRICOR) 145 MG tablet [Pharmacy Med Name: FENOFIBRATE 145MG  TABLETS] 90 tablet 3    Sig: TAKE 1 TABLET(145 MG) BY MOUTH DAILY     Cardiovascular:  Antilipid - Fibric Acid Derivatives Failed - 06/30/2022  3:20 AM      Failed - WBC in normal range and within 360 days    WBC  Date Value Ref Range Status  02/17/2022 11.0 (H) 3.4 - 10.8 x10E3/uL Final  11/06/2021 8.5 4.0 - 10.5 K/uL Final         Failed - Lipid Panel in normal range within the last 12 months    Cholesterol, Total  Date Value Ref Range Status  02/17/2022 191 100 - 199 mg/dL Final   LDL Chol Calc (NIH)  Date Value Ref Range Status  02/17/2022 111 (H) 0 - 99 mg/dL Final   HDL  Date Value Ref Range Status  02/17/2022 53 >39 mg/dL Final   Triglycerides  Date Value Ref Range Status  02/17/2022 155 (H) 0 - 149 mg/dL Final         Passed - ALT in normal range and within 360 days    ALT  Date Value Ref Range Status  02/17/2022 19 0 - 32 IU/L Final         Passed - AST in normal range and within 360 days    AST  Date Value Ref Range Status  02/17/2022 17 0 - 40 IU/L Final         Passed - Cr in normal range and within 360 days    Creatinine, Ser  Date Value Ref Range Status  02/17/2022 0.93 0.57 - 1.00 mg/dL Final         Passed - HGB in normal range and within 360 days    Hemoglobin  Date Value Ref Range Status  02/17/2022 13.4 11.1 - 15.9 g/dL Final         Passed - HCT in normal range and within 360 days    Hematocrit  Date Value Ref Range Status  02/17/2022 41.1 34.0 - 46.6 % Final         Passed - PLT in normal range and within 360 days    Platelets  Date Value Ref Range Status  02/17/2022 430 150 - 450 x10E3/uL Final         Passed - eGFR is 30 or above and within 360 days    GFR calc Af Amer  Date Value Ref Range Status  03/26/2020 139 >59 mL/min/1.73 Final    Comment:    **In accordance with recommendations  from the NKF-ASN Task force,**   [facility] is in the process of updating its eGFR calculation to the   2021 CKD-EPI creatinine equation that estimates kidney function   without a race variable.    GFR, Estimated  Date Value Ref Range Status  11/06/2021 >60 >60 mL/min Final    Comment:    (NOTE) Calculated using the CKD-EPI Creatinine Equation (2021)    eGFR  Date Value Ref Range Status  02/17/2022 85 >59 mL/min/1.73 Final         Passed - Valid encounter within last 12 months    Recent Outpatient Visits           1 week ago Moderate persistent asthma without status asthmaticus with acute exacerbation   [place] [place], [place] P, DO   2 months ago Viral gastroenteritis  [place] [facility] Mecum, Oswaldo Conroy, PA-C   4 months ago Routine general medical examination at a health care facility   [facility], [place] P, DO   5 months ago Pneumonia of both lower lobes due to infectious organism   [place] [facility] [place], [place] P, DO   6 months ago Moderate persistent asthma without status asthmaticus with acute exacerbation   [place] [facility] Com[place], Oralia Rud, DO       Future Appointments             In 1 month Hammock, Lavonna Rua, NP [facility] at [place]   In 1 month Dorcas Carrow, DO [facility], [facility]

## 2022-07-13 ENCOUNTER — Other Ambulatory Visit: Payer: Self-pay | Admitting: Family Medicine

## 2022-07-14 MED ORDER — OMEPRAZOLE 40 MG PO CPDR: DELAYED_RELEASE_CAPSULE | ORAL | 0 refills | Status: DC

## 2022-07-17 ENCOUNTER — Telehealth: Payer: Self-pay

## 2022-07-17 NOTE — Telephone Encounter (Signed)
-----   Message from Pablo Ledger, [place] sent at 07/14/2022  1:33 PM EDT ----- Patient needs TOC call completed please.

## 2022-07-17 NOTE — Transitions of Care (Post Inpatient/ED Visit) (Signed)
   07/17/2022  Name: Juliete Shawver McDonald-Border MRN: [id number] DOB: [birth year]  Today's TOC FU Call Status: Today's TOC FU Call Status:: Unsuccessul Call (1st Attempt) Unsuccessful Call (1st Attempt) Date: 07/17/22  Attempted to reach the patient regarding the most recent Inpatient/ED visit.  Follow Up Plan: Additional outreach attempts will be made to reach the patient to complete the Transitions of Care (Post Inpatient/ED visit) call.   Signature: Wilhemena Durie, CMA

## 2022-07-23 ENCOUNTER — Encounter: Payer: Self-pay | Admitting: Family Medicine

## 2022-08-05 ENCOUNTER — Ambulatory Visit: Payer: BC Managed Care – PPO | Admitting: Cardiology

## 2022-08-08 ENCOUNTER — Other Ambulatory Visit: Payer: Self-pay | Admitting: Cardiology

## 2022-08-08 ENCOUNTER — Other Ambulatory Visit: Payer: Self-pay | Admitting: Family Medicine

## 2022-08-11 NOTE — Telephone Encounter (Signed)
Requested Prescriptions  Pending Prescriptions Disp Refills   montelukast (SINGULAIR) 10 MG tablet [Pharmacy Med Name: MONTELUKAST 10MG  TABLETS] 90 tablet 1    Sig: TAKE 1 TABLET(10 MG) BY MOUTH AT BEDTIME     Pulmonology:  Leukotriene Inhibitors Passed - 08/08/2022  3:27 AM      Passed - Valid encounter within last 12 months    Recent Outpatient Visits           1 month ago Moderate persistent asthma without status asthmaticus with acute exacerbation   [place] [facility] [place], Megan P, DO   4 months ago Viral gastroenteritis   [place] [facility] [place], Oswaldo Conroy, PA-C   5 months ago Routine general medical examination at a health care facility   [facility] [place] P, DO   6 months ago Pneumonia of both lower lobes due to infectious organism   [place] [facility] [place], Megan P, DO   7 months ago Moderate persistent asthma without status asthmaticus with acute exacerbation   [place] [facility] [place], Oralia Rud, DO       Future Appointments             In 1 week Furth, Cadence H, PA-C [facility] [place]   In 2 weeks Laural Benes, Oralia Rud, DO [facility]

## 2022-08-18 ENCOUNTER — Ambulatory Visit: Payer: BC Managed Care – PPO | Admitting: Family Medicine

## 2022-08-20 ENCOUNTER — Ambulatory Visit: Payer: BC Managed Care – PPO | Attending: Cardiology | Admitting: Medical

## 2022-08-20 ENCOUNTER — Encounter: Payer: Self-pay | Admitting: Medical

## 2022-08-20 VITALS — BP 130/86 | HR 96 | Ht 61.0 in | Wt 271.4 lb

## 2022-08-20 DIAGNOSIS — F17210 Nicotine dependence, cigarettes, uncomplicated: Secondary | ICD-10-CM

## 2022-08-20 DIAGNOSIS — R000 Tachycardia, unspecified: Secondary | ICD-10-CM

## 2022-08-20 DIAGNOSIS — E781 Pure hyperglyceridemia: Secondary | ICD-10-CM

## 2022-08-20 MED ORDER — METOPROLOL SUCCINATE ER 25 MG PO TB24: ORAL_TABLET | ORAL | 3 refills | Status: DC

## 2022-08-20 NOTE — Patient Instructions (Signed)
Medication Instructions:  Increase Metoprolol to 25 mg daily   *If you need a refill on your cardiac medications before your next appointment, please call your pharmacy*   Follow-Up: At [facility], you and your health needs are our priority.  As part of our continuing mission to provide you with exceptional heart care, we have created designated Provider Care Teams.  These Care Teams include your primary Cardiologist (physician) and Advanced Practice Providers (APPs -  Physician Assistants and Nurse Practitioners) who all work together to provide you with the care you need, when you need it.  We recommend signing up for the patient portal called "MyChart".  Sign up information is provided on this After Visit Summary.  MyChart is used to connect with patients for Virtual Visits (Telemedicine).  Patients are able to view lab/test results, encounter notes, upcoming appointments, etc.  Non-urgent messages can be sent to your provider as well.   To learn more about what you can do with MyChart, go to [email or web address].    Your next appointment:   6 month(s)  Provider:   You may see Bryan Lemma, MD or one of the following Advanced Practice Providers on your designated Care Team:   Nicolasa Ducking, NP Eula Listen, PA-C Cadence Fransico Michael, PA-C Charlsie Quest, NP

## 2022-08-20 NOTE — Progress Notes (Signed)
Cardiology Office Note:    Date:  08/20/2022   ID:  [name], DOB 02-10-1991, MRN [id number]  PCP:  Dorcas Carrow, DO  [facility] Cardiologist:  Bryan Lemma, MD  [facility] Electrophysiologist:  None   Referring MD: Dorcas Carrow, DO   Chief Complaint: 4 month follow-up  History of Present Illness:    Mariah Gonzalez is a 32 y.o. female with a hx of tachycardia, social anxiety disorder, tobacco abuse, hypertriglyceridemia, family history of CAD, asthma, migraines, GERD, bipolar disorder who presents for follow-up.  She underwent exercise treadmill stress test which revealed inappropriate sinus tachycardia at rest but EKG was negative for findings of MI.  This was considered low risk but did reveal elevated heart rates.  He was seen in January 2024 is overall doing well.  Heart rate was 105 and she was started on Toprol.  Patient was last seen in February 2024 and was not feeling well.  Heart rate was 104 bpm.  Patient had possible GI bug.  Today, the patient is overall doing well. Heart rate today is 96bpm. She denies any symptoms, palpitations or heart racing. No chest pain or shortness in breath. No lower leg edema. She swims for activity.   Past Medical History:  Diagnosis Date   ADHD    Anxiety    Asthma    Depressed    DUB (dysfunctional uterine bleeding)    Dysfunctional uterine bleeding    GERD (gastroesophageal reflux disease)    High cholesterol    History of COVID-19 01/24/2021   Hypertension    IBS (irritable bowel syndrome)    IDA (iron deficiency anemia)    LGSIL on Pap smear of cervix    Migraine without aura and without status migrainosus, not intractable    Pelvic pain in female     Past Surgical History:  Procedure Laterality Date   debridement of rught eye     ESOPHAGOGASTRODUODENOSCOPY (EGD) WITH PROPOFOL N/A 08/05/2021   Procedure: ESOPHAGOGASTRODUODENOSCOPY (EGD) WITH PROPOFOL;  Surgeon: Toney Reil, MD;  Location: [facility] ENDOSCOPY;  Service: Gastroenterology;  Laterality: N/A;   gum grafting     TONSILLECTOMY Bilateral 07/03/2020   Procedure: TONSILLECTOMY;  Surgeon: Geanie Logan, MD;  Location: [facility];  Service: ENT;  Laterality: Bilateral;   WISDOM TOOTH EXTRACTION      Current Medications: Current Meds  Medication Sig   albuterol (PROVENTIL) (2.5 MG/3ML) 0.083% nebulizer solution Take 3 mLs (2.5 mg total) by nebulization every 6 (six) hours as needed for wheezing or shortness of breath.   albuterol (VENTOLIN HFA) 108 (90 Base) MCG/ACT inhaler INHALE 2 PUFFS INTO THE LUNGS EVERY 6 HOURS AS NEEDED FOR WHEEZING OR SHORTNESS OF BREATH   amphetamine-dextroamphetamine (ADDERALL XR) 10 MG 24 hr capsule Take 10 mg by mouth daily.   amphetamine-dextroamphetamine (ADDERALL) 5 MG tablet Take 1 tablet by mouth daily. Patient is taking 0.5 tablet   ARIPiprazole (ABILIFY) 10 MG tablet Take 10 mg by mouth daily.   baclofen (LIORESAL) 10 MG tablet TAKE 1/2 TO 1 TABLET(5 TO 10 MG) BY MOUTH THREE TIMES DAILY   BUPROPION HBR ER PO Take 1 tablet by mouth daily.   cyclobenzaprine (FLEXERIL) 10 MG tablet Take 1 tablet (10 mg total) by mouth at bedtime.   dextroamphetamine (DEXTROSTAT) 5 MG tablet Take by mouth.   fenofibrate (TRICOR) 145 MG tablet TAKE 1 TABLET(145 MG) BY MOUTH DAILY   gabapentin (NEURONTIN) 100 MG capsule Take 200 mg by mouth  at bedtime.   hydrocortisone cream 1 % Apply 1 application. topically 2 (two) times daily.   lamoTRIgine (LAMICTAL) 200 MG tablet TAKE 1 TABLET(200 MG) BY MOUTH DAILY   lamoTRIgine (LAMICTAL) 25 MG tablet TAKE 2 TABLETS BY MOUTH EVERY DAY. TO BE COMBINED WITH 200MG  EVERY DAY AT BEDTIME   lidocaine (XYLOCAINE) 2 % solution SMARTSIG:Milliliter(s) Topical Daily PRN   LORazepam (ATIVAN) 0.5 MG tablet Take 1 tablet (0.5 mg total) by mouth as directed. Take 1 tablet 1-2 times a day as needed for severe anxiety attacks only - please limit use    MELATONIN PO Take by mouth.   montelukast (SINGULAIR) 10 MG tablet TAKE 1 TABLET(10 MG) BY MOUTH AT BEDTIME   omeprazole (PRILOSEC) 40 MG capsule TAKE 1 CAPSULE(40 MG) BY MOUTH DAILY   ondansetron (ZOFRAN) 4 MG tablet Take 1 tablet (4 mg total) by mouth every 8 (eight) hours as needed for nausea or vomiting.   predniSONE (DELTASONE) 10 MG tablet 6 tabs in the AM days 1 and 2, 5 tabs days 3 and 4, decrease by 1 every other day until gone   sucralfate (CARAFATE) 1 g tablet TAKE 1 TABLET(1 GRAM) BY MOUTH FOUR TIMES DAILY AT BEDTIME AND WITH MEALS   traZODone (DESYREL) 50 MG tablet TAKE 2 TABLETS(100 MG) BY MOUTH AT BEDTIME   TRELEGY ELLIPTA 100-62.5-25 MCG/ACT AEPB INHALE 1 PUFF INTO THE LUNGS DAILY   triamcinolone cream (KENALOG) 0.1 % Apply 1 Application topically 2 (two) times daily.   [DISCONTINUED] metoprolol succinate (TOPROL-XL) 25 MG 24 hr tablet TAKE 1/2 TABLET(12.5 MG) BY MOUTH DAILY WITH SUPPER     Allergies:   Egg-derived products   Social History   Socioeconomic History   Marital status: Married    Spouse name: Not on file   Number of children: Not on file   Years of education: Not on file   Highest education level: Bachelor's degree (e.g., BA, AB, BS)  Occupational History   Occupation: Runner, broadcasting/film/video - for [employer]    Comment: Sign Language -  Tobacco Use   Smoking status: Every Day    Packs/day: 0.50    Years: 2.00    Additional pack years: 0.00    Total pack years: 1.00    Types: Cigarettes    Passive exposure: Never   Smokeless tobacco: Never  Vaping Use   Vaping Use: Never used  Substance and Sexual Activity   Alcohol use: Yes    Comment: occasionally   Drug use: Yes    Types: Marijuana, Hydromorphone    Comment: Delta 8- chewed a gummy on 02/13/2022   Sexual activity: Yes  Other Topics Concern   Not on file  Social History Narrative   Not on file   Social Determinants of Health   Financial Resource Strain: Not on file  Food Insecurity: Not on file   Transportation Needs: Not on file  Physical Activity: Not on file  Stress: Not on file  Social Connections: Not on file     Family History: The patient's family history includes Breast cancer in her maternal aunt, maternal aunt, and paternal aunt; Cancer in her maternal uncle and paternal uncle; Coronary artery disease in her father; Coronary artery disease (age of onset: 44) in her half-brother; Depression in her mother; Diabetes in her paternal grandfather and paternal uncle; Diabetes Mellitus II in her father; Hearing loss in her paternal aunt and paternal uncle; Heart attack (age of onset: 35) in her paternal grandfather; Heart attack (age of  onset: 75) in her half-brother; Heart attack (age of onset: 24) in her father; Hyperlipidemia in her father and mother; Hypertension in her brother, father, and mother; Melanoma in her father; Mental illness in her father; Stroke in her father; Stroke (age of onset: 78) in her half-brother.  ROS:   Please see the history of present illness.     All other systems reviewed and are negative.  EKGs/Labs/Other Studies Reviewed:    The following studies were reviewed today:  ETT 02/2022   Inappropriate sinus tachycardia at rest   ETT negative for ischemia  Heart monitor 01/2022   ~7 Day Zio Patch Results:   Predominant Rhythm was Normal Sinus Rhythm - rate range 59-160 beats per min.   Rare premature atrial beats and premature ventricular beats noted (<1%)   No sustained or nonsustained arrhythmias/abnormal heart rhythms.   Symptoms noted with sinus tachycardia.   Overall relatively benign monitor.  Symptoms noted with fast heart rate that was normal rhythm going fast. No nonsustained or sustained abnormal heart rhythms.   EKG:  EKG is ordered today.  The ekg ordered today demonstrates NSR 96bpm, nonspecific T wave changes  Recent Labs: 02/17/2022: ALT 19; BUN 13; Creatinine, Ser 0.93; Hemoglobin 13.4; Platelets 430; Potassium 4.2; Sodium 136; TSH  1.060  Recent Lipid Panel    Component Value Date/Time   CHOL 191 02/17/2022 1001   TRIG 155 (H) 02/17/2022 1001   HDL 53 02/17/2022 1001   LDLCALC 111 (H) 02/17/2022 1001     Physical Exam:    VS:  BP 130/86 (BP Location: Left Arm, Patient Position: Sitting, Cuff Size: Large)   Pulse 96   Ht 5\' 1"  (1.549 m)   Wt 271 lb 6.4 oz (123.1 kg)   SpO2 97%   BMI 51.28 kg/m     Wt Readings from Last 3 Encounters:  08/20/22 271 lb 6.4 oz (123.1 kg)  06/19/22 260 lb 12.8 oz (118.3 kg)  04/04/22 258 lb 3.2 oz (117.1 kg)     GEN:  Well nourished, well developed in no acute distress HEENT: Normal NECK: No JVD; No carotid bruits LYMPHATICS: No lymphadenopathy CARDIAC: RRR, no murmurs, rubs, gallops RESPIRATORY:  Clear to auscultation without rales, wheezing or rhonchi  ABDOMEN: Soft, non-tender, non-distended MUSCULOSKELETAL:  No edema; No deformity  SKIN: Warm and dry NEUROLOGIC:  Alert and oriented x 3 PSYCHIATRIC:  Normal affect   ASSESSMENT:    1. Tachycardia   2. Hypertriglyceridemia   3. Nicotine dependence, cigarettes, uncomplicated    PLAN:    In order of problems listed above:  Sinus tachycardia Resolved on Toprol. EKG today shows heart rate of 96bpm. She is overall asymptomatic. I will increase Toprol to 25mg  daily.   Hypertriglyceridemia LDL 111, HDL 53, TG 155, total chol 191. Continue fenofibrate.  Tobacco use She is still trying to quit.   Disposition: Follow up in 6 month(s) with MD/APP    Signed, Heiley Shaikh David Stall, PA-C  08/20/2022 12:28 PM    [facility]

## 2022-08-26 ENCOUNTER — Encounter: Payer: Self-pay | Admitting: Family Medicine

## 2022-08-26 ENCOUNTER — Ambulatory Visit
Admission: RE | Admit: 2022-08-26 | Discharge: 2022-08-26 | Disposition: A | Discharge status: Home / Self Care | Payer: BC Managed Care – PPO | Attending: Family Medicine | Admitting: Family Medicine

## 2022-08-26 ENCOUNTER — Ambulatory Visit
Admission: RE | Admit: 2022-08-26 | Discharge: 2022-08-26 | Disposition: A | Discharge status: Home / Self Care | Payer: BC Managed Care – PPO | Source: Ambulatory Visit | Attending: Family Medicine | Admitting: Family Medicine

## 2022-08-26 ENCOUNTER — Ambulatory Visit: Payer: BC Managed Care – PPO | Admitting: Family Medicine

## 2022-08-26 VITALS — BP 136/68 | HR 80 | Temp 98.4°F | Wt 269.4 lb

## 2022-08-26 DIAGNOSIS — M25551 Pain in right hip: Secondary | ICD-10-CM | POA: Diagnosis not present

## 2022-08-26 DIAGNOSIS — M25552 Pain in left hip: Secondary | ICD-10-CM | POA: Diagnosis not present

## 2022-08-26 DIAGNOSIS — E781 Pure hyperglyceridemia: Secondary | ICD-10-CM | POA: Diagnosis not present

## 2022-08-26 DIAGNOSIS — F339 Major depressive disorder, recurrent, unspecified: Secondary | ICD-10-CM

## 2022-08-26 DIAGNOSIS — F3131 Bipolar disorder, current episode depressed, mild: Secondary | ICD-10-CM

## 2022-08-26 DIAGNOSIS — J4540 Moderate persistent asthma, uncomplicated: Secondary | ICD-10-CM

## 2022-08-26 DIAGNOSIS — J45909 Unspecified asthma, uncomplicated: Secondary | ICD-10-CM

## 2022-08-26 MED ORDER — NAPROXEN 500 MG PO TABS: 500.0000 mg | ORAL_TABLET | Freq: Two times a day (BID) | ORAL | 1 refills | Status: DC

## 2022-08-26 MED ORDER — ALBUTEROL SULFATE HFA 108 (90 BASE) MCG/ACT IN AERS: 2.0000 | INHALATION_SPRAY | Freq: Four times a day (QID) | RESPIRATORY_TRACT | 3 refills | Status: DC | PRN

## 2022-08-26 MED ORDER — OMEPRAZOLE 40 MG PO CPDR: DELAYED_RELEASE_CAPSULE | ORAL | 1 refills | Status: DC

## 2022-08-26 MED ORDER — SUCRALFATE 1 G PO TABS: 1.0000 g | ORAL_TABLET | Freq: Three times a day (TID) | ORAL | 2 refills | Status: DC

## 2022-08-26 MED ORDER — TRELEGY ELLIPTA 100-62.5-25 MCG/ACT IN AEPB: 1.0000 | INHALATION_SPRAY | Freq: Every day | RESPIRATORY_TRACT | 12 refills | Status: DC

## 2022-08-26 NOTE — Assessment & Plan Note (Signed)
Not doing great right now. Following with [place] [facility]. Will reach out to them. Call with any concerns.

## 2022-08-26 NOTE — Assessment & Plan Note (Signed)
 Under good control on current regimen. Continue current regimen. Continue to monitor. Call with any concerns. Refills given. Labs drawn today.   

## 2022-08-26 NOTE — Progress Notes (Signed)
BP 136/68   Pulse 80   Temp 98.4 F (36.9 C) (Oral)   Wt 269 lb 6.4 oz (122.2 kg)   LMP 07/27/2022 (Exact Date)   SpO2 97%   BMI 50.90 kg/m    Subjective:    Patient ID: Mariah Gonzalez, female    DOB: [birth year], 32 y.o.   MRN: [id number]  HPI: Mariah Gonzalez is a 32 y.o. female  Chief Complaint  Patient presents with   Depression   Asthma   HIP PAIN Duration: over a month ago Involved hip: right  Mechanism of injury: unknown Location: lateral Onset: gradual  Severity: moderate  Quality: sharp and dull and aching Frequency: intermittent Radiation: yes Aggravating factors: laying on it, exercising   Alleviating factors: naproxen, rest  Status: worse Treatments attempted: rest, ice, heat, APAP, and aleve   Relief with NSAIDs?: mild Weakness with weight bearing: no Weakness with walking: no Paresthesias / decreased sensation: no Swelling: no Redness:no Fevers: no  ASTHMA Asthma status: stable Satisfied with current treatment?: yes Albuterol/rescue inhaler frequency: occasionally Dyspnea frequency: occasionally Wheezing frequency: occasionally Cough frequency: occasionally Nocturnal symptom frequency: none  Limitation of activity: no Current upper respiratory symptoms: no Aerochamber/spacer use: no Visits to ER or Urgent Care in past year: no Pneumovax: Up to Date Influenza: Up to Date  HYPERLIPIDEMIA Hyperlipidemia status: excellent compliance Satisfied with current treatment?  yes Side effects:  no Medication compliance: excellent compliance Past cholesterol meds: fenofibrate Supplements: none Aspirin:  no The ASCVD Risk score (Arnett DK, et al., 2019) failed to calculate for the following reasons:   The 2019 ASCVD risk score is only valid for ages 32 to 26 Chest pain:  no Coronary artery disease:  no Family history CAD:  yes  DEPRESSION/BIPOLAR Mood status: exacerbated Satisfied with current treatment?: no Symptom  severity: moderate  Duration of current treatment : chronic Side effects: no Medication compliance: excellent compliance Psychotherapy/counseling: yes current Depressed mood: yes Anxious mood: yes Anhedonia: no Significant weight loss or gain: yes Insomnia: no  Fatigue: yes Feelings of worthlessness or guilt: yes Impaired concentration/indecisiveness: yes Suicidal ideations: no Hopelessness: no Crying spells: no    08/26/2022    8:49 AM 06/19/2022    8:45 AM 04/04/2022   10:47 AM 02/17/2022    9:54 AM 01/17/2022   10:00 AM  Depression screen PHQ 2/9  Decreased Interest 3 2 2 1 1   Down, Depressed, Hopeless 3 2 2 1 1   PHQ - 2 Score 6 4 4 2 2   Altered sleeping 3 2 2 1 3   Tired, decreased energy 3 3 2 1 3   Change in appetite 3 3 2 1 2   Feeling bad or failure about yourself  1 1 0 0 1  Trouble concentrating 2 2 1 1 1   Moving slowly or fidgety/restless 0 3 1 0 1  Suicidal thoughts 0 0 0 0 0  PHQ-9 Score 18 18 12 6 13   Difficult doing work/chores Very difficult Very difficult Very difficult Not difficult at all Very difficult     Relevant past medical, surgical, family and social history reviewed and updated as indicated. Interim medical history since our last visit reviewed. Allergies and medications reviewed and updated.  Review of Systems  Constitutional: Negative.   Respiratory: Negative.    Cardiovascular: Negative.   Gastrointestinal: Negative.   Musculoskeletal:  Positive for arthralgias and myalgias. Negative for back pain, gait problem, joint swelling, neck pain and neck stiffness.  Skin: Negative.   Neurological: Negative.  Psychiatric/Behavioral: Negative.      Per HPI unless specifically indicated above     Objective:    BP 136/68   Pulse 80   Temp 98.4 F (36.9 C) (Oral)   Wt 269 lb 6.4 oz (122.2 kg)   LMP 07/27/2022 (Exact Date)   SpO2 97%   BMI 50.90 kg/m   Wt Readings from Last 3 Encounters:  08/26/22 269 lb 6.4 oz (122.2 kg)  08/20/22 271 lb  6.4 oz (123.1 kg)  06/19/22 260 lb 12.8 oz (118.3 kg)    Physical Exam Vitals and nursing note reviewed.  Constitutional:      General: She is not in acute distress.    Appearance: Normal appearance. She is obese. She is not ill-appearing, toxic-appearing or diaphoretic.  HENT:     Head: Normocephalic and atraumatic.     Right Ear: External ear normal.     Left Ear: External ear normal.     Nose: Nose normal.     Mouth/Throat:     Mouth: Mucous membranes are moist.     Pharynx: Oropharynx is clear.  Eyes:     General: No scleral icterus.       Right eye: No discharge.        Left eye: No discharge.     Extraocular Movements: Extraocular movements intact.     Conjunctiva/sclera: Conjunctivae normal.     Pupils: Pupils are equal, round, and reactive to light.  Cardiovascular:     Rate and Rhythm: Normal rate and regular rhythm.     Pulses: Normal pulses.     Heart sounds: Normal heart sounds. No murmur heard.    No friction rub. No gallop.  Pulmonary:     Effort: Pulmonary effort is normal. No respiratory distress.     Breath sounds: Normal breath sounds. No stridor. No wheezing, rhonchi or rales.  Chest:     Chest wall: No tenderness.  Musculoskeletal:        General: Normal range of motion.     Cervical back: Normal range of motion and neck supple.  Skin:    General: Skin is warm and dry.     Capillary Refill: Capillary refill takes less than 2 seconds.     Coloration: Skin is not jaundiced or pale.     Findings: No bruising, erythema, lesion or rash.  Neurological:     General: No focal deficit present.     Mental Status: She is alert and oriented to person, place, and time. Mental status is at baseline.  Psychiatric:        Mood and Affect: Mood normal.        Behavior: Behavior normal.        Thought Content: Thought content normal.        Judgment: Judgment normal.     Results for orders placed or performed in visit on 02/17/22  CBC with Differential/Platelet   Result Value Ref Range   WBC 11.0 (H) 3.4 - 10.8 x10E3/uL   RBC 4.77 3.77 - 5.28 x10E6/uL   Hemoglobin 13.4 11.1 - 15.9 g/dL   Hematocrit 78.2 95.6 - 46.6 %   MCV 86 79 - 97 fL   MCH 28.1 26.6 - 33.0 pg   MCHC 32.6 31.5 - 35.7 g/dL   RDW 21.3 08.6 - 57.8 %   Platelets 430 150 - 450 x10E3/uL   Neutrophils 67 Not Estab. %   Lymphs 24 Not Estab. %   Monocytes 6 Not Estab. %  Eos 1 Not Estab. %   Basos 1 Not Estab. %   Neutrophils Absolute 7.5 (H) 1.4 - 7.0 x10E3/uL   Lymphocytes Absolute 2.6 0.7 - 3.1 x10E3/uL   Monocytes Absolute 0.7 0.1 - 0.9 x10E3/uL   EOS (ABSOLUTE) 0.1 0.0 - 0.4 x10E3/uL   Basophils Absolute 0.1 0.0 - 0.2 x10E3/uL   Immature Granulocytes 1 Not Estab. %   Immature Grans (Abs) 0.1 0.0 - 0.1 x10E3/uL  Comprehensive metabolic panel  Result Value Ref Range   Glucose 94 70 - 99 mg/dL   BUN 13 6 - 20 mg/dL   Creatinine, Ser 1.61 0.57 - 1.00 mg/dL   eGFR 85 >09 UE/AVW/0.98   BUN/Creatinine Ratio 14 9 - 23   Sodium 136 134 - 144 mmol/L   Potassium 4.2 3.5 - 5.2 mmol/L   Chloride 103 96 - 106 mmol/L   CO2 20 20 - 29 mmol/L   Calcium 9.1 8.7 - 10.2 mg/dL   Total Protein 6.6 6.0 - 8.5 g/dL   Albumin 4.3 4.0 - 5.0 g/dL   Globulin, Total 2.3 1.5 - 4.5 g/dL   Albumin/Globulin Ratio 1.9 1.2 - 2.2   Bilirubin Total <0.2 0.0 - 1.2 mg/dL   Alkaline Phosphatase 66 44 - 121 IU/L   AST 17 0 - 40 IU/L   ALT 19 0 - 32 IU/L  Lipid Panel w/o Chol/HDL Ratio  Result Value Ref Range   Cholesterol, Total 191 100 - 199 mg/dL   Triglycerides 119 (H) 0 - 149 mg/dL   HDL 53 >14 mg/dL   VLDL Cholesterol Cal 27 5 - 40 mg/dL   LDL Chol Calc (NIH) 782 (H) 0 - 99 mg/dL  Urinalysis, Routine w reflex microscopic  Result Value Ref Range   Specific Gravity, UA 1.020 1.005 - 1.030   pH, UA 7.5 5.0 - 7.5   Color, UA Yellow Yellow   Appearance Ur Clear Clear   Leukocytes,UA Negative Negative   Protein,UA Negative Negative/Trace   Glucose, UA Negative Negative   Ketones, UA Negative  Negative   RBC, UA Negative Negative   Bilirubin, UA Negative Negative   Urobilinogen, Ur 0.2 0.2 - 1.0 mg/dL   Nitrite, UA Negative Negative   Microscopic Examination Comment   TSH  Result Value Ref Range   TSH 1.060 0.450 - 4.500 uIU/mL      Assessment & Plan:   Problem List Items Addressed This Visit       Respiratory   Asthma without status asthmaticus - Primary    Under good control on current regimen. Continue current regimen. Continue to monitor. Call with any concerns. Refills given. Labs drawn today.       Relevant Medications   Fluticasone-Umeclidin-Vilant (TRELEGY ELLIPTA) 100-62.5-25 MCG/ACT AEPB   albuterol (VENTOLIN HFA) 108 (90 Base) MCG/ACT inhaler   Other Relevant Orders   CBC with Differential/Platelet     Other   Hypertriglyceridemia (Chronic)    Under good control on current regimen. Continue current regimen. Continue to monitor. Call with any concerns. Refills given. Labs drawn today.        Relevant Orders   Lipid Panel w/o Chol/HDL Ratio   Comprehensive metabolic panel   Depression, recurrent (HCC)    Not doing great right now. Following with [place] [facility]. Will reach out to them. Call with any concerns.       Relevant Medications   buPROPion ER (WELLBUTRIN SR) 100 MG 12 hr tablet   Other Relevant Orders   TSH  Bipolar 1 disorder, depressed, mild (HCC)    Not doing great right now. Following with [place] [facility]. Will reach out to them. Call with any concerns.       Relevant Orders   [id number] 11+Oxyco+Alc+Crt-Bund   TSH   Other Visit Diagnoses     Bilateral hip pain       Will start naproxen, stretches and obtain x-rays. Call if not getting better or getting worse.   Relevant Orders   DG Hip Unilat W OR W/O Pelvis 2-3 Views Right   DG Hip Unilat W OR W/O Pelvis 2-3 Views Left   DG Lumbar Spine Complete        Follow up plan: Return in about 6 months (around 02/26/2023) for physical.

## 2022-08-27 LAB — COMPREHENSIVE METABOLIC PANEL
ALT: 22 IU/L (ref 0–32)
AST: 18 IU/L (ref 0–40)
Albumin: 4.4 g/dL (ref 4.0–5.0)
Alkaline Phosphatase: 58 IU/L (ref 44–121)
BUN/Creatinine Ratio: 14 (ref 9–23)
BUN: 15 mg/dL (ref 6–20)
Bilirubin Total: 0.2 mg/dL (ref 0.0–1.2)
CO2: 22 mmol/L (ref 20–29)
Calcium: 9.2 mg/dL (ref 8.7–10.2)
Chloride: 103 mmol/L (ref 96–106)
Creatinine, Ser: 1.05 mg/dL — ABNORMAL HIGH (ref 0.57–1.00)
Globulin, Total: 2 g/dL (ref 1.5–4.5)
Glucose: 101 mg/dL — ABNORMAL HIGH (ref 70–99)
Potassium: 4.8 mmol/L (ref 3.5–5.2)
Sodium: 138 mmol/L (ref 134–144)
Total Protein: 6.4 g/dL (ref 6.0–8.5)
eGFR: 73 mL/min/{1.73_m2} (ref >59)

## 2022-08-27 LAB — LIPID PANEL W/O CHOL/HDL RATIO
Cholesterol, Total: 175 mg/dL (ref 100–199)
HDL: 49 mg/dL (ref >39)
LDL Chol Calc (NIH): 105 mg/dL — ABNORMAL HIGH (ref 0–99)
Triglycerides: 116 mg/dL (ref 0–149)
VLDL Cholesterol Cal: 21 mg/dL (ref 5–40)

## 2022-08-27 LAB — CBC WITH DIFFERENTIAL/PLATELET
Basophils Absolute: 0.1 10*3/uL (ref 0.0–0.2)
Basos: 1 %
EOS (ABSOLUTE): 0.1 10*3/uL (ref 0.0–0.4)
Eos: 1 %
Hematocrit: 41.4 % (ref 34.0–46.6)
Hemoglobin: 13.6 g/dL (ref 11.1–15.9)
Immature Grans (Abs): 0 10*3/uL (ref 0.0–0.1)
Immature Granulocytes: 0 %
Lymphocytes Absolute: 3.1 10*3/uL (ref 0.7–3.1)
Lymphs: 31 %
MCH: 27.8 pg (ref 26.6–33.0)
MCHC: 32.9 g/dL (ref 31.5–35.7)
MCV: 85 fL (ref 79–97)
Monocytes Absolute: 0.8 10*3/uL (ref 0.1–0.9)
Monocytes: 8 %
Neutrophils Absolute: 5.8 10*3/uL (ref 1.4–7.0)
Neutrophils: 59 %
Platelets: 415 10*3/uL (ref 150–450)
RBC: 4.9 x10E6/uL (ref 3.77–5.28)
RDW: 13.9 % (ref 11.7–15.4)
WBC: 9.8 10*3/uL (ref 3.4–10.8)

## 2022-08-27 LAB — TSH: TSH: 2.15 u[IU]/mL (ref 0.450–4.500)

## 2022-08-29 LAB — DRUG SCREEN 764883 11+OXYCO+ALC+CRT-BUND
Amphetamines, Urine: NEGATIVE ng/mL
BENZODIAZ UR QL: NEGATIVE ng/mL
Barbiturate: NEGATIVE ng/mL
Cocaine (Metabolite): NEGATIVE ng/mL
Creatinine: 68.6 mg/dL (ref 20.0–300.0)
Ethanol: NEGATIVE %
Meperidine: NEGATIVE ng/mL
Methadone Screen, Urine: NEGATIVE ng/mL
OPIATE SCREEN URINE: NEGATIVE ng/mL
Oxycodone/Oxymorphone, Urine: NEGATIVE ng/mL
Phencyclidine: NEGATIVE ng/mL
Propoxyphene: NEGATIVE ng/mL
Tramadol: NEGATIVE ng/mL
pH, Urine: 7.2 (ref 4.5–8.9)

## 2022-08-29 LAB — CANNABINOID CONFIRMATION, UR
CANNABINOIDS: POSITIVE — AB
Carboxy THC GC/MS Conf: 177 ng/mL

## 2022-09-01 NOTE — Progress Notes (Signed)
Contacted via MyChart   Good afternoon Mariah Gonzalez, your imaging returned and there are no acute findings present and no arthritic changes.  Any questions?

## 2022-09-05 ENCOUNTER — Other Ambulatory Visit: Payer: Self-pay | Admitting: Family Medicine

## 2022-09-05 ENCOUNTER — Encounter: Payer: Self-pay | Admitting: Family Medicine

## 2022-09-05 DIAGNOSIS — M25551 Pain in right hip: Secondary | ICD-10-CM

## 2022-09-05 DIAGNOSIS — M4726 Other spondylosis with radiculopathy, lumbar region: Secondary | ICD-10-CM

## 2022-10-13 ENCOUNTER — Other Ambulatory Visit: Payer: Self-pay | Admitting: Family Medicine

## 2022-10-15 NOTE — Telephone Encounter (Signed)
Requested Prescriptions  Pending Prescriptions Disp Refills   omeprazole (PRILOSEC) 40 MG capsule [Pharmacy Med Name: OMEPRAZOLE 40MG  CAPSULES] 90 capsule 0    Sig: TAKE 1 CAPSULE(40 MG) BY MOUTH DAILY     Gastroenterology: Proton Pump Inhibitors Passed - 10/13/2022  9:29 AM      Passed - Valid encounter within last 12 months    Recent Outpatient Visits           1 month ago Moderate persistent asthma without status asthmaticus without complication   [place] [facility] [place], Megan P, DO   3 months ago Moderate persistent asthma without status asthmaticus with acute exacerbation   [place] [facility] [place], Megan P, DO   6 months ago Viral gastroenteritis   [place] [facility] M[place], Oswaldo Conroy, PA-C   8 months ago Routine general medical examination at a health care facility   [facility], Megan P, DO   9 months ago Pneumonia of both lower lobes due to infectious organism   [place] [facility] Dorcas Carrow, DO       Future Appointments             In 4 months Laural Benes, Oralia Rud, DO [place] [facility]

## 2022-11-15 ENCOUNTER — Other Ambulatory Visit: Payer: Self-pay | Admitting: Family Medicine

## 2022-11-17 NOTE — Telephone Encounter (Signed)
Requested Prescriptions  Refused Prescriptions Disp Refills   TRELEGY ELLIPTA 100-62.5-25 MCG/ACT AEPB [Pharmacy Med Name: TRELEGY ELLIPTA 100-62. INH 30P] 60 each 12    Sig: INHALE 1 PUFF INTO THE LUNGS DAILY     Off-Protocol Failed - 11/15/2022  9:32 AM      Failed - Medication not assigned to a protocol, review manually.      Passed - Valid encounter within last 12 months    Recent Outpatient Visits           2 months ago Moderate persistent asthma without status asthmaticus without complication   [place] [facility] [place], Megan P, DO   5 months ago Moderate persistent asthma without status asthmaticus with acute exacerbation   [place] [facility] [place], Megan P, DO   7 months ago Viral gastroenteritis   [place] [facility] Mecum, Oswaldo Conroy, PA-C   9 months ago Routine general medical examination at a health care facility   [facility], Megan P, DO   10 months ago Pneumonia of both lower lobes due to infectious organism   [place] [facility] Dorcas Carrow, DO       Future Appointments             In 3 months Laural Benes, Oralia Rud, DO [place] [facility]

## 2022-11-30 ENCOUNTER — Other Ambulatory Visit: Payer: Self-pay | Admitting: Family Medicine

## 2022-12-02 NOTE — Telephone Encounter (Signed)
Requested Prescriptions  Pending Prescriptions Disp Refills   sucralfate (CARAFATE) 1 g tablet [Pharmacy Med Name: SUCRALFATE 1GM TABLETS] 360 tablet 0    Sig: TAKE 1 TABLET(1 GRAM) BY MOUTH FOUR TIMES DAILY AT BEDTIME WITH MEALS     Gastroenterology: Antiacids Passed - 11/30/2022  2:34 PM      Passed - Valid encounter within last 12 months    Recent Outpatient Visits           3 months ago Moderate persistent asthma without status asthmaticus without complication   [place] [facility] [place], Megan P, DO   5 months ago Moderate persistent asthma without status asthmaticus with acute exacerbation   [place] [facility] [place], Megan P, DO   8 months ago Viral gastroenteritis   [place] [facility] Mecum, Oswaldo Conroy, PA-C   9 months ago Routine general medical examination at a health care facility   [facility], Megan P, DO   10 months ago Pneumonia of both lower lobes due to infectious organism   [place] [facility] Dorcas Carrow, DO       Future Appointments             In 2 months Laural Benes, Oralia Rud, DO  [facility], [facility]

## 2023-01-18 ENCOUNTER — Other Ambulatory Visit: Payer: Self-pay | Admitting: Family Medicine

## 2023-01-19 MED ORDER — OMEPRAZOLE 40 MG PO CPDR: DELAYED_RELEASE_CAPSULE | ORAL | 0 refills | Status: DC

## 2023-01-19 NOTE — Telephone Encounter (Signed)
[facility] Medication Refill Request  Last non-acute appointment needs to be within 12 months YES  Last office visit at [facility]: 11/30/2022   Next office visit at [facility]: 02/06/2023

## 2023-02-05 ENCOUNTER — Encounter: Payer: BC Managed Care – PPO | Admitting: Family Medicine

## 2023-02-06 ENCOUNTER — Encounter: Payer: BC Managed Care – PPO | Admitting: Family Medicine

## 2023-02-06 ENCOUNTER — Ambulatory Visit: Payer: BC Managed Care – PPO | Admitting: Family Medicine

## 2023-02-06 ENCOUNTER — Encounter: Payer: Self-pay | Admitting: Family Medicine

## 2023-02-06 VITALS — BP 135/87 | HR 99 | Temp 98.4°F | Ht 61.0 in | Wt 242.8 lb

## 2023-02-06 DIAGNOSIS — J189 Pneumonia, unspecified organism: Secondary | ICD-10-CM

## 2023-02-06 MED ORDER — PREDNISONE 10 MG PO TABS
ORAL_TABLET | ORAL | 0 refills | Status: DC
Start: 2023-02-06 — End: 2023-02-27

## 2023-02-06 MED ORDER — AZITHROMYCIN 250 MG PO TABS: ORAL_TABLET | ORAL | 0 refills | Status: AC

## 2023-02-06 NOTE — Progress Notes (Signed)
BP 135/87 (BP Location: Left Arm, Patient Position: Sitting, Cuff Size: Large)   Pulse 99   Temp 98.4 F (36.9 C) (Oral)   Ht 5\' 1"  (1.549 m)   Wt 242 lb 12.8 oz (110.1 kg)   SpO2 99%   BMI 45.88 kg/m    Subjective:    Patient ID: Mariah Gonzalez, female    DOB: February 23, 1990, 32 y.o.   MRN: [id number]  HPI: Mariah Gonzalez is a 32 y.o. female  Chief Complaint  Patient presents with   Nasal Congestion    Runny nose, not feeling well,    Cough   Sore Throat    Started 12/18, Taking OTC medication and nebulizer as needed    Generalized Body Aches   UPPER RESPIRATORY TRACT INFECTION Duration: About 2 weeks Worst symptom: cough Fever: yes Cough: yes Shortness of breath: yes Wheezing: yes Chest pain: yes Chest tightness: yes Chest congestion: yes Nasal congestion: yes Runny nose: yes Post nasal drip: yes Sneezing: yes Sore throat: yes Swollen glands: no Sinus pressure: yes Headache: yes Face pain: no Toothache: no Ear pain: no  Ear pressure: no  Eyes red/itching:no Eye drainage/crusting: no  Vomiting: no Rash: no Fatigue: yes Sick contacts: yes Strep contacts: no  Context: worse Recurrent sinusitis: no Relief with OTC cold/cough medications: no  Treatments attempted: cold/sinus, mucinex, anti-histamine, pseudoephedrine, and cough syrup   Relevant past medical, surgical, family and social history reviewed and updated as indicated. Interim medical history since our last visit reviewed. Allergies and medications reviewed and updated.  Review of Systems  Constitutional:  Positive for chills, diaphoresis, fatigue and fever. Negative for activity change, appetite change and unexpected weight change.  HENT:  Positive for congestion, postnasal drip, rhinorrhea, sinus pressure, sinus pain and sore throat. Negative for dental problem, drooling, ear discharge, ear pain, facial swelling, hearing loss, mouth sores, nosebleeds, sneezing, tinnitus,  trouble swallowing and voice change.   Eyes: Negative.   Respiratory:  Positive for cough. Negative for apnea, choking, chest tightness, shortness of breath, wheezing and stridor.   Cardiovascular: Negative.   Gastrointestinal: Negative.   Musculoskeletal: Negative.   Neurological: Negative.   Psychiatric/Behavioral: Negative.      Per HPI unless specifically indicated above     Objective:    BP 135/87 (BP Location: Left Arm, Patient Position: Sitting, Cuff Size: Large)   Pulse 99   Temp 98.4 F (36.9 C) (Oral)   Ht 5\' 1"  (1.549 m)   Wt 242 lb 12.8 oz (110.1 kg)   SpO2 99%   BMI 45.88 kg/m   Wt Readings from Last 3 Encounters:  02/06/23 242 lb 12.8 oz (110.1 kg)  08/26/22 269 lb 6.4 oz (122.2 kg)  08/20/22 271 lb 6.4 oz (123.1 kg)    Physical Exam Vitals and nursing note reviewed.  Constitutional:      General: She is not in acute distress.    Appearance: Normal appearance. She is well-developed and normal weight. She is not ill-appearing, toxic-appearing or diaphoretic.  HENT:     Head: Normocephalic and atraumatic.     Right Ear: Tympanic membrane, ear canal and external ear normal.     Left Ear: Tympanic membrane, ear canal and external ear normal.     Nose: Congestion and rhinorrhea present.     Mouth/Throat:     Mouth: Mucous membranes are moist.     Pharynx: Oropharynx is clear. Posterior oropharyngeal erythema present.  Eyes:     General: No scleral icterus.  Right eye: No discharge.        Left eye: No discharge.     Extraocular Movements: Extraocular movements intact.     Conjunctiva/sclera: Conjunctivae normal.     Pupils: Pupils are equal, round, and reactive to light.  Cardiovascular:     Rate and Rhythm: Normal rate and regular rhythm.     Pulses: Normal pulses.     Heart sounds: Normal heart sounds. No murmur heard.    No friction rub. No gallop.  Pulmonary:     Effort: Pulmonary effort is normal. No respiratory distress.     Breath sounds:  Normal breath sounds. No stridor. No wheezing, rhonchi or rales.  Chest:     Chest wall: No tenderness.  Musculoskeletal:        General: Normal range of motion.     Cervical back: Normal range of motion and neck supple.  Skin:    General: Skin is warm and dry.     Capillary Refill: Capillary refill takes less than 2 seconds.     Coloration: Skin is not jaundiced or pale.     Findings: No bruising, erythema, lesion or rash.  Neurological:     General: No focal deficit present.     Mental Status: She is alert and oriented to person, place, and time. Mental status is at baseline.  Psychiatric:        Mood and Affect: Mood normal.        Behavior: Behavior normal.        Thought Content: Thought content normal.        Judgment: Judgment normal.     Results for orders placed or performed in visit on 08/26/22  [id number] 11+Oxyco+Alc+Crt-Bund   Collection Time: 08/26/22  9:07 AM  Result Value Ref Range   Ethanol Negative Cutoff=0.020 %   Amphetamines, Urine Negative Cutoff=1000 ng/mL   Barbiturate Negative Cutoff=200 ng/mL   BENZODIAZ UR QL Negative Cutoff=200 ng/mL   Cannabinoid Quant, Ur See Final Results Cutoff=50 ng/mL   Cocaine (Metabolite) Negative Cutoff=300 ng/mL   OPIATE SCREEN URINE Negative Cutoff=300 ng/mL   Oxycodone/Oxymorphone, Urine Negative Cutoff=300 ng/mL   Phencyclidine Negative Cutoff=25 ng/mL   Methadone Screen, Urine Negative Cutoff=300 ng/mL   Propoxyphene Negative Cutoff=300 ng/mL   Meperidine Negative Cutoff=200 ng/mL   Tramadol Negative Cutoff=200 ng/mL   Creatinine 68.6 20.0 - 300.0 mg/dL   pH, Urine 7.2 4.5 - 8.9  Cannabinoid Conf, Ur   Collection Time: 08/26/22  9:07 AM  Result Value Ref Range   CANNABINOIDS Positive (A) Cutoff=50   Carboxy THC GC/MS Conf 177 Cutoff=15 ng/mL  TSH   Collection Time: 08/26/22  9:12 AM  Result Value Ref Range   TSH 2.150 0.450 - 4.500 uIU/mL  Lipid Panel w/o Chol/HDL Ratio   Collection Time: 08/26/22  9:12 AM   Result Value Ref Range   Cholesterol, Total 175 100 - 199 mg/dL   Triglycerides 308 0 - 149 mg/dL   HDL 49 >65 mg/dL   VLDL Cholesterol Cal 21 5 - 40 mg/dL   LDL Chol Calc (NIH) 784 (H) 0 - 99 mg/dL  CBC with Differential/Platelet   Collection Time: 08/26/22  9:12 AM  Result Value Ref Range   WBC 9.8 3.4 - 10.8 x10E3/uL   RBC 4.90 3.77 - 5.28 x10E6/uL   Hemoglobin 13.6 11.1 - 15.9 g/dL   Hematocrit 69.6 29.5 - 46.6 %   MCV 85 79 - 97 fL   MCH 27.8 26.6 - 33.0 pg  MCHC 32.9 31.5 - 35.7 g/dL   RDW 16.1 09.6 - 04.5 %   Platelets 415 150 - 450 x10E3/uL   Neutrophils 59 Not Estab. %   Lymphs 31 Not Estab. %   Monocytes 8 Not Estab. %   Eos 1 Not Estab. %   Basos 1 Not Estab. %   Neutrophils Absolute 5.8 1.4 - 7.0 x10E3/uL   Lymphocytes Absolute 3.1 0.7 - 3.1 x10E3/uL   Monocytes Absolute 0.8 0.1 - 0.9 x10E3/uL   EOS (ABSOLUTE) 0.1 0.0 - 0.4 x10E3/uL   Basophils Absolute 0.1 0.0 - 0.2 x10E3/uL   Immature Granulocytes 0 Not Estab. %   Immature Grans (Abs) 0.0 0.0 - 0.1 x10E3/uL  Comprehensive metabolic panel   Collection Time: 08/26/22  9:12 AM  Result Value Ref Range   Glucose 101 (H) 70 - 99 mg/dL   BUN 15 6 - 20 mg/dL   Creatinine, Ser 4.09 (H) 0.57 - 1.00 mg/dL   eGFR 73 >81 XB/JYN/8.29   BUN/Creatinine Ratio 14 9 - 23   Sodium 138 134 - 144 mmol/L   Potassium 4.8 3.5 - 5.2 mmol/L   Chloride 103 96 - 106 mmol/L   CO2 22 20 - 29 mmol/L   Calcium 9.2 8.7 - 10.2 mg/dL   Total Protein 6.4 6.0 - 8.5 g/dL   Albumin 4.4 4.0 - 5.0 g/dL   Globulin, Total 2.0 1.5 - 4.5 g/dL   Bilirubin Total 0.2 0.0 - 1.2 mg/dL   Alkaline Phosphatase 58 44 - 121 IU/L   AST 18 0 - 40 IU/L   ALT 22 0 - 32 IU/L      Assessment & Plan:   Problem List Items Addressed This Visit   None Visit Diagnoses       Atypical pneumonia    -  Primary   Will treat with prednisone and azithromycin. Call with any concerns or if not getting better.   Relevant Medications   azithromycin (ZITHROMAX) 250  MG tablet   Other Relevant Orders   Veritor Flu A/B Waived   Rapid Strep Screen ([facility] ONLY)   Novel Coronavirus, NAA ([facility])        Follow up plan: Return in about 2 weeks (around 02/20/2023) for physical.

## 2023-02-07 LAB — NOVEL CORONAVIRUS, NAA: SARS-CoV-2, NAA: NOT DETECTED

## 2023-02-09 ENCOUNTER — Encounter: Payer: Self-pay | Admitting: Oncology

## 2023-02-09 LAB — VERITOR FLU A/B WAIVED
Influenza A: NEGATIVE
Influenza B: NEGATIVE

## 2023-02-09 LAB — CULTURE, GROUP A STREP

## 2023-02-09 LAB — RAPID STREP SCREEN (MED CTR MEBANE ONLY): Strep Gp A Ag, IA W/Reflex: NEGATIVE

## 2023-02-14 ENCOUNTER — Other Ambulatory Visit: Payer: Self-pay | Admitting: Family Medicine

## 2023-02-17 NOTE — Telephone Encounter (Signed)
 Requested Prescriptions  Pending Prescriptions Disp Refills   montelukast  (SINGULAIR ) 10 MG tablet [Pharmacy Med Name: MONTELUKAST  10MG  TABLETS] 90 tablet 1    Sig: TAKE 1 TABLET(10 MG) BY MOUTH AT BEDTIME     Pulmonology:  Leukotriene Inhibitors Passed - 02/17/2023  2:13 PM      Passed - Valid encounter within last 12 months    Recent Outpatient Visits           1 week ago Atypical pneumonia   [place] [facility] [place], Megan P, DO   5 months ago Moderate persistent asthma without status asthmaticus without complication   [place] [facility] [place], Megan P, DO   8 months ago Moderate persistent asthma without status asthmaticus with acute exacerbation   [facility] [place], Megan P, DO   10 months ago Viral gastroenteritis   [place] [facility] Mecum, Rocky BRAVO, PA-C   1 year ago Routine general medical examination at a health care facility   [facility] Vicci Duwaine SQUIBB, DO       Future Appointments             In 1 week Vicci Duwaine SQUIBB, DO [place] [facility]

## 2023-02-26 ENCOUNTER — Encounter: Payer: BC Managed Care – PPO | Admitting: Family Medicine

## 2023-02-27 ENCOUNTER — Ambulatory Visit (INDEPENDENT_AMBULATORY_CARE_PROVIDER_SITE_OTHER): Payer: 59 | Admitting: Family Medicine

## 2023-02-27 ENCOUNTER — Encounter: Payer: Self-pay | Admitting: Oncology

## 2023-02-27 ENCOUNTER — Encounter: Payer: Self-pay | Admitting: Family Medicine

## 2023-02-27 VITALS — BP 135/91 | HR 85 | Temp 98.5°F | Ht 61.0 in | Wt 253.8 lb

## 2023-02-27 DIAGNOSIS — Z0000 Encounter for general adult medical examination without abnormal findings: Secondary | ICD-10-CM

## 2023-02-27 LAB — URINALYSIS, ROUTINE W REFLEX MICROSCOPIC
Bilirubin, UA: NEGATIVE
Glucose, UA: NEGATIVE
Ketones, UA: NEGATIVE
Leukocytes,UA: NEGATIVE
Nitrite, UA: NEGATIVE
Protein,UA: NEGATIVE
RBC, UA: NEGATIVE
Specific Gravity, UA: 1.01 (ref 1.005–1.030)
Urobilinogen, Ur: 0.2 mg/dL (ref 0.2–1.0)
pH, UA: 6.5 (ref 5.0–7.5)

## 2023-02-27 LAB — BAYER DCA HB A1C WAIVED: HB A1C (BAYER DCA - WAIVED): 4.9 % (ref 4.8–5.6)

## 2023-02-27 MED ORDER — MONTELUKAST SODIUM 10 MG PO TABS: 10.0000 mg | ORAL_TABLET | Freq: Every day | ORAL | 1 refills | Status: DC

## 2023-02-27 MED ORDER — CYCLOBENZAPRINE HCL 10 MG PO TABS: 10.0000 mg | ORAL_TABLET | Freq: Every day | ORAL | 2 refills | Status: AC

## 2023-02-27 MED ORDER — ALBUTEROL SULFATE (2.5 MG/3ML) 0.083% IN NEBU: 2.5000 mg | INHALATION_SOLUTION | Freq: Four times a day (QID) | RESPIRATORY_TRACT | 6 refills | Status: AC | PRN

## 2023-02-27 MED ORDER — NAPROXEN 500 MG PO TABS: 500.0000 mg | ORAL_TABLET | Freq: Two times a day (BID) | ORAL | 1 refills | Status: DC

## 2023-02-27 MED ORDER — FENOFIBRATE 145 MG PO TABS: 145.0000 mg | ORAL_TABLET | Freq: Every day | ORAL | 1 refills | Status: DC

## 2023-02-27 MED ORDER — OMEPRAZOLE 40 MG PO CPDR: DELAYED_RELEASE_CAPSULE | ORAL | 1 refills | Status: DC

## 2023-02-27 MED ORDER — ALBUTEROL SULFATE HFA 108 (90 BASE) MCG/ACT IN AERS: 2.0000 | INHALATION_SPRAY | Freq: Four times a day (QID) | RESPIRATORY_TRACT | 3 refills | Status: AC | PRN

## 2023-02-27 NOTE — Progress Notes (Unsigned)
BP (!) 135/91   Pulse 85   Temp 98.5 F (36.9 C) (Oral)   Ht 5\' 1"  (1.549 m)   Wt 253 lb 12.8 oz (115.1 kg)   SpO2 98%   BMI 47.96 kg/m    Subjective:    Patient ID: Mariah Gonzalez, female    DOB: 10-20-1990, 33 y.o.   MRN: [id number]  HPI: Mariah Gonzalez is a 33 y.o. female presenting on 02/27/2023 for comprehensive medical examination. Current medical complaints include:{Blank single:19197::"none","***"}  She currently lives with: wife Menopausal Symptoms: {Blank single:19197::"yes","no"}  Depression Screen done today and results listed below:     02/27/2023    3:23 PM 02/06/2023    1:21 PM 08/26/2022    8:49 AM 06/19/2022    8:45 AM 04/04/2022   10:47 AM  Depression screen PHQ 2/9  Decreased Interest 1 1 3 2 2   Down, Depressed, Hopeless 1 1 3 2 2   PHQ - 2 Score 2 2 6 4 4   Altered sleeping 1 1 3 2 2   Tired, decreased energy 1 1 3 3 2   Change in appetite 1 1 3 3 2   Feeling bad or failure about yourself  1 1 1 1  0  Trouble concentrating 1 1 2 2 1   Moving slowly or fidgety/restless 1 1 0 3 1  Suicidal thoughts 0 0 0 0 0  PHQ-9 Score 8 8 18 18 12   Difficult doing work/chores Somewhat difficult  Very difficult Very difficult Very difficult    The patient {has/does not have:19849} a history of falls. I {did/did not:19850} complete a risk assessment for falls. A plan of care for falls {was/was not:19852} documented.   Past Medical History:  Past Medical History:  Diagnosis Date  . ADHD   . Anxiety   . Asthma   . Depressed   . DUB (dysfunctional uterine bleeding)   . Dysfunctional uterine bleeding   . GERD (gastroesophageal reflux disease)   . High cholesterol   . History of COVID-19 01/24/2021  . Hypertension   . IBS (irritable bowel syndrome)   . IDA (iron deficiency anemia)   . LGSIL on Pap smear of cervix   . Migraine without aura and without status migrainosus, not intractable   . Pelvic pain in female     Surgical History:  Past  Surgical History:  Procedure Laterality Date  . debridement of rught eye    . ESOPHAGOGASTRODUODENOSCOPY (EGD) WITH PROPOFOL N/A 08/05/2021   Procedure: ESOPHAGOGASTRODUODENOSCOPY (EGD) WITH PROPOFOL;  Surgeon: Toney Reil, MD;  Location: [facility] ENDOSCOPY;  Service: Gastroenterology;  Laterality: N/A;  . gum grafting    . TONSILLECTOMY Bilateral 07/03/2020   Procedure: TONSILLECTOMY;  Surgeon: Geanie Logan, MD;  Location: [facility];  Service: ENT;  Laterality: Bilateral;  . WISDOM TOOTH EXTRACTION      Medications:  Current Outpatient Medications on File Prior to Visit  Medication Sig  . albuterol (PROVENTIL) (2.5 MG/3ML) 0.083% nebulizer solution Take 3 mLs (2.5 mg total) by nebulization every 6 (six) hours as needed for wheezing or shortness of breath.  Marland Kitchen albuterol (VENTOLIN HFA) 108 (90 Base) MCG/ACT inhaler Inhale 2 puffs into the lungs every 6 (six) hours as needed for wheezing or shortness of breath.  . amphetamine-dextroamphetamine (ADDERALL XR) 10 MG 24 hr capsule Take 10 mg by mouth daily.  Marland Kitchen amphetamine-dextroamphetamine (ADDERALL) 5 MG tablet Take 1 tablet by mouth daily. Patient is taking 0.5 tablet  . ARIPiprazole (ABILIFY) 5 MG tablet Take 10 mg  by mouth daily.  . baclofen (LIORESAL) 10 MG tablet TAKE 1/2 TO 1 TABLET(5 TO 10 MG) BY MOUTH THREE TIMES DAILY  . buPROPion ER (WELLBUTRIN SR) 100 MG 12 hr tablet Take 100 mg by mouth daily.  . cyclobenzaprine (FLEXERIL) 10 MG tablet Take 1 tablet (10 mg total) by mouth at bedtime.  . fenofibrate (TRICOR) 145 MG tablet TAKE 1 TABLET(145 MG) BY MOUTH DAILY  . lamoTRIgine (LAMICTAL) 100 MG tablet Take 100 mg by mouth in the morning.  . lamoTRIgine (LAMICTAL) 200 MG tablet TAKE 1 TABLET(200 MG) BY MOUTH DAILY  . LORazepam (ATIVAN) 0.5 MG tablet Take 1 tablet (0.5 mg total) by mouth as directed. Take 1 tablet 1-2 times a day as needed for severe anxiety attacks only - please limit use  . MELATONIN PO Take by mouth.  .  metoprolol succinate (TOPROL-XL) 25 MG 24 hr tablet TAKE 1 TABLET (25 MG) BY MOUTH DAILY WITH SUPPER  . montelukast (SINGULAIR) 10 MG tablet TAKE 1 TABLET(10 MG) BY MOUTH AT BEDTIME  . naproxen (NAPROSYN) 500 MG tablet Take 1 tablet (500 mg total) by mouth 2 (two) times daily with a meal.  . omeprazole (PRILOSEC) 40 MG capsule TAKE 1 CAPSULE(40 MG) BY MOUTH DAILY  . ondansetron (ZOFRAN) 4 MG tablet Take 1 tablet (4 mg total) by mouth every 8 (eight) hours as needed for nausea or vomiting.  . promethazine (PHENERGAN) 25 MG suppository Place 1 suppository (25 mg total) rectally every 6 (six) hours as needed for nausea or vomiting.  . traZODone (DESYREL) 100 MG tablet Take 100 mg by mouth at bedtime.  . Fluticasone-Umeclidin-Vilant (TRELEGY ELLIPTA) 100-62.5-25 MCG/ACT AEPB Inhale 1 puff into the lungs daily. (Patient not taking: Reported on 02/27/2023)  . gabapentin (NEURONTIN) 100 MG capsule Take 200 mg by mouth at bedtime. (Patient not taking: Reported on 02/27/2023)  . hydrocortisone cream 1 % Apply 1 application. topically 2 (two) times daily. (Patient not taking: Reported on 02/27/2023)  . lidocaine (XYLOCAINE) 2 % solution SMARTSIG:Milliliter(s) Topical Daily PRN (Patient not taking: Reported on 02/27/2023)  . sucralfate (CARAFATE) 1 g tablet TAKE 1 TABLET(1 GRAM) BY MOUTH FOUR TIMES DAILY AT BEDTIME WITH MEALS (Patient not taking: Reported on 02/27/2023)  . triamcinolone cream (KENALOG) 0.1 % Apply 1 Application topically 2 (two) times daily. (Patient not taking: Reported on 02/27/2023)   No current facility-administered medications on file prior to visit.    Allergies:  Allergies  Allergen Reactions  . Egg-Derived Products Diarrhea    Social History:  Social History   Socioeconomic History  . Marital status: Married    Spouse name: Not on file  . Number of children: Not on file  . Years of education: Not on file  . Highest education level: Bachelor's degree (e.g., BA, AB, BS)   Occupational History  . Occupation: Runner, broadcasting/film/video - [employer]    Comment: Sign Language -  Tobacco Use  . Smoking status: Every Day    Current packs/day: 0.50    Average packs/day: 0.5 packs/day for 2.0 years (1.0 ttl pk-yrs)    Types: Cigarettes    Passive exposure: Never  . Smokeless tobacco: Never  Vaping Use  . Vaping status: Never Used  Substance and Sexual Activity  . Alcohol use: Yes    Comment: occasionally  . Drug use: Yes    Types: Marijuana, Hydromorphone    Comment: Delta 8- chewed a gummy on 02/13/2022  . Sexual activity: Yes  Other Topics Concern  . Not  on file  Social History Narrative  . Not on file   Social Drivers of Health   Financial Resource Strain: Not on file  Food Insecurity: Not on file  Transportation Needs: Not on file  Physical Activity: Not on file  Stress: Not on file  Social Connections: Not on file  Intimate Partner Violence: Not on file   Social History   Tobacco Use  Smoking Status Every Day  . Current packs/day: 0.50  . Average packs/day: 0.5 packs/day for 2.0 years (1.0 ttl pk-yrs)  . Types: Cigarettes  . Passive exposure: Never  Smokeless Tobacco Never   Social History   Substance and Sexual Activity  Alcohol Use Yes   Comment: occasionally    Family History:  Family History  Problem Relation Age of Onset  . Hyperlipidemia Mother   . Hypertension Mother   . Depression Mother   . Hyperlipidemia Father   . Hypertension Father   . Mental illness Father   . Heart attack Father 46       Had 2 heart attacks  . Stroke Father   . Coronary artery disease Father   . Diabetes Mellitus II Father   . Melanoma Father   . Hypertension Brother   . Diabetes Paternal Grandfather   . Heart attack Paternal Grandfather 22  . Breast cancer Maternal Aunt   . Breast cancer Maternal Aunt   . Cancer Maternal Uncle   . Breast cancer Paternal Aunt   . Hearing loss Paternal Aunt   . Cancer Paternal Uncle   . Hearing loss Paternal Uncle    . Diabetes Paternal Uncle   . Heart attack Half-Brother 49  . Coronary artery disease Half-Brother 61       CABG x4  . Stroke Half-Brother 44    Past medical history, surgical history, medications, allergies, family history and social history reviewed with patient today and changes made to appropriate areas of the chart.   Review of Systems  Constitutional: Negative.   HENT: Negative.    Eyes: Negative.   Respiratory: Negative.    Cardiovascular: Negative.   Gastrointestinal:  Positive for constipation, diarrhea, heartburn, nausea and vomiting. Negative for abdominal pain, blood in stool and melena.  Genitourinary: Negative.   Musculoskeletal: Negative.   Skin: Negative.   Neurological: Negative.   Endo/Heme/Allergies:  Positive for environmental allergies. Negative for polydipsia. Bruises/bleeds easily.  Psychiatric/Behavioral:  Positive for depression. Negative for hallucinations, memory loss, substance abuse and suicidal ideas. The patient is nervous/anxious. The patient does not have insomnia.    All other ROS negative except what is listed above and in the HPI.      Objective:    BP (!) 135/91   Pulse 85   Temp 98.5 F (36.9 C) (Oral)   Ht 5\' 1"  (1.549 m)   Wt 253 lb 12.8 oz (115.1 kg)   SpO2 98%   BMI 47.96 kg/m   Wt Readings from Last 3 Encounters:  02/27/23 253 lb 12.8 oz (115.1 kg)  02/06/23 242 lb 12.8 oz (110.1 kg)  08/26/22 269 lb 6.4 oz (122.2 kg)    Physical Exam  Results for orders placed or performed in visit on 02/06/23  Rapid Strep Screen ([facility] ONLY)   Collection Time: 02/06/23  1:19 PM   Specimen: Other   Other  Result Value Ref Range   Strep Gp A Ag, IA W/Reflex Negative Negative  Culture, Group A Strep   Collection Time: 02/06/23  1:19 PM  Other  Result Value Ref Range   Strep A Culture Negative   Veritor Flu A/B Waived   Collection Time: 02/06/23  1:19 PM  Result Value Ref Range   Influenza A Negative Negative   Influenza  B Negative Negative  Novel Coronavirus, NAA ([facility])   Collection Time: 02/06/23  1:25 PM   Specimen: Nasopharyngeal(NP) swabs in vial transport medium  Result Value Ref Range   SARS-CoV-2, NAA Not Detected Not Detected      Assessment & Plan:   Problem List Items Addressed This Visit   None Visit Diagnoses       Routine general medical examination at a health care facility    -  Primary   Relevant Orders   CBC with Differential/Platelet   Comprehensive metabolic panel   Lipid Panel w/o Chol/HDL Ratio   TSH        Follow up plan: No follow-ups on file.   LABORATORY TESTING:  - Pap smear: {Blank single:19197::"pap done","not applicable","up to date","done elsewhere"}  IMMUNIZATIONS:   - Tdap: Tetanus vaccination status reviewed: {tetanus status:315746}. - Influenza: {Blank single:19197::"Up to date","Administered today","Postponed to flu season","Refused","Given elsewhere"} - Pneumovax: {Blank single:19197::"Up to date","Administered today","Not applicable","Refused","Given elsewhere"} - Prevnar: {Blank single:19197::"Up to date","Administered today","Not applicable","Refused","Given elsewhere"} - COVID: {Blank single:19197::"Up to date","Administered today","Not applicable","Refused","Given elsewhere"} - HPV: {Blank single:19197::"Up to date","Administered today","Not applicable","Refused","Given elsewhere"}   PATIENT COUNSELING:   Advised to take 1 mg of folate supplement per day if capable of pregnancy.   Sexuality: Discussed sexually transmitted diseases, partner selection, use of condoms, avoidance of unintended pregnancy  and contraceptive alternatives.   Advised to avoid cigarette smoking.  I discussed with the patient that most people either abstain from alcohol or drink within safe limits (<=14/week and <=4 drinks/occasion for males, <=7/weeks and <= 3 drinks/occasion for females) and that the risk for alcohol disorders and other health effects rises  proportionally with the number of drinks per week and how often a drinker exceeds daily limits.  Discussed cessation/primary prevention of drug use and availability of treatment for abuse.   Diet: Encouraged to adjust caloric intake to maintain  or achieve ideal body weight, to reduce intake of dietary saturated fat and total fat, to limit sodium intake by avoiding high sodium foods and not adding table salt, and to maintain adequate dietary potassium and calcium preferably from fresh fruits, vegetables, and low-fat dairy products.    stressed the importance of regular exercise  Injury prevention: Discussed safety belts, safety helmets, smoke detector, smoking near bedding or upholstery.   Dental health: Discussed importance of regular tooth brushing, flossing, and dental visits.    NEXT PREVENTATIVE PHYSICAL DUE IN 1 YEAR. No follow-ups on file.

## 2023-02-28 LAB — COMPREHENSIVE METABOLIC PANEL
ALT: 20 [IU]/L (ref 0–32)
AST: 19 [IU]/L (ref 0–40)
Albumin: 4.3 g/dL (ref 4.0–5.0)
Alkaline Phosphatase: 61 [IU]/L (ref 44–121)
BUN/Creatinine Ratio: 13 (ref 9–23)
BUN: 11 mg/dL (ref 6–20)
Bilirubin Total: 0.2 mg/dL (ref 0.0–1.2)
CO2: 22 mmol/L (ref 20–29)
Calcium: 9.2 mg/dL (ref 8.7–10.2)
Chloride: 100 mmol/L (ref 96–106)
Creatinine, Ser: 0.87 mg/dL (ref 0.57–1.00)
Globulin, Total: 2.3 g/dL (ref 1.5–4.5)
Glucose: 82 mg/dL (ref 70–99)
Potassium: 4.4 mmol/L (ref 3.5–5.2)
Sodium: 137 mmol/L (ref 134–144)
Total Protein: 6.6 g/dL (ref 6.0–8.5)
eGFR: 92 mL/min/{1.73_m2} (ref >59)

## 2023-02-28 LAB — CBC WITH DIFFERENTIAL/PLATELET
Basophils Absolute: 0.1 10*3/uL (ref 0.0–0.2)
Basos: 1 %
EOS (ABSOLUTE): 0.1 10*3/uL (ref 0.0–0.4)
Eos: 1 %
Hematocrit: 36.8 % (ref 34.0–46.6)
Hemoglobin: 11.8 g/dL (ref 11.1–15.9)
Immature Grans (Abs): 0 10*3/uL (ref 0.0–0.1)
Immature Granulocytes: 0 %
Lymphocytes Absolute: 3.9 10*3/uL — ABNORMAL HIGH (ref 0.7–3.1)
Lymphs: 33 %
MCH: 26.5 pg — ABNORMAL LOW (ref 26.6–33.0)
MCHC: 32.1 g/dL (ref 31.5–35.7)
MCV: 83 fL (ref 79–97)
Monocytes Absolute: 0.7 10*3/uL (ref 0.1–0.9)
Monocytes: 6 %
Neutrophils Absolute: 6.9 10*3/uL (ref 1.4–7.0)
Neutrophils: 59 %
Platelets: 434 10*3/uL (ref 150–450)
RBC: 4.46 x10E6/uL (ref 3.77–5.28)
RDW: 13.4 % (ref 11.7–15.4)
WBC: 11.6 10*3/uL — ABNORMAL HIGH (ref 3.4–10.8)

## 2023-02-28 LAB — LIPID PANEL W/O CHOL/HDL RATIO
Cholesterol, Total: 187 mg/dL (ref 100–199)
HDL: 57 mg/dL (ref >39)
LDL Chol Calc (NIH): 116 mg/dL — ABNORMAL HIGH (ref 0–99)
Triglycerides: 76 mg/dL (ref 0–149)
VLDL Cholesterol Cal: 14 mg/dL (ref 5–40)

## 2023-02-28 LAB — TSH: TSH: 1.07 u[IU]/mL (ref 0.450–4.500)

## 2023-03-01 ENCOUNTER — Encounter: Payer: Self-pay | Admitting: Family Medicine

## 2023-03-13 ENCOUNTER — Ambulatory Visit: Payer: Self-pay | Admitting: *Deleted

## 2023-03-13 ENCOUNTER — Ambulatory Visit: Payer: 59 | Admitting: Pediatrics

## 2023-03-13 VITALS — BP 128/84 | HR 84 | Temp 98.8°F | Resp 16 | Ht 60.98 in | Wt 246.6 lb

## 2023-03-13 DIAGNOSIS — Z1330 Encounter for screening examination for mental health and behavioral disorders, unspecified: Secondary | ICD-10-CM | POA: Diagnosis not present

## 2023-03-13 DIAGNOSIS — L509 Urticaria, unspecified: Secondary | ICD-10-CM | POA: Diagnosis not present

## 2023-03-13 MED ORDER — HYDROXYZINE HCL 10 MG PO TABS: 5.0000 mg | ORAL_TABLET | Freq: Three times a day (TID) | ORAL | 0 refills | Status: DC | PRN

## 2023-03-13 MED ORDER — METHYLPREDNISOLONE 4 MG PO TBPK: ORAL_TABLET | ORAL | 0 refills | Status: DC

## 2023-03-13 NOTE — Telephone Encounter (Signed)
  Chief Complaint: rash/hives Symptoms: itching, all over hives Frequency: 2 nights ago Pertinent Negatives: Patient denies dizziness, headache, sore throat, joint pain Disposition: [] ED /[] Urgent Care (no appt availability in office) / [x] Appointment(In office/virtual)/ []  [facility]/ [] Home Care/ [] Refused Recommended Disposition /[] [facility]/ []  Follow-up with PCP Additional Notes: Patient has appointment this afternoon- will continue home care- try to figure out any new exposures.

## 2023-03-13 NOTE — Progress Notes (Unsigned)
Office Visit  BP 128/84 (BP Location: Left Arm, Patient Position: Sitting, Cuff Size: Large)   Pulse 84   Temp 98.8 F (37.1 C) (Oral)   Resp 16   Ht 5' 0.98" (1.549 m)   Wt 246 lb 9.6 oz (111.9 kg)   LMP 02/24/2023 (Approximate)   SpO2 99%   BMI 46.62 kg/m    Subjective:    Patient ID: Mariah Gonzalez, female    DOB: [birth year], 33 y.o.   MRN: [id number]  HPI: Mariah Gonzalez is a 33 y.o. female  Chief Complaint  Patient presents with  . Urticaria    Started 03/11/2023 in the evening. Nothing has changed in habits but does have new dog.     Discussed the use of AI scribe software for clinical note transcription with the patient, who gave verbal consent to proceed.  History of Present Illness  4          Relevant past medical, surgical, family and social history reviewed and updated as indicated. Interim medical history since our last visit reviewed. Allergies and medications reviewed and updated.  ROS per HPI unless specifically indicated above     Objective:    BP 128/84 (BP Location: Left Arm, Patient Position: Sitting, Cuff Size: Large)   Pulse 84   Temp 98.8 F (37.1 C) (Oral)   Resp 16   Ht 5' 0.98" (1.549 m)   Wt 246 lb 9.6 oz (111.9 kg)   LMP 02/24/2023 (Approximate)   SpO2 99%   BMI 46.62 kg/m   Wt Readings from Last 3 Encounters:  03/13/23 246 lb 9.6 oz (111.9 kg)  02/27/23 253 lb 12.8 oz (115.1 kg)  02/06/23 242 lb 12.8 oz (110.1 kg)     Physical Exam      02/27/2023    3:23 PM 02/06/2023    1:21 PM 08/26/2022    8:49 AM 06/19/2022    8:45 AM 04/04/2022   10:47 AM  Depression screen PHQ 2/9  Decreased Interest 1 1 3 2 2   Down, Depressed, Hopeless 1 1 3 2 2   PHQ - 2 Score 2 2 6 4 4   Altered sleeping 1 1 3 2 2   Tired, decreased energy 1 1 3 3 2   Change in appetite 1 1 3 3 2   Feeling bad or failure about yourself  1 1 1 1  0  Trouble concentrating 1 1 2 2 1   Moving slowly or fidgety/restless 1 1 0 3 1  Suicidal  thoughts 0 0 0 0 0  PHQ-9 Score 8 8 18 18 12   Difficult doing work/chores Somewhat difficult  Very difficult Very difficult Very difficult       02/27/2023    3:23 PM 02/06/2023    1:20 PM 08/26/2022    8:50 AM 06/19/2022    8:45 AM  GAD 7 : Generalized Anxiety Score  Nervous, Anxious, on Edge 1 1 2 2   Control/stop worrying 1 1 2 2   Worry too much - different things 1 1 2 2   Trouble relaxing 1 1 2 2   Restless 1 1 1 2   Easily annoyed or irritable 1 1 2 3   Afraid - awful might happen 1 1 2 1   Total GAD 7 Score 7 7 13 14   Anxiety Difficulty Somewhat difficult  Very difficult Very difficult       Assessment & Plan:  Assessment & Plan   There are no diagnoses linked to this encounter.   Assessment and Plan  Follow up plan: No follow-ups on file.  Arfa Lamarca Howell Pringle, MD

## 2023-03-13 NOTE — Telephone Encounter (Signed)
Reason for Disposition  [1] MODERATE-SEVERE hives persist (i.e., hives interfere with normal activities or work) AND [2] taking antihistamine (e.g., Benadryl, Claritin) > 24 hours  Answer Assessment - Initial Assessment Questions 1. APPEARANCE of RASH: "Describe the rash." (e.g., spots, blisters, raised areas, skin peeling, scaly)     Blotches-with bumps 2. SIZE: "How big are the spots?" (e.g., tip of pen, eraser, coin; inches, centimeters)     Bug bite 3. LOCATION: "Where is the rash located?"     All over except face- started on torso, arms/hands,legs/feet 4. COLOR: "What color is the rash?" (Note: It is difficult to assess rash color in people with darker-colored skin. When this situation occurs, simply ask the caller to describe what they see.)     red 5. ONSET: "When did the rash begin?"     Started 2 nights ago- Wednesday evening 6. FEVER: "Do you have a fever?" If Yes, ask: "What is your temperature, how was it measured, and when did it start?"     no 7. ITCHING: "Does the rash itch?" If Yes, ask: "How bad is the itch?" (Scale 1-10; or mild, moderate, severe)     yes 8. CAUSE: "What do you think is causing the rash?"     Thought heat rash, new dog 9. MEDICINE FACTORS: "Have you started any new medicines within the last 2 weeks?" (e.g., antibiotics)      Patient has had change in her ADHD medication  10. OTHER SYMPTOMS: "Do you have any other symptoms?" (e.g., dizziness, headache, sore throat, joint pain)       no  Protocols used: Rash or Redness - Widespread-A-AH, Hives-A-AH

## 2023-03-13 NOTE — Patient Instructions (Addendum)
Sarna cream - over the counter  Start medrol pack Hydroxyzine 10mg  three times a day as needed

## 2023-03-15 ENCOUNTER — Encounter: Payer: Self-pay | Admitting: Family Medicine

## 2023-03-19 ENCOUNTER — Encounter: Payer: Self-pay | Admitting: Pediatrics

## 2023-03-26 NOTE — Progress Notes (Unsigned)
Cardiology Clinic Note   Date: 03/27/2023 ID: Mariah Gonzalez, DOB 1990-11-23, MRN [id number]  Primary Cardiologist:  Bryan Lemma, MD  Chief Complaint   Mariah Gonzalez is a 33 y.o. female who presents to the clinic today for routine follow up.   Patient Profile   Mariah Gonzalez is followed by Dr. Herbie Baltimore for the history outlined below.      Past medical history significant for: Chest pain. 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  Negative for ischemia. Palpitations/inappropriate sinus tachycardia. 7-day ZIO 12/26/2021: HR 59 to 160 bpm.  Predominantly sinus rhythm.  Rare ectopy.  No sustained or unsustained arrhythmias/abnormal heart rhythms.  Symptoms noted with sinus tachycardia. Hypertriglyceridemia. Lipid panel 02/27/2023: LDL 116, HDL 57, TG 76, total 187. Migraines. Asthma. GERD. Former tobacco abuse.  In summary, patient was first evaluated by Dr. Herbie Baltimore on 12/26/2021 for chest pain and fast heart rate at the request of Dr. Laural Benes.  She had recently been evaluated in the ED for chest pain and was treated for URI with Zithromax and prednisone taper.  Patient wore a 7-day ZIO for evaluation of elevated heart rate which showed predominantly sinus rhythm with sinus tachycardia as detailed above.  Exercise stress test was negative for ischemia as detailed above.  Upon follow-up in February 2024 she reported slight improvement in palpitations on Toprol.  Patient was last seen in the office by Cadence Furth, PA-C on 08/20/2022 for routine follow-up.  She was doing well at that time with resolution of palpitations on Toprol.  HR 96 bpm at that time.  Toprol was increased to 25 mg daily.     History of Present Illness    Today, patient reports occasional chest pain related to anxiety. Patient denies shortness of breath, dyspnea on exertion, lower extremity edema, orthopnea or PND. She denies palpitations. She does  feel her heart race when she is exercising but she recovers quickly with rest. Her HR is typically in the high 80s to mid 90s. She walks for exercise. She quit smoking in August.       ROS: All other systems reviewed and are otherwise negative except as noted in History of Present Illness.  EKGs/Labs Reviewed    EKG Interpretation Date/Time:  Friday March 27 2023 14:45:51 EST Ventricular Rate:  96 PR Interval:  152 QRS Duration:  82 QT Interval:  330 QTC Calculation: 416 R Axis:   21  Text Interpretation: Normal sinus rhythm Normal ECG When compared with ECG of 20-Aug-2022 10:07, No significant change was found Confirmed by Carlos Levering [phone]) on 03/27/2023 3:15:27 PM   02/27/2023: ALT 20; AST 19; BUN 11; Creatinine, Ser 0.87; Potassium 4.4; Sodium 137   02/27/2023: Hemoglobin 11.8; WBC 11.6   02/27/2023: TSH 1.070    Physical Exam    VS:  BP 128/88   Pulse 96   Ht 5\' 1"  (1.549 m)   Wt 244 lb 9.6 oz (110.9 kg)   LMP 02/24/2023 (Approximate)   SpO2 99%   BMI 46.22 kg/m  , BMI Body mass index is 46.22 kg/m.  GEN: Well nourished, well developed, in no acute distress. Neck: No JVD or carotid bruits. Cardiac:  RRR. No murmurs. No rubs or gallops.   Respiratory:  Respirations regular and unlabored. Clear to auscultation without rales, wheezing or rhonchi. GI: Soft, nontender, nondistended. Extremities: Radials/DP/PT 2+ and equal bilaterally. No clubbing or cyanosis. No edema. Resolving ecchymosis anterior lower leg on the right without edema.  Skin: Warm  and dry, no rash. Neuro: Strength intact.  Assessment & Plan   Palpitations/inappropriate sinus tachycardia 7-day ZIO November 2023 showed HR 59 to 160 bpm, predominantly sinus rhythm with sinus tachycardia, rare ectopy, no sustained arrhythmias.  Patient denies palpitations. She reports she feels her heart race with exercise but she recovers quickly with rest. She likes to walk for exercise.  -Continue  Toprol.  Hypertriglyceridemia Lipid panel January 2025 showed TG 76, at goal. -Continue fenofibrate.  Former tobacco abuse Patient reports she quit smoking in August. She is congratulated on her success.  -Encouraged continued complete cessation.   Disposition: Return in 1 year or sooner as needed.          Signed, Etta Grandchild. Ferlin Fairhurst, DNP, NP-C

## 2023-03-27 ENCOUNTER — Encounter: Payer: Self-pay | Admitting: Student

## 2023-03-27 ENCOUNTER — Encounter: Payer: Self-pay | Admitting: Oncology

## 2023-03-27 ENCOUNTER — Ambulatory Visit: Payer: 59 | Attending: Student | Admitting: Student

## 2023-03-27 VITALS — BP 128/88 | HR 96 | Ht 61.0 in | Wt 244.6 lb

## 2023-03-27 DIAGNOSIS — E781 Pure hyperglyceridemia: Secondary | ICD-10-CM | POA: Diagnosis not present

## 2023-03-27 DIAGNOSIS — R002 Palpitations: Secondary | ICD-10-CM

## 2023-03-27 DIAGNOSIS — Z87891 Personal history of nicotine dependence: Secondary | ICD-10-CM

## 2023-03-27 DIAGNOSIS — I4711 Inappropriate sinus tachycardia, so stated: Secondary | ICD-10-CM

## 2023-03-27 NOTE — Patient Instructions (Signed)
Medication Instructions:  No changes at this time.   *If you need a refill on your cardiac medications before your next appointment, please call your pharmacy*   Lab Work: None  If you have labs (blood work) drawn today and your tests are completely normal, you will receive your results only by: MyChart Message (if you have MyChart) OR A paper copy in the mail If you have any lab test that is abnormal or we need to change your treatment, we will call you to review the results.   Testing/Procedures: None   Follow-Up: At [facility], you and your health needs are our priority.  As part of our continuing mission to provide you with exceptional heart care, we have created designated Provider Care Teams.  These Care Teams include your primary Cardiologist (physician) and Advanced Practice Providers (APPs -  Physician Assistants and Nurse Practitioners) who all work together to provide you with the care you need, when you need it.   Your next appointment:   1 year(s)  Provider:   You may see Bryan Lemma, MD or one of the following Advanced Practice Providers on your designated Care Team:   Nicolasa Ducking, NP Eula Listen, PA-C Cadence Fransico Michael, PA-C Charlsie Quest, NP Carlos Levering, NP

## 2023-05-08 ENCOUNTER — Other Ambulatory Visit: Payer: Self-pay | Admitting: Family Medicine

## 2023-05-21 ENCOUNTER — Other Ambulatory Visit: Payer: Self-pay | Admitting: Family Medicine

## 2023-05-21 NOTE — Telephone Encounter (Signed)
 Requested Prescriptions  Pending Prescriptions Disp Refills   montelukast (SINGULAIR) 10 MG tablet [Pharmacy Med Name: MONTELUKAST 10MG  TABLETS] 90 tablet 1    Sig: TAKE 1 TABLET(10 MG) BY MOUTH AT BEDTIME     Pulmonology:  Leukotriene Inhibitors Failed - 05/21/2023  2:54 PM      Failed - Valid encounter within last 12 months    Recent Outpatient Visits   None

## 2023-08-19 ENCOUNTER — Encounter: Payer: Self-pay | Admitting: Nurse Practitioner

## 2023-08-19 ENCOUNTER — Encounter: Payer: Self-pay | Admitting: Family Medicine

## 2023-08-19 ENCOUNTER — Ambulatory Visit: Admitting: Nurse Practitioner

## 2023-08-19 ENCOUNTER — Ambulatory Visit: Payer: Self-pay

## 2023-08-19 VITALS — BP 140/93 | HR 99 | Temp 99.6°F | Ht 61.0 in | Wt 245.8 lb

## 2023-08-19 DIAGNOSIS — R21 Rash and other nonspecific skin eruption: Secondary | ICD-10-CM | POA: Diagnosis not present

## 2023-08-19 MED ORDER — HYDROXYZINE HCL 10 MG PO TABS: 5.0000 mg | ORAL_TABLET | Freq: Three times a day (TID) | ORAL | 0 refills | Status: AC | PRN

## 2023-08-19 MED ORDER — VALACYCLOVIR HCL 1 G PO TABS
1000.0000 mg | ORAL_TABLET | Freq: Two times a day (BID) | ORAL | 0 refills | Status: AC
Start: 2023-08-19 — End: 2023-08-29

## 2023-08-19 MED ORDER — LIDOCAINE (ANORECTAL) 5 % EX GEL: 1.0000 | Freq: Two times a day (BID) | CUTANEOUS | 0 refills | Status: AC | PRN

## 2023-08-19 MED ORDER — METHYLPREDNISOLONE 4 MG PO TBPK
ORAL_TABLET | ORAL | 0 refills | Status: DC
Start: 2023-08-19 — End: 2023-09-21

## 2023-08-19 NOTE — Assessment & Plan Note (Addendum)
 Acute for a few days, ?sun reaction due to her medications and risks for reaction with them.  Will send in steroid taper and refills on Hydroxyzine .  Recommend she continue Claritin 10 MG daily and use sun screen when out in the sun.  Do not scratch at areas.  Utilize oatmeal baths or soaks + Sarna lotion as needed.    For rash to groin and gluteal area send in Valtrex  which she reports offered benefit in past and Lidocaine  gel, she reports this helps discomfort. 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

## 2023-08-19 NOTE — Telephone Encounter (Signed)
 Noted

## 2023-08-19 NOTE — Progress Notes (Signed)
 BP (!) 140/93   Pulse 99   Temp 99.6 F (37.6 C) (Oral)   Ht 5' 1 (1.549 m)   Wt 245 lb 12.8 oz (111.5 kg)   SpO2 97%   BMI 46.44 kg/m    Subjective:    Patient ID: Mariah Gonzalez, female    DOB: 1990-12-07, 33 y.o.   MRN: [id number]  HPI: Mariah Gonzalez is a 33 y.o. female  Chief Complaint  Patient presents with   Urticaria    Patient states she has broken out in hives over the last week on her arms and legs. States she is itching.    Herpes Zoster    Patient states she thinks she may have shingles in the crack of her buttock    RASH Saw Dr. Herold for this last on 03/13/23, currently has rash similar to this.  Started last week to arms and legs, they increased Lamictal  recently and then she went out in the sun, no sun screen used. This is when rash presented. They increased due to worsening mood. Has been on Lamictal  since age 54 to 58.  Is at [facility] + sees therapy. When rash happened last time she had not had changes to Lamictal .  Rash to bottom area is to gluteal folds and spreading to groin.  Had this originally in 2018, similar rash.  Reports it is a shingles outbreak. Has had more stress recently. This rash started 3 days ago. 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 Duration:  days  Location: arms, legs, groin, and bottom  Itching: yes Burning: yes to bottom Redness: yes Oozing: no Scaling: no Blisters: unknown Painful: yes to bottom area Fevers: no Change in detergents/soaps/personal care products: no Recent illness: no Recent travel:no History of same: yes Context: fluctuating Alleviating factors: benadryl Treatments attempted:benadryl and Vistaril   Shortness of breath: no  Throat/tongue swelling: no Myalgias/arthralgias: no   Relevant past medical, surgical, family and social history reviewed and updated as indicated. Interim medical history since our last visit reviewed. Allergies and  medications reviewed and updated.  Review of Systems  Constitutional:  Negative for activity change, appetite change, diaphoresis, fatigue and fever.  Respiratory:  Negative for cough, chest tightness and shortness of breath.   Cardiovascular:  Negative for chest pain, palpitations and leg swelling.  Gastrointestinal: Negative.   Skin:  Positive for rash.  Neurological: Negative.   Psychiatric/Behavioral: Negative.     Per HPI unless specifically indicated above     Objective:    BP (!) 140/93   Pulse 99   Temp 99.6 F (37.6 C) (Oral)   Ht 5' 1 (1.549 m)   Wt 245 lb 12.8 oz (111.5 kg)   SpO2 97%   BMI 46.44 kg/m   Wt Readings from Last 3 Encounters:  08/19/23 245 lb 12.8 oz (111.5 kg)  03/27/23 244 lb 9.6 oz (110.9 kg)  03/13/23 246 lb 9.6 oz (111.9 kg)    Physical Exam Vitals and nursing note reviewed.  Constitutional:      General: She is awake. She is not in acute distress.    Appearance: She is well-developed and well-groomed. She is obese. She is not ill-appearing or toxic-appearing.  HENT:     Head: Normocephalic.     Right Ear: Hearing and external ear normal.     Left Ear: Hearing and external ear normal.  Eyes:     General: Lids are normal.  Right eye: No discharge.        Left eye: No discharge.     Conjunctiva/sclera: Conjunctivae normal.     Pupils: Pupils are equal, round, and reactive to light.  Neck:     Thyroid: No thyromegaly.     Vascular: No carotid bruit.  Cardiovascular:     Rate and Rhythm: Normal rate and regular rhythm.     Heart sounds: Normal heart sounds. No murmur heard.    No gallop.  Pulmonary:     Effort: Pulmonary effort is normal. No accessory muscle usage or respiratory distress.     Breath sounds: Normal breath sounds.  Abdominal:     General: Bowel sounds are normal. There is no distension.     Palpations: Abdomen is soft.     Tenderness: There is no abdominal tenderness.  Musculoskeletal:     Cervical back: Normal  range of motion and neck supple.     Right lower leg: No edema.     Left lower leg: No edema.  Lymphadenopathy:     Cervical: No cervical adenopathy.  Skin:    General: Skin is warm and dry.     Comments: Scattered, raised, red bumps to both arms and lower legs.  No vesicles.  To gluteal and groin area scattered red bumps with blister appearance and red, no drainage noted.  Neurological:     Mental Status: She is alert and oriented to person, place, and time.     Deep Tendon Reflexes: Reflexes are normal and symmetric.     Reflex Scores:      Brachioradialis reflexes are 2+ on the right side and 2+ on the left side.      Patellar reflexes are 2+ on the right side and 2+ on the left side. Psychiatric:        Attention and Perception: Attention normal.        Mood and Affect: Mood normal.        Speech: Speech normal.        Behavior: Behavior normal. Behavior is cooperative.        Thought Content: Thought content normal.     Results for orders placed or performed in visit on 02/27/23  CBC with Differential/Platelet   Collection Time: 02/27/23  3:46 PM  Result Value Ref Range   WBC 11.6 (H) 3.4 - 10.8 x10E3/uL   RBC 4.46 3.77 - 5.28 x10E6/uL   Hemoglobin 11.8 11.1 - 15.9 g/dL   Hematocrit 63.1 65.9 - 46.6 %   MCV 83 79 - 97 fL   MCH 26.5 (L) 26.6 - 33.0 pg   MCHC 32.1 31.5 - 35.7 g/dL   RDW 86.5 88.2 - 84.5 %   Platelets 434 150 - 450 x10E3/uL   Neutrophils 59 Not Estab. %   Lymphs 33 Not Estab. %   Monocytes 6 Not Estab. %   Eos 1 Not Estab. %   Basos 1 Not Estab. %   Neutrophils Absolute 6.9 1.4 - 7.0 x10E3/uL   Lymphocytes Absolute 3.9 (H) 0.7 - 3.1 x10E3/uL   Monocytes Absolute 0.7 0.1 - 0.9 x10E3/uL   EOS (ABSOLUTE) 0.1 0.0 - 0.4 x10E3/uL   Basophils Absolute 0.1 0.0 - 0.2 x10E3/uL   Immature Granulocytes 0 Not Estab. %   Immature Grans (Abs) 0.0 0.0 - 0.1 x10E3/uL  Comprehensive metabolic panel   Collection Time: 02/27/23  3:46 PM  Result Value Ref Range    Glucose 82 70 - 99 mg/dL   BUN  11 6 - 20 mg/dL   Creatinine, Ser 9.12 0.57 - 1.00 mg/dL   eGFR 92 >40 fO/fpw/8.26   BUN/Creatinine Ratio 13 9 - 23   Sodium 137 134 - 144 mmol/L   Potassium 4.4 3.5 - 5.2 mmol/L   Chloride 100 96 - 106 mmol/L   CO2 22 20 - 29 mmol/L   Calcium 9.2 8.7 - 10.2 mg/dL   Total Protein 6.6 6.0 - 8.5 g/dL   Albumin 4.3 4.0 - 5.0 g/dL   Globulin, Total 2.3 1.5 - 4.5 g/dL   Bilirubin Total 0.2 0.0 - 1.2 mg/dL   Alkaline Phosphatase 61 44 - 121 IU/L   AST 19 0 - 40 IU/L   ALT 20 0 - 32 IU/L  Lipid Panel w/o Chol/HDL Ratio   Collection Time: 02/27/23  3:46 PM  Result Value Ref Range   Cholesterol, Total 187 100 - 199 mg/dL   Triglycerides 76 0 - 149 mg/dL   HDL 57 >60 mg/dL   VLDL Cholesterol Cal 14 5 - 40 mg/dL   LDL Chol Calc (NIH) 883 (H) 0 - 99 mg/dL  TSH   Collection Time: 02/27/23  3:46 PM  Result Value Ref Range   TSH 1.070 0.450 - 4.500 uIU/mL  Bayer DCA Hb A1c Waived   Collection Time: 02/27/23  3:51 PM  Result Value Ref Range   HB A1C (BAYER DCA - WAIVED) 4.9 4.8 - 5.6 %  Urinalysis, Routine w reflex microscopic   Collection Time: 02/27/23  4:13 PM  Result Value Ref Range   Specific Gravity, UA 1.010 1.005 - 1.030   pH, UA 6.5 5.0 - 7.5   Color, UA Yellow Yellow   Appearance Ur Clear Clear   Leukocytes,UA Negative Negative   Protein,UA Negative Negative/Trace   Glucose, UA Negative Negative   Ketones, UA Negative Negative   RBC, UA Negative Negative   Bilirubin, UA Negative Negative   Urobilinogen, Ur 0.2 0.2 - 1.0 mg/dL   Nitrite, UA Negative Negative      Assessment & Plan:   Problem List Items Addressed This Visit       Musculoskeletal and Integument   Rash - Primary   Acute for a few days, ?sun reaction due to her medications and risks for reaction with them.  Will send in steroid taper and refills on Hydroxyzine .  Recommend she continue Claritin 10 MG daily and use sun screen when out in the sun.  Do not scratch at areas.   Utilize oatmeal baths or soaks + Sarna lotion as needed.    For rash to groin and gluteal area send in Valtrex  which she reports offered benefit in past and Lidocaine  gel, she reports this helps discomfort. 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      Relevant Medications   hydrOXYzine  (ATARAX ) 10 MG tablet     Follow up plan: Return if symptoms worsen or fail to improve.

## 2023-08-19 NOTE — Patient Instructions (Signed)
 Rash, Adult  A rash is a breakout of spots or blotches on the skin. It can change the way your skin looks and feels. Many things can cause a rash. The goal of treatment is to stop the itching and keep the rash from spreading. Follow these instructions at home: Medicine Take or apply over-the-counter and prescription medicines only as told by your doctor. These may include medicines to treat: Red or swollen skin. Itching. An allergy. Pain. An infection.  Skin care Put a cool, wet cloth on the rash. Do not scratch or rub your skin. Try not to cover the rash. Keep it exposed to air as often as you can. Managing itching and discomfort Avoid hot showers or baths. These can make itching worse. A cold shower may help. Try taking a bath with: Epsom salts. You can get these at your pharmacy or grocery store. Follow the instructions on the package. Baking soda. Pour a small amount into the bath as told by your doctor. Colloidal oatmeal. You can get this at your pharmacy or grocery store. Follow the instructions on the package. Try putting baking soda paste on your skin. Stir water into baking soda until it gets like a paste. Try putting on a lotion to help with itching (calamine lotion). Keep cool. Stay out of the sun. Sweating and being hot can make itching worse. General instructions  Rest as needed. Drink enough fluid to keep your pee (urine) pale yellow. Wear loose-fitting clothes. Avoid scented soaps, detergents, and perfumes. Use gentle soaps, detergents, perfumes, and cosmetics. Avoid the things that cause your rash. Keep a journal to help keep track of what causes your rash. Write down: What you eat. What cosmetics you use. What you drink. What you wear. This includes jewelry. Contact a doctor if: You sweat a lot at night. You pee (urinate) more or less than normal. Your pee is a darker color than normal. Your eyes are sensitive to light. Your skin or the white parts of your  eyes turn yellow. Your skin tingles or is numb. You get painful blisters in your nose or mouth. Your rash does not go away after a few days, or it gets worse. You are more tired than normal. You are more thirsty than normal. You have new or worse symptoms. These may include: Pain in your belly. A fever. Watery poop (diarrhea). Vomiting. Weakness. Weight loss. Get help right away if: You start to feel mixed up (confused). You have a very bad headache or a stiff neck. You have very bad joint pain or stiffness. You get very sleepy or not responsive. You have a seizure. This information is not intended to replace advice given to you by your health care provider. Make sure you discuss any questions you have with your health care provider. Document Revised: 11/15/2021 Document Reviewed: 11/15/2021 Elsevier Patient Education  2024 [employer].

## 2023-08-19 NOTE — Telephone Encounter (Signed)
 Copied from CRM [id number]. Topic: Clinical - Red Word Triage >> Aug 19, 2023 11:14 AM [name] wrote: [facility] that prompted transfer to Nurse Triage: shingles/ hives with redness and pain- bottom, inner thighs, and arms      FYI Only or Action Required?: FYI only for provider.  Patient was last seen in primary care on 03/13/2023 by [name], MD.  Called Nurse Triage reporting Rash.  Symptoms began a week ago.   Symptoms are: gradually worsening.  Triage Disposition: See PCP When Office is Open (Within 3 Days)  Patient/caregiver understands and will follow disposition?: Yes      1. APPEARANCE: What does the rash look like?  Red circles  2. LOCATION: Where is the rash located?  Inner thighs, buttocks, arms  5. ONSET: When did the hives begin? (e.g., hours or days ago)  1 week 6. ITCHING: Does it itch? If Yes, ask: How bad is the itch? (e.g., none, mild, moderate, severe) - NONE: No itching. - MILD: Doesn't interfere with normal activities. - MODERATE-SEVERE: Interferes with work, school, sleep, or other activities.  Moderate to severe  7. RECURRENT PROBLEM: Have you had hives before? If Yes, ask: When was the last time? and What happened that time?  Has had shingles in the past  8. TRIGGERS: Were you exposed to any new food, plant, cosmetic product or animal just before the hives began? Unsure  9. OTHER SYMPTOMS: Do you have any other symptoms? (e.g., fever, tongue swelling, difficulty breathing, abdomen pain) No     Reason for Disposition  Hives persist > 1 week  Protocols used: Hives-A-AH

## 2023-08-26 ENCOUNTER — Ambulatory Visit: Admitting: Family Medicine

## 2023-08-28 ENCOUNTER — Other Ambulatory Visit: Payer: Self-pay | Admitting: Family Medicine

## 2023-08-28 NOTE — Telephone Encounter (Signed)
 Requested Prescriptions  Pending Prescriptions Disp Refills   naproxen  (NAPROSYN ) 500 MG tablet [Pharmacy Med Name: NAPROXEN  500MG  TABLETS] 180 tablet 1    Sig: TAKE 1 TABLET(500 MG) BY MOUTH TWICE DAILY WITH A MEAL     Analgesics:  NSAIDS Failed - 08/28/2023  5:19 PM      Failed - Manual Review: Labs are only required if the patient has taken medication for more than 8 weeks.      Failed - Valid encounter within last 12 months    Recent Outpatient Visits           1 week ago Rash   [place] [facility] [place] [place], NP              Passed - Cr in normal range and within 360 days    Creatinine  Date Value Ref Range Status  08/26/2022 68.6 20.0 - 300.0 mg/dL Final   Creatinine, Ser  Date Value Ref Range Status  02/27/2023 0.87 0.57 - 1.00 mg/dL Final         Passed - HGB in normal range and within 360 days    Hemoglobin  Date Value Ref Range Status  02/27/2023 11.8 11.1 - 15.9 g/dL Final         Passed - PLT in normal range and within 360 days    Platelets  Date Value Ref Range Status  02/27/2023 434 150 - 450 x10E3/uL Final         Passed - HCT in normal range and within 360 days    Hematocrit  Date Value Ref Range Status  02/27/2023 36.8 34.0 - 46.6 % Final         Passed - eGFR is 30 or above and within 360 days    GFR calc Af Amer  Date Value Ref Range Status  03/26/2020 139 >59 mL/min/1.73 Final    Comment:    **In accordance with recommendations from the NKF-ASN Task force,**   [facility] is in the process of updating its eGFR calculation to the   2021 CKD-EPI creatinine equation that estimates kidney function   without a race variable.    GFR, Estimated  Date Value Ref Range Status  11/06/2021 >60 >60 mL/min Final    Comment:    (NOTE) Calculated using the CKD-EPI Creatinine Equation (2021)    eGFR  Date Value Ref Range Status  02/27/2023 92 >59 mL/min/1.73 Final         Passed - Patient is not pregnant

## 2023-09-21 ENCOUNTER — Ambulatory Visit: Admitting: Family Medicine

## 2023-09-21 ENCOUNTER — Encounter: Payer: Self-pay | Admitting: Family Medicine

## 2023-09-21 VITALS — BP 145/87 | HR 87 | Temp 98.2°F | Ht 67.0 in | Wt 245.0 lb

## 2023-09-21 DIAGNOSIS — E781 Pure hyperglyceridemia: Secondary | ICD-10-CM | POA: Diagnosis not present

## 2023-09-21 DIAGNOSIS — R0683 Snoring: Secondary | ICD-10-CM

## 2023-09-21 DIAGNOSIS — R232 Flushing: Secondary | ICD-10-CM

## 2023-09-21 DIAGNOSIS — R21 Rash and other nonspecific skin eruption: Secondary | ICD-10-CM

## 2023-09-21 DIAGNOSIS — Z862 Personal history of diseases of the blood and blood-forming organs and certain disorders involving the immune mechanism: Secondary | ICD-10-CM | POA: Diagnosis not present

## 2023-09-21 MED ORDER — TRELEGY ELLIPTA 100-62.5-25 MCG/ACT IN AEPB: 1.0000 | INHALATION_SPRAY | Freq: Every day | RESPIRATORY_TRACT | 12 refills | Status: AC

## 2023-09-21 MED ORDER — FENOFIBRATE 145 MG PO TABS: 145.0000 mg | ORAL_TABLET | Freq: Every day | ORAL | 1 refills | Status: DC

## 2023-09-21 MED ORDER — MONTELUKAST SODIUM 10 MG PO TABS: 10.0000 mg | ORAL_TABLET | Freq: Every day | ORAL | 1 refills | Status: DC

## 2023-09-21 MED ORDER — ONDANSETRON HCL 4 MG PO TABS: 4.0000 mg | ORAL_TABLET | Freq: Three times a day (TID) | ORAL | 3 refills | Status: AC | PRN

## 2023-09-21 MED ORDER — OMEPRAZOLE 40 MG PO CPDR: DELAYED_RELEASE_CAPSULE | ORAL | 1 refills | Status: AC

## 2023-09-21 NOTE — Progress Notes (Signed)
 BP (!) 145/87   Pulse 87   Temp 98.2 F (36.8 C) (Oral)   Ht 5' 7 (1.702 m)   Wt 245 lb (111.1 kg)   SpO2 96%   BMI 38.37 kg/m    Subjective:    Patient ID: Mariah Gonzalez, female    DOB: 03-31-1990, 33 y.o.   MRN: [id number]  HPI: Mariah Gonzalez is a 33 y.o. female  Chief Complaint  Patient presents with   Rash   HYPERLIPIDEMIA Hyperlipidemia status: excellent compliance Satisfied with current treatment?  yes Side effects:  no Medication compliance: excellent compliance Past cholesterol meds: fenofibrate  Supplements: none Aspirin:  no The ASCVD Risk score (Arnett DK, et al., 2019) failed to calculate for the following reasons:   The 2019 ASCVD risk score is only valid for ages 40 to 77 Chest pain:  no Coronary artery disease:  no Family history CAD:  no  Rash- nearly resolved.  ANEMIA Anemia status: stable Etiology of anemia: iron  deficiency Duration of anemia treatment: chronic Compliance with treatment: good compliance Iron  supplementation side effects: no Severity of anemia: moderate Fatigue: yes Decreased exercise tolerance: yes  Dyspnea on exertion: yes Palpitations: yes Bleeding: no Pica: no  ??? SLEEP APNEA Sleep apnea status: unsure Duration: chronic Satisfied with current treatment?:  no CPAP use:  no Sleep quality with CPAP use: N/A Wakes feeling refreshed:  no Daytime hypersomnolence:  yes Fatigue:  yes Insomnia:  yes Good sleep hygiene:  yes Difficulty falling asleep:  yes Difficulty staying asleep:  yes Snoring bothers bed partner:  yes Observed apnea by bed partner: yes Obesity:  yes Hypertension: no  Pulmonary hypertension:  no Coronary artery disease:  no  Relevant past medical, surgical, family and social history reviewed and updated as indicated. Interim medical history since our last visit reviewed. Allergies and medications reviewed and updated.  Review of Systems  Constitutional: Negative.    Respiratory: Negative.    Cardiovascular: Negative.   Musculoskeletal: Negative.   Neurological: Negative.   Psychiatric/Behavioral: Negative.      Per HPI unless specifically indicated above     Objective:    BP (!) 145/87   Pulse 87   Temp 98.2 F (36.8 C) (Oral)   Ht 5' 7 (1.702 m)   Wt 245 lb (111.1 kg)   SpO2 96%   BMI 38.37 kg/m   Wt Readings from Last 3 Encounters:  09/21/23 245 lb (111.1 kg)  08/19/23 245 lb 12.8 oz (111.5 kg)  03/27/23 244 lb 9.6 oz (110.9 kg)    Physical Exam Vitals and nursing note reviewed.  Constitutional:      General: She is not in acute distress.    Appearance: Normal appearance. She is not ill-appearing, toxic-appearing or diaphoretic.  HENT:     Head: Normocephalic and atraumatic.     Right Ear: External ear normal.     Left Ear: External ear normal.     Nose: Nose normal.     Mouth/Throat:     Mouth: Mucous membranes are moist.     Pharynx: Oropharynx is clear.  Eyes:     General: No scleral icterus.       Right eye: No discharge.        Left eye: No discharge.     Extraocular Movements: Extraocular movements intact.     Conjunctiva/sclera: Conjunctivae normal.     Pupils: Pupils are equal, round, and reactive to light.  Cardiovascular:     Rate and Rhythm: Normal rate  and regular rhythm.     Pulses: Normal pulses.     Heart sounds: Normal heart sounds. No murmur heard.    No friction rub. No gallop.  Pulmonary:     Effort: Pulmonary effort is normal. No respiratory distress.     Breath sounds: Normal breath sounds. No stridor. No wheezing, rhonchi or rales.  Chest:     Chest wall: No tenderness.  Musculoskeletal:        General: Normal range of motion.     Cervical back: Normal range of motion and neck supple.  Skin:    General: Skin is warm and dry.     Capillary Refill: Capillary refill takes less than 2 seconds.     Coloration: Skin is not jaundiced or pale.     Findings: No bruising, erythema, lesion or rash.   Neurological:     General: No focal deficit present.     Mental Status: She is alert and oriented to person, place, and time. Mental status is at baseline.  Psychiatric:        Mood and Affect: Mood normal.        Behavior: Behavior normal.        Thought Content: Thought content normal.        Judgment: Judgment normal.     Results for orders placed or performed in visit on 09/21/23  Bayer DCA Hb A1c Waived   Collection Time: 09/21/23  4:42 PM  Result Value Ref Range   HB A1C (BAYER DCA - WAIVED) 5.4 4.8 - 5.6 %  CBC with Differential/Platelet   Collection Time: 09/21/23  4:46 PM  Result Value Ref Range   WBC 9.8 3.4 - 10.8 x10E3/uL   RBC 4.61 3.77 - 5.28 x10E6/uL   Hemoglobin 11.1 11.1 - 15.9 g/dL   Hematocrit 63.9 65.9 - 46.6 %   MCV 78 (L) 79 - 97 fL   MCH 24.1 (L) 26.6 - 33.0 pg   MCHC 30.8 (L) 31.5 - 35.7 g/dL   RDW 85.0 88.2 - 84.5 %   Platelets 463 (H) 150 - 450 x10E3/uL   Neutrophils 63 Not Estab. %   Lymphs 28 Not Estab. %   Monocytes 7 Not Estab. %   Eos 1 Not Estab. %   Basos 1 Not Estab. %   Neutrophils Absolute 6.2 1.4 - 7.0 x10E3/uL   Lymphocytes Absolute 2.8 0.7 - 3.1 x10E3/uL   Monocytes Absolute 0.7 0.1 - 0.9 x10E3/uL   EOS (ABSOLUTE) 0.1 0.0 - 0.4 x10E3/uL   Basophils Absolute 0.1 0.0 - 0.2 x10E3/uL   Immature Granulocytes 0 Not Estab. %   Immature Grans (Abs) 0.0 0.0 - 0.1 x10E3/uL  Comprehensive metabolic panel with GFR   Collection Time: 09/21/23  4:46 PM  Result Value Ref Range   Glucose 94 70 - 99 mg/dL   BUN 13 6 - 20 mg/dL   Creatinine, Ser 9.20 0.57 - 1.00 mg/dL   eGFR 897 >40 fO/fpw/8.26   BUN/Creatinine Ratio 16 9 - 23   Sodium 136 134 - 144 mmol/L   Potassium 4.5 3.5 - 5.2 mmol/L   Chloride 103 96 - 106 mmol/L   CO2 21 20 - 29 mmol/L   Calcium 9.2 8.7 - 10.2 mg/dL   Total Protein 6.7 6.0 - 8.5 g/dL   Albumin 4.4 3.9 - 4.9 g/dL   Globulin, Total 2.3 1.5 - 4.5 g/dL   Bilirubin Total <9.7 0.0 - 1.2 mg/dL   Alkaline Phosphatase 71  44 -  121 IU/L   AST 15 0 - 40 IU/L   ALT 16 0 - 32 IU/L  VITAMIN D  25 Hydroxy (Vit-D Deficiency, Fractures)   Collection Time: 09/21/23  4:46 PM  Result Value Ref Range   Vit D, 25-Hydroxy 26.2 (L) 30.0 - 100.0 ng/mL  TSH   Collection Time: 09/21/23  4:46 PM  Result Value Ref Range   TSH 1.440 0.450 - 4.500 uIU/mL  B12   Collection Time: 09/21/23  4:46 PM  Result Value Ref Range   Vitamin B-12 837 232 - 1,245 pg/mL  FSH/LH   Collection Time: 09/21/23  4:46 PM  Result Value Ref Range   LH 16.9 mIU/mL   FSH 6.0 mIU/mL  Iron  Binding Cap (TIBC)([facility])   Collection Time: 09/21/23  4:46 PM  Result Value Ref Range   Total Iron  Binding Capacity 423 250 - 450 ug/dL   UIBC 599 868 - 574 ug/dL   Iron  23 (L) 27 - 159 ug/dL   Iron  Saturation 5 (LL) 15 - 55 %  Ferritin   Collection Time: 09/21/23  4:46 PM  Result Value Ref Range   Ferritin 11 (L) 15 - 150 ng/mL  Estradiol    Collection Time: 09/21/23  4:46 PM  Result Value Ref Range   Estradiol  125.0 pg/mL  Testosterone , free, total([facility])   Collection Time: 09/21/23  4:46 PM  Result Value Ref Range   Testosterone  9 8 - 60 ng/dL   Testosterone , Free 0.3 0.0 - 4.2 pg/mL   Sex Hormone Binding 22.6 (L) 24.6 - 122.0 nmol/L  Prolactin   Collection Time: 09/21/23  4:46 PM  Result Value Ref Range   Prolactin 9.0 4.8 - 33.4 ng/mL  Magnesium   Collection Time: 09/21/23  4:46 PM  Result Value Ref Range   Magnesium 1.9 1.6 - 2.3 mg/dL  Phosphorus   Collection Time: 09/21/23  4:46 PM  Result Value Ref Range   Phosphorus 2.7 (L) 3.0 - 4.3 mg/dL  Lipid Panel w/o Chol/HDL Ratio   Collection Time: 09/21/23  4:46 PM  Result Value Ref Range   Cholesterol, Total 191 100 - 199 mg/dL   Triglycerides 899 0 - 149 mg/dL   HDL 60 >60 mg/dL   VLDL Cholesterol Cal 18 5 - 40 mg/dL   LDL Chol Calc (NIH) 886 (H) 0 - 99 mg/dL      Assessment & Plan:   Problem List Items Addressed This Visit       Musculoskeletal and  Integument   Rash   Resolved. Continue to monitor.         Other   Hypertriglyceridemia (Chronic)   Rechecking labs today. Await results. Treat as needed.       Relevant Medications   fenofibrate  (TRICOR ) 145 MG tablet   Other Relevant Orders   Bayer DCA Hb A1c Waived (Completed)   CBC with Differential/Platelet (Completed)   Comprehensive metabolic panel with GFR (Completed)   VITAMIN D  25 Hydroxy (Vit-D Deficiency, Fractures) (Completed)   Lipid Panel w/o Chol/HDL Ratio (Completed)   History of anemia - Primary   Rechecking labs today. Await results. Treat as needed.       Relevant Orders   Bayer DCA Hb A1c Waived (Completed)   CBC with Differential/Platelet (Completed)   Comprehensive metabolic panel with GFR (Completed)   VITAMIN D  25 Hydroxy (Vit-D Deficiency, Fractures) (Completed)   TSH (Completed)   B12 (Completed)   Iron  Binding Cap (TIBC)([facility]) (Completed)   Ferritin (Completed)   Other Visit Diagnoses  Hot flashes       Will check labs. Await results. Treat as needed.   Relevant Medications   fenofibrate  (TRICOR ) 145 MG tablet   Other Relevant Orders   Bayer DCA Hb A1c Waived (Completed)   CBC with Differential/Platelet (Completed)   Comprehensive metabolic panel with GFR (Completed)   VITAMIN D  25 Hydroxy (Vit-D Deficiency, Fractures) (Completed)   FSH/LH (Completed)   Estradiol  (Completed)   Testosterone , free, total([facility]) (Completed)   Prolactin (Completed)   Magnesium (Completed)   Phosphorus (Completed)     Snoring       Will check for OSA with sleep study. Await results.   Relevant Orders   Ambulatory referral to Sleep Studies        Follow up plan: Return in about 6 months (around 03/23/2024) for physical.

## 2023-09-22 LAB — BAYER DCA HB A1C WAIVED: HB A1C (BAYER DCA - WAIVED): 5.4 % (ref 4.8–5.6)

## 2023-09-25 LAB — IRON AND TIBC
Iron Saturation: 5 % — CL (ref 15–55)
Iron: 23 ug/dL — ABNORMAL LOW (ref 27–159)
Total Iron Binding Capacity: 423 ug/dL (ref 250–450)
UIBC: 400 ug/dL (ref 131–425)

## 2023-09-25 LAB — PROLACTIN: Prolactin: 9 ng/mL (ref 4.8–33.4)

## 2023-09-25 LAB — COMPREHENSIVE METABOLIC PANEL WITH GFR
ALT: 16 IU/L (ref 0–32)
AST: 15 IU/L (ref 0–40)
Albumin: 4.4 g/dL (ref 3.9–4.9)
Alkaline Phosphatase: 71 IU/L (ref 44–121)
BUN/Creatinine Ratio: 16 (ref 9–23)
BUN: 13 mg/dL (ref 6–20)
Bilirubin Total: <0.2 mg/dL (ref 0.0–1.2)
CO2: 21 mmol/L (ref 20–29)
Calcium: 9.2 mg/dL (ref 8.7–10.2)
Chloride: 103 mmol/L (ref 96–106)
Creatinine, Ser: 0.79 mg/dL (ref 0.57–1.00)
Globulin, Total: 2.3 g/dL (ref 1.5–4.5)
Glucose: 94 mg/dL (ref 70–99)
Potassium: 4.5 mmol/L (ref 3.5–5.2)
Sodium: 136 mmol/L (ref 134–144)
Total Protein: 6.7 g/dL (ref 6.0–8.5)
eGFR: 102 mL/min/1.73 (ref >59)

## 2023-09-25 LAB — CBC WITH DIFFERENTIAL/PLATELET
Basophils Absolute: 0.1 x10E3/uL (ref 0.0–0.2)
Basos: 1 %
EOS (ABSOLUTE): 0.1 x10E3/uL (ref 0.0–0.4)
Eos: 1 %
Hematocrit: 36 % (ref 34.0–46.6)
Hemoglobin: 11.1 g/dL (ref 11.1–15.9)
Immature Grans (Abs): 0 x10E3/uL (ref 0.0–0.1)
Immature Granulocytes: 0 %
Lymphocytes Absolute: 2.8 x10E3/uL (ref 0.7–3.1)
Lymphs: 28 %
MCH: 24.1 pg — ABNORMAL LOW (ref 26.6–33.0)
MCHC: 30.8 g/dL — ABNORMAL LOW (ref 31.5–35.7)
MCV: 78 fL — ABNORMAL LOW (ref 79–97)
Monocytes Absolute: 0.7 x10E3/uL (ref 0.1–0.9)
Monocytes: 7 %
Neutrophils Absolute: 6.2 x10E3/uL (ref 1.4–7.0)
Neutrophils: 63 %
Platelets: 463 x10E3/uL — ABNORMAL HIGH (ref 150–450)
RBC: 4.61 x10E6/uL (ref 3.77–5.28)
RDW: 14.9 % (ref 11.7–15.4)
WBC: 9.8 x10E3/uL (ref 3.4–10.8)

## 2023-09-25 LAB — PHOSPHORUS: Phosphorus: 2.7 mg/dL — ABNORMAL LOW (ref 3.0–4.3)

## 2023-09-25 LAB — LIPID PANEL W/O CHOL/HDL RATIO
Cholesterol, Total: 191 mg/dL (ref 100–199)
HDL: 60 mg/dL (ref >39)
LDL Chol Calc (NIH): 113 mg/dL — ABNORMAL HIGH (ref 0–99)
Triglycerides: 100 mg/dL (ref 0–149)
VLDL Cholesterol Cal: 18 mg/dL (ref 5–40)

## 2023-09-25 LAB — MAGNESIUM: Magnesium: 1.9 mg/dL (ref 1.6–2.3)

## 2023-09-25 LAB — FERRITIN: Ferritin: 11 ng/mL — ABNORMAL LOW (ref 15–150)

## 2023-09-25 LAB — TESTOSTERONE, FREE, TOTAL, SHBG
Sex Hormone Binding: 22.6 nmol/L — ABNORMAL LOW (ref 24.6–122.0)
Testosterone, Free: 0.3 pg/mL (ref 0.0–4.2)
Testosterone: 9 ng/dL (ref 8–60)

## 2023-09-25 LAB — TSH: TSH: 1.44 u[IU]/mL (ref 0.450–4.500)

## 2023-09-25 LAB — VITAMIN B12: Vitamin B-12: 837 pg/mL (ref 232–1245)

## 2023-09-25 LAB — FSH/LH
FSH: 6 m[IU]/mL
LH: 16.9 m[IU]/mL

## 2023-09-25 LAB — VITAMIN D 25 HYDROXY (VIT D DEFICIENCY, FRACTURES): Vit D, 25-Hydroxy: 26.2 ng/mL — ABNORMAL LOW (ref 30.0–100.0)

## 2023-09-25 LAB — ESTRADIOL: Estradiol: 125 pg/mL

## 2023-09-27 ENCOUNTER — Ambulatory Visit: Payer: Self-pay | Admitting: Family Medicine

## 2023-09-27 MED ORDER — IRON (FERROUS SULFATE) 325 (65 FE) MG PO TABS: 325.0000 mg | ORAL_TABLET | Freq: Two times a day (BID) | ORAL | 3 refills | Status: DC

## 2023-09-27 NOTE — Assessment & Plan Note (Signed)
 Rechecking labs today. Await results. Treat as needed.

## 2023-09-27 NOTE — Assessment & Plan Note (Signed)
 Resolved.  Continue to monitor.

## 2023-09-29 ENCOUNTER — Other Ambulatory Visit: Payer: Self-pay | Admitting: Medical

## 2023-10-02 MED ORDER — METOPROLOL SUCCINATE ER 25 MG PO TB24: ORAL_TABLET | ORAL | 3 refills | Status: AC

## 2023-10-19 ENCOUNTER — Other Ambulatory Visit: Payer: Self-pay | Admitting: Family Medicine

## 2023-10-19 ENCOUNTER — Encounter: Payer: Self-pay | Admitting: Family Medicine

## 2023-10-19 DIAGNOSIS — D509 Iron deficiency anemia, unspecified: Secondary | ICD-10-CM

## 2023-11-12 ENCOUNTER — Encounter: Payer: Self-pay | Admitting: Family Medicine

## 2023-11-17 ENCOUNTER — Other Ambulatory Visit

## 2023-11-17 ENCOUNTER — Ambulatory Visit: Admitting: Oncology

## 2023-11-20 ENCOUNTER — Other Ambulatory Visit: Payer: Self-pay | Admitting: Family Medicine

## 2023-11-23 ENCOUNTER — Inpatient Hospital Stay: Admitting: Oncology

## 2023-11-23 ENCOUNTER — Ambulatory Visit: Payer: Self-pay | Admitting: Oncology

## 2023-11-23 ENCOUNTER — Encounter: Payer: Self-pay | Admitting: Oncology

## 2023-11-23 ENCOUNTER — Inpatient Hospital Stay: Attending: Oncology

## 2023-11-23 VITALS — BP 146/89 | HR 88 | Temp 97.0°F | Resp 18 | Wt 246.0 lb

## 2023-11-23 DIAGNOSIS — R110 Nausea: Secondary | ICD-10-CM | POA: Insufficient documentation

## 2023-11-23 DIAGNOSIS — E611 Iron deficiency: Secondary | ICD-10-CM

## 2023-11-23 DIAGNOSIS — Z803 Family history of malignant neoplasm of breast: Secondary | ICD-10-CM | POA: Diagnosis not present

## 2023-11-23 DIAGNOSIS — D509 Iron deficiency anemia, unspecified: Secondary | ICD-10-CM | POA: Insufficient documentation

## 2023-11-23 DIAGNOSIS — Z87891 Personal history of nicotine dependence: Secondary | ICD-10-CM | POA: Insufficient documentation

## 2023-11-23 DIAGNOSIS — N920 Excessive and frequent menstruation with regular cycle: Secondary | ICD-10-CM | POA: Insufficient documentation

## 2023-11-23 LAB — CBC WITH DIFFERENTIAL/PLATELET
Abs Immature Granulocytes: 0.02 K/uL (ref 0.00–0.07)
Basophils Absolute: 0.1 K/uL (ref 0.0–0.1)
Basophils Relative: 1 %
Eosinophils Absolute: 0.1 K/uL (ref 0.0–0.5)
Eosinophils Relative: 1 %
HCT: 39.7 % (ref 36.0–46.0)
Hemoglobin: 12.5 g/dL (ref 12.0–15.0)
Immature Granulocytes: 0 %
Lymphocytes Relative: 27 %
Lymphs Abs: 2.8 K/uL (ref 0.7–4.0)
MCH: 25.4 pg — ABNORMAL LOW (ref 26.0–34.0)
MCHC: 31.5 g/dL (ref 30.0–36.0)
MCV: 80.7 fL (ref 80.0–100.0)
Monocytes Absolute: 0.6 K/uL (ref 0.1–1.0)
Monocytes Relative: 6 %
Neutro Abs: 6.9 K/uL (ref 1.7–7.7)
Neutrophils Relative %: 65 %
Platelets: 379 K/uL (ref 150–400)
RBC: 4.92 MIL/uL (ref 3.87–5.11)
RDW: 15.8 % — ABNORMAL HIGH (ref 11.5–15.5)
WBC: 10.4 K/uL (ref 4.0–10.5)
nRBC: 0 % (ref 0.0–0.2)

## 2023-11-23 LAB — RETIC PANEL
Immature Retic Fract: 12.4 % (ref 2.3–15.9)
RBC.: 4.97 MIL/uL (ref 3.87–5.11)
Retic Count, Absolute: 57.2 K/uL (ref 19.0–186.0)
Retic Ct Pct: 1.2 % (ref 0.4–3.1)
Reticulocyte Hemoglobin: 27 pg — ABNORMAL LOW (ref >27.9)

## 2023-11-23 LAB — IRON AND TIBC
Iron: 48 ug/dL (ref 28–170)
Saturation Ratios: 11 % (ref 10.4–31.8)
TIBC: 442 ug/dL (ref 250–450)
UIBC: 394 ug/dL

## 2023-11-23 LAB — FERRITIN: Ferritin: 8 ng/mL — ABNORMAL LOW (ref 11–307)

## 2023-11-23 NOTE — Addendum Note (Signed)
 Addended by: BABARA CALL on: 11/23/2023 09:34 PM   Modules accepted: Orders

## 2023-11-23 NOTE — Telephone Encounter (Signed)
 Requested Prescriptions  Pending Prescriptions Disp Refills   montelukast  (SINGULAIR ) 10 MG tablet [Pharmacy Med Name: MONTELUKAST  10MG  TABLETS] 90 tablet 1    Sig: TAKE 1 TABLET(10 MG) BY MOUTH AT BEDTIME     Pulmonology:  Leukotriene Inhibitors Failed - 11/23/2023  2:59 PM      Failed - Valid encounter within last 12 months    Recent Outpatient Visits           2 months ago History of anemia   [place] [facility] [place], Megan P, DO   3 months ago Rash   [place] [facility] [place] [facility], Melanie DASEN, NP

## 2023-11-23 NOTE — Progress Notes (Signed)
 Hematology/Oncology Progress note [phone] [phone]      Patient Care Team: Vicci Duwaine SQUIBB, DO as PCP - General (Family Medicine) Anner Alm ORN, MD as PCP - Cardiology (Cardiology) Babara Call, MD as Consulting Physician (Hematology) Anner Alm ORN, MD as Consulting Physician (Cardiology)  REFERRING PROVIDER: Vicci Duwaine SQUIBB, DO CHIEF COMPLAINTS/REASON FOR VISIT:  iron  deficiency anemia   ASSESSMENT & PLAN:   Iron  deficiency Patient has previously received IV Venofer  treatments overall tolerated well except nausea and vomiting after Venofer .  It was felt that persistent symptoms are not typical for side effects secondary to IV Venofer  treatments. Patient reports that she has had intermittent nausea and uses antiemetics as needed. Lab Results  Component Value Date   HGB 12.5 11/23/2023   TIBC 442 11/23/2023   IRONPCTSAT 11 11/23/2023   FERRITIN 8 (L) 11/23/2023     Today's blood work showed ferritin decreased at 8.  Normal hemoglobin. Recommend IV Venofer  weekly x 2.  Chronic nausea This is a chronic issue.  Not no history of breast. Patient is on antiemetics as needed.   Orders Placed This Encounter  Procedures   Ferritin    Standing Status:   Future    Number of Occurrences:   1    Expected Date:   11/23/2023    Expiration Date:   05/23/2024   Iron  and TIBC    Standing Status:   Future    Number of Occurrences:   1    Expected Date:   11/23/2023    Expiration Date:   11/22/2024   CBC with Differential/Platelet    Standing Status:   Future    Number of Occurrences:   1    Expected Date:   11/23/2023    Expiration Date:   11/22/2024   Retic Panel    Standing Status:   Future    Number of Occurrences:   1    Expected Date:   11/23/2023    Expiration Date:   11/22/2024   Follow-up in 6 months All questions were answered. The patient knows to call the clinic with any problems, questions or concerns.  Call Babara, MD, PhD [facility]  [facility] Hematology Oncology 11/23/2023   HISTORY OF PRESENTING ILLNESS:  Mariah Gonzalez is a  33 y.o.  female with PMH listed below was seen in consultation at the request of Vicci Duwaine SQUIBB, DO  for evaluation of iron  deficiency anemia.   Reviewed patient's recent labs  02/07/2021 labs revealed anemia with hemoglobin of 11.2 [reference 11.1-15.9g/dl] MCV 75, white count 85.6, normal platelet count 358,000. Iron  saturation 6, ferritin of 9.  Patient reports heavy menstrual period, also chronic pelvic pain.  She is currently not following up gynecology after Dr. Neomi relocated. Reports feeling tired and fatigued. She recalls having some bleeding complication after tonsil resection.  Recent blood work also showed extremely high triglyceride, >1000.  Primary care provider has prescribed fenofibrate . History of IBS, has previously tried oral iron  supplementation, not able to tolerate due to GI side effects.  INTERVAL HISTORY Mariah Gonzalez is a 33 y.o. female who has above history reviewed by me today presents for follow up visit iron  deficiency anemia.  Patient presents to establish care.  Last seen in 2023. Previously she tolerated IV Venofer  treatments except intermittent nausea and vomiting symptoms. Today patient reports that she has chronic nausea and takes antiemetics as needed. Patient reports that her menstrual period is more regular now.   Review of Systems  Constitutional:  Positive for fatigue. Negative for appetite change, chills and fever.  HENT:   Negative for hearing loss and voice change.   Eyes:  Negative for eye problems.  Respiratory:  Negative for chest tightness and cough.   Cardiovascular:  Negative for chest pain.  Gastrointestinal:  Positive for nausea. Negative for abdominal distention, abdominal pain and blood in stool.  Endocrine: Negative for hot flashes.  Genitourinary:  Positive for menstrual problem. Negative for difficulty urinating  and frequency.        Chronic pelvic pain  Musculoskeletal:  Negative for arthralgias.  Skin:  Negative for itching and rash.  Neurological:  Negative for extremity weakness.  Hematological:  Negative for adenopathy.  Psychiatric/Behavioral:  Negative for confusion.     MEDICAL HISTORY:  Past Medical History:  Diagnosis Date   ADHD    Anxiety    Asthma    Depressed    DUB (dysfunctional uterine bleeding)    Dysfunctional uterine bleeding    GERD (gastroesophageal reflux disease)    High cholesterol    History of COVID-19 01/24/2021   Hypertension    IBS (irritable bowel syndrome)    IDA (iron  deficiency anemia)    LGSIL on Pap smear of cervix    Migraine without aura and without status migrainosus, not intractable    Pelvic pain in female     SURGICAL HISTORY: Past Surgical History:  Procedure Laterality Date   debridement of rught eye     ESOPHAGOGASTRODUODENOSCOPY (EGD) WITH PROPOFOL  N/A 08/05/2021   Procedure: ESOPHAGOGASTRODUODENOSCOPY (EGD) WITH PROPOFOL ;  Surgeon: Unk Corinn Skiff, MD;  Location: [facility] END[facility];  Service: Gastroenterology;  Laterality: N/A;   gum grafting     TONSILLECTOMY Bilateral 07/03/2020   Procedure: TONSILLECTOMY;  Surgeon: Blair Mt, MD;  Location: [facility];  Service: ENT;  Laterality: Bilateral;   WISDOM TOOTH EXTRACTION      SOCIAL HISTORY: Social History   Socioeconomic History   Marital status: Married    Spouse name: Not on file   Number of children: Not on file   Years of education: Not on file   Highest education level: Bachelor's degree (e.g., BA, AB, BS)  Occupational History   Occupation: Runner, broadcasting/film/video - for the [employer]    Comment: Sign Language -  Tobacco Use   Smoking status: Former    Current packs/day: 0.00    Average packs/day: 0.5 packs/day for 2.0 years (1.0 ttl pk-yrs)    Types: Cigarettes    Quit date: 10/08/2022    Years since quitting: 1.1    Passive exposure: Never   Smokeless tobacco: Never   Vaping Use   Vaping status: Never Used  Substance and Sexual Activity   Alcohol use: Yes    Comment: occasionally   Drug use: Yes    Types: Marijuana, Hydromorphone    Comment: Delta 8- chewed a gummy on 02/13/2022   Sexual activity: Yes  Other Topics Concern   Not on file  Social History Narrative   Not on file   Social Drivers of Health   Financial Resource Strain: Medium Risk (03/13/2023)   Overall Financial Resource Strain (CARDIA)    Difficulty of Paying Living Expenses: Somewhat hard  Food Insecurity: Food Insecurity Present (03/13/2023)   Hunger Vital Sign    Worried About Running Out of Food in the Last Year: Often true    Ran Out of Food in the Last Year: Sometimes true  Transportation Needs: No Transportation Needs (03/13/2023)   PRAPARE - Transportation  Lack of Transportation (Medical): No    Lack of Transportation (Non-Medical): No  Physical Activity: Insufficiently Active (03/13/2023)   Exercise Vital Sign    Days of Exercise per Week: 3 days    Minutes of Exercise per Session: 30 min  Stress: Stress Concern Present (03/13/2023)   [employer] of Occupational Health - Occupational Stress Questionnaire    Feeling of Stress : To some extent  Social Connections: Moderately Integrated (03/13/2023)   Social Connection and Isolation Panel    Frequency of Communication with Friends and Family: More than three times a week    Frequency of Social Gatherings with Friends and Family: Once a week    Attends Religious Services: Never    Database administrator or Organizations: Yes    Attends Engineer, structural: More than 4 times per year    Marital Status: Married  Catering manager Violence: Not on file    FAMILY HISTORY: Family History  Problem Relation Age of Onset   Hyperlipidemia Mother    Hypertension Mother    Depression Mother    Hyperlipidemia Father    Hypertension Father    Mental illness Father    Heart attack Father 60       Had 2 heart  attacks   Stroke Father    Coronary artery disease Father    Diabetes Mellitus II Father    Melanoma Father    Hypertension Brother    Diabetes Paternal Grandfather    Heart attack Paternal Grandfather 21   Breast cancer Maternal Aunt    Breast cancer Maternal Aunt    Cancer Maternal Uncle    Breast cancer Paternal Aunt    Hearing loss Paternal Aunt    Cancer Paternal Uncle    Hearing loss Paternal Uncle    Diabetes Paternal Uncle    Heart attack Half-Brother 53   Coronary artery disease Half-Brother 51       CABG x4   Stroke Half-Brother 21    ALLERGIES:  is allergic to egg protein-containing drug products.  MEDICATIONS:  Current Outpatient Medications  Medication Sig Dispense Refill   albuterol  (PROVENTIL ) (2.5 MG/3ML) 0.083% nebulizer solution Take 3 mLs (2.5 mg total) by nebulization every 6 (six) hours as needed for wheezing or shortness of breath. 150 mL 6   albuterol  (VENTOLIN  HFA) 108 (90 Base) MCG/ACT inhaler Inhale 2 puffs into the lungs every 6 (six) hours as needed for wheezing or shortness of breath. 18 g 3   amphetamine-dextroamphetamine (ADDERALL XR) 10 MG 24 hr capsule Take 10 mg by mouth daily.     amphetamine-dextroamphetamine (ADDERALL) 5 MG tablet Take 1 tablet by mouth daily. Patient is taking 0.5 tablet     ARIPiprazole (ABILIFY) 5 MG tablet Take 10 mg by mouth daily.     baclofen  (LIORESAL ) 10 MG tablet TAKE 1/2 TO 1 TABLET(5 TO 10 MG) BY MOUTH THREE TIMES DAILY (Patient taking differently: As needed) 180 tablet 1   buPROPion ER (WELLBUTRIN SR) 100 MG 12 hr tablet Take 100 mg by mouth daily.     cyclobenzaprine  (FLEXERIL ) 10 MG tablet Take 1 tablet (10 mg total) by mouth at bedtime. (Patient taking differently: Take 10 mg by mouth at bedtime. As needed) 30 tablet 2   fenofibrate  (TRICOR ) 145 MG tablet Take 1 tablet (145 mg total) by mouth daily. 90 tablet 1   Fluticasone-Umeclidin-Vilant (TRELEGY ELLIPTA ) 100-62.5-25 MCG/ACT AEPB Inhale 1 puff into the lungs  daily. 60 each 12   hydrOXYzine  (ATARAX )  10 MG tablet Take 0.5-1 tablets (5-10 mg total) by mouth 3 (three) times daily as needed for itching. 30 tablet 0   lamoTRIgine  (LAMICTAL ) 150 MG tablet Take 150 mg by mouth every morning.     lamoTRIgine  (LAMICTAL ) 200 MG tablet TAKE 1 TABLET(200 MG) BY MOUTH DAILY 90 tablet 0   Lidocaine , Anorectal, 5 % GEL Apply 1 Application topically 2 (two) times daily as needed. 30 g 0   LORazepam  (ATIVAN ) 0.5 MG tablet Take 1 tablet (0.5 mg total) by mouth as directed. Take 1 tablet 1-2 times a day as needed for severe anxiety attacks only - please limit use 21 tablet 0   MELATONIN PO Take 1 each by mouth daily.     metoprolol  succinate (TOPROL -XL) 25 MG 24 hr tablet TAKE 1 TABLET (25 MG) BY MOUTH DAILY WITH SUPPER 90 tablet 3   montelukast  (SINGULAIR ) 10 MG tablet TAKE 1 TABLET(10 MG) BY MOUTH AT BEDTIME 90 tablet 1   naproxen  (NAPROSYN ) 500 MG tablet TAKE 1 TABLET(500 MG) BY MOUTH TWICE DAILY WITH A MEAL 180 tablet 1   omeprazole  (PRILOSEC) 40 MG capsule TAKE 1 CAPSULE(40 MG) BY MOUTH DAILY 90 capsule 1   ondansetron  (ZOFRAN ) 4 MG tablet Take 1 tablet (4 mg total) by mouth every 8 (eight) hours as needed for nausea or vomiting. 60 tablet 3   promethazine  (PHENERGAN ) 25 MG suppository Place 1 suppository (25 mg total) rectally every 6 (six) hours as needed for nausea or vomiting. 30 each 0   sucralfate  (CARAFATE ) 1 g tablet Take 1 g by mouth 4 (four) times daily.     traZODone  (DESYREL ) 150 MG tablet Take 150 mg by mouth at bedtime.     Iron , Ferrous Sulfate , 325 (65 Fe) MG TABS Take 325 mg by mouth 2 (two) times daily. (Patient not taking: Reported on 11/23/2023) 60 tablet 3   No current facility-administered medications for this visit.     PHYSICAL EXAMINATION: ECOG PERFORMANCE STATUS: 0 - Asymptomatic Vitals:   11/23/23 1523  BP: (!) 146/89  Pulse: 88  Resp: 18  Temp: (!) 97 F (36.1 C)  SpO2: 100%   Filed Weights   11/23/23 1523  Weight: 246 lb  (111.6 kg)    Physical Exam Constitutional:      General: She is not in acute distress.    Appearance: She is obese.  HENT:     Head: Normocephalic and atraumatic.  Eyes:     General: No scleral icterus. Cardiovascular:     Rate and Rhythm: Normal rate and regular rhythm.     Heart sounds: Normal heart sounds.  Pulmonary:     Effort: Pulmonary effort is normal. No respiratory distress.     Breath sounds: No wheezing.  Abdominal:     General: Bowel sounds are normal. There is no distension.     Palpations: Abdomen is soft.  Musculoskeletal:        General: No deformity. Normal range of motion.     Cervical back: Normal range of motion and neck supple.  Skin:    General: Skin is warm and dry.     Findings: No erythema or rash.  Neurological:     Mental Status: She is alert and oriented to person, place, and time. Mental status is at baseline.     Cranial Nerves: No cranial nerve deficit.     Coordination: Coordination normal.  Psychiatric:        Mood and Affect: Mood normal.  LABORATORY DATA:  I have reviewed the data as listed Lab Results  Component Value Date   WBC 10.4 11/23/2023   HGB 12.5 11/23/2023   HCT 39.7 11/23/2023   MCV 80.7 11/23/2023   PLT 379 11/23/2023   Recent Labs    02/27/23 1546 09/21/23 1646  NA 137 136  K 4.4 4.5  CL 100 103  CO2 22 21  GLUCOSE 82 94  BUN 11 13  CREATININE 0.87 0.79  CALCIUM 9.2 9.2  PROT 6.6 6.7  ALBUMIN 4.3 4.4  AST 19 15  ALT 20 16  ALKPHOS 61 71  BILITOT 0.2 <0.2   Iron /TIBC/Ferritin/ %Sat    Component Value Date/Time   IRON  48 11/23/2023 1553   IRON  23 (L) 09/21/2023 1646   TIBC 442 11/23/2023 1553   TIBC 423 09/21/2023 1646   FERRITIN 8 (L) 11/23/2023 1553   FERRITIN 11 (L) 09/21/2023 1646   IRONPCTSAT 11 11/23/2023 1553   IRONPCTSAT 5 (LL) 09/21/2023 1646     RADIOGRAPHIC STUDIES: I have personally reviewed the radiological images as listed and agreed with the findings in the report. No  results found.

## 2023-11-23 NOTE — Assessment & Plan Note (Signed)
 This is a chronic issue.  Not no history of breast. Patient is on antiemetics as needed.

## 2023-11-23 NOTE — Assessment & Plan Note (Signed)
 Patient has previously received IV Venofer  treatments overall tolerated well except nausea and vomiting after Venofer .  It was felt that persistent symptoms are not typical for side effects secondary to IV Venofer  treatments. Patient reports that she has had intermittent nausea and uses antiemetics as needed. Lab Results  Component Value Date   HGB 12.5 11/23/2023   TIBC 442 11/23/2023   IRONPCTSAT 11 11/23/2023   FERRITIN 8 (L) 11/23/2023     Today's blood work showed ferritin decreased at 8.  Normal hemoglobin. Recommend IV Venofer  weekly x 2.

## 2023-11-24 ENCOUNTER — Encounter: Payer: Self-pay | Admitting: Oncology

## 2023-11-24 NOTE — Telephone Encounter (Signed)
-----   Message from Zelphia Cap sent at 11/23/2023  9:35 PM EDT ----- Please arrange patient to get in.  Weekly x 2.  Follow-up 6 months lab prior to MD +/- Venofer .  Labs ordered.  Thank you ----- Message ----- From: Rebecka, Lab In [place] Sent: 11/23/2023   4:16 PM EDT To: Zelphia Cap, MD

## 2023-11-24 NOTE — Telephone Encounter (Signed)
 Rosina can you please schedule these appointments and notify patient of appointment dates and times.

## 2023-11-30 ENCOUNTER — Inpatient Hospital Stay

## 2023-11-30 VITALS — BP 130/90 | HR 82 | Temp 99.0°F | Resp 18

## 2023-11-30 DIAGNOSIS — E611 Iron deficiency: Secondary | ICD-10-CM

## 2023-11-30 DIAGNOSIS — D509 Iron deficiency anemia, unspecified: Secondary | ICD-10-CM | POA: Diagnosis not present

## 2023-11-30 MED ORDER — IRON SUCROSE 20 MG/ML IV SOLN
200.0000 mg | Freq: Once | INTRAVENOUS | Status: AC
  Administered 2023-11-30: 200 mg via INTRAVENOUS
  Filled 2023-11-30: qty 10

## 2023-11-30 NOTE — Patient Instructions (Signed)

## 2023-12-07 ENCOUNTER — Inpatient Hospital Stay

## 2023-12-07 VITALS — BP 146/93 | HR 85 | Temp 98.8°F | Resp 16

## 2023-12-07 DIAGNOSIS — E611 Iron deficiency: Secondary | ICD-10-CM

## 2023-12-07 DIAGNOSIS — D509 Iron deficiency anemia, unspecified: Secondary | ICD-10-CM | POA: Diagnosis not present

## 2023-12-07 MED ORDER — IRON SUCROSE 20 MG/ML IV SOLN
200.0000 mg | Freq: Once | INTRAVENOUS | Status: AC
  Administered 2023-12-07: 200 mg via INTRAVENOUS
  Filled 2023-12-07: qty 10

## 2023-12-07 NOTE — Patient Instructions (Signed)

## 2023-12-07 NOTE — Progress Notes (Signed)
 Pt refused urine pregnancy testing. Sates that she is lesbian and does not have intercourse with men.

## 2023-12-10 ENCOUNTER — Other Ambulatory Visit: Payer: Self-pay | Admitting: Family Medicine

## 2023-12-11 NOTE — Telephone Encounter (Signed)
 Requested medication (s) are due for refill today: routing for review  Requested medication (s) are on the active medication list: no  Last refill:  09/21/23  Future visit scheduled: no  Notes to clinic:  historical medication     Requested Prescriptions  Pending Prescriptions Disp Refills   sucralfate  (CARAFATE ) 1 g tablet [Pharmacy Med Name: SUCRALFATE  1GM TABLETS] 360 tablet     Sig: TAKE 1 TABLET(1 GRAM) BY MOUTH FOUR TIMES DAILY AT BEDTIME WITH MEALS     Gastroenterology: Antiacids Failed - 12/11/2023  3:50 PM      Failed - Valid encounter within last 12 months    Recent Outpatient Visits           2 months ago History of anemia   [place] [facility] [place], Megan P, DO   3 months ago Rash   [place] [facility] [place], Melanie DASEN, NP

## 2024-01-27 ENCOUNTER — Ambulatory Visit: Admitting: Family Medicine

## 2024-01-27 ENCOUNTER — Encounter: Payer: Self-pay | Admitting: Family Medicine

## 2024-01-27 VITALS — BP 133/95 | HR 108 | Temp 98.0°F | Ht 67.0 in | Wt 241.2 lb

## 2024-01-27 DIAGNOSIS — J4551 Severe persistent asthma with (acute) exacerbation: Secondary | ICD-10-CM | POA: Diagnosis not present

## 2024-01-27 DIAGNOSIS — J189 Pneumonia, unspecified organism: Secondary | ICD-10-CM | POA: Diagnosis not present

## 2024-01-27 DIAGNOSIS — R051 Acute cough: Secondary | ICD-10-CM | POA: Diagnosis not present

## 2024-01-27 LAB — POC COVID19/FLU A&B COMBO
Covid Antigen, POC: NEGATIVE
Influenza A Antigen, POC: NEGATIVE
Influenza B Antigen, POC: NEGATIVE

## 2024-01-27 MED ORDER — PREDNISONE 10 MG PO TABS: ORAL_TABLET | ORAL | 0 refills | Status: DC

## 2024-01-27 MED ORDER — AMOXICILLIN-POT CLAVULANATE 875-125 MG PO TABS: 1.0000 | ORAL_TABLET | Freq: Two times a day (BID) | ORAL | 0 refills | Status: DC

## 2024-01-27 MED ORDER — METHYLPREDNISOLONE SODIUM SUCC 40 MG IJ SOLR
40.0000 mg | Freq: Once | INTRAMUSCULAR | Status: AC
  Administered 2024-01-27: 10:00:00 40 mg via INTRAMUSCULAR

## 2024-01-27 NOTE — Assessment & Plan Note (Signed)
 Will treat with steroids. Recheck 2 weeks. Needs to see immunology- will discuss more when she is feeling better.

## 2024-01-27 NOTE — Progress Notes (Signed)
 BP (!) 166/98   Pulse (!) 118   Temp 98 F (36.7 C) (Oral)   Ht 5' 7 (1.702 m)   Wt 241 lb 3.2 oz (109.4 kg)   SpO2 97%   BMI 37.78 kg/m    Subjective:    Patient ID: Mariah Gonzalez, female    DOB: 1990-05-23, 33 y.o.   MRN: [id number]  HPI: Mariah Gonzalez is a 33 y.o. female  Chief Complaint  Patient presents with   Cough    Onset Sunday.    Generalized Body Aches        Ear Pain   UPPER RESPIRATORY TRACT INFECTION Duration:  4 days Worst symptom: cough Fever: no Cough: yes Shortness of breath: yes Wheezing: yes Chest pain: yes, with cough Chest tightness: yes Chest congestion: yes Nasal congestion: yes Runny nose: yes Post nasal drip: yes Sneezing: yes Sore throat: no Swollen glands: no Sinus pressure: no Headache: no Face pain: no Toothache: no Ear pain: no  Ear pressure: yes right Eyes red/itching:no Eye drainage/crusting: no  Vomiting: no Rash: no Fatigue: yes Sick contacts: yes Strep contacts: no  Context: worse Recurrent sinusitis: no Relief with OTC cold/cough medications: no  Treatments attempted: inhalers   Relevant past medical, surgical, family and social history reviewed and updated as indicated. Interim medical history since our last visit reviewed. Allergies and medications reviewed and updated.  Review of Systems  Constitutional:  Positive for chills, diaphoresis, fatigue and fever. Negative for activity change, appetite change and unexpected weight change.  HENT:  Positive for congestion, ear pain, postnasal drip and rhinorrhea. Negative for dental problem, drooling, ear discharge, facial swelling, hearing loss, mouth sores, nosebleeds, sinus pressure, sinus pain, sneezing, sore throat, tinnitus, trouble swallowing and voice change.   Eyes: Negative.   Respiratory:  Positive for cough, chest tightness, shortness of breath and wheezing. Negative for apnea, choking and stridor.   Cardiovascular: Negative.    Musculoskeletal: Negative.   Psychiatric/Behavioral: Negative.      Per HPI unless specifically indicated above     Objective:    BP (!) 166/98   Pulse (!) 118   Temp 98 F (36.7 C) (Oral)   Ht 5' 7 (1.702 m)   Wt 241 lb 3.2 oz (109.4 kg)   SpO2 97%   BMI 37.78 kg/m   Wt Readings from Last 3 Encounters:  01/27/24 241 lb 3.2 oz (109.4 kg)  11/23/23 246 lb (111.6 kg)  09/21/23 245 lb (111.1 kg)    Physical Exam Vitals and nursing note reviewed.  Constitutional:      General: She is not in acute distress.    Appearance: Normal appearance. She is diaphoretic. She is not ill-appearing or toxic-appearing.  HENT:     Head: Normocephalic and atraumatic.     Right Ear: Tympanic membrane, ear canal and external ear normal.     Left Ear: Tympanic membrane, ear canal and external ear normal.     Nose: Rhinorrhea present. No congestion.     Mouth/Throat:     Mouth: Mucous membranes are moist.     Pharynx: Oropharynx is clear. No oropharyngeal exudate or posterior oropharyngeal erythema.  Eyes:     General: No scleral icterus.       Right eye: No discharge.        Left eye: No discharge.     Extraocular Movements: Extraocular movements intact.     Conjunctiva/sclera: Conjunctivae normal.     Pupils: Pupils are equal, round, and reactive  to light.  Cardiovascular:     Rate and Rhythm: Normal rate and regular rhythm.     Pulses: Normal pulses.     Heart sounds: Normal heart sounds. No murmur heard.    No friction rub. No gallop.  Pulmonary:     Effort: Pulmonary effort is normal. No respiratory distress.     Breath sounds: No stridor. Rhonchi (RLL) present. No wheezing or rales.  Chest:     Chest wall: No tenderness.  Musculoskeletal:        General: Normal range of motion.     Cervical back: Normal range of motion and neck supple.  Skin:    General: Skin is warm.     Capillary Refill: Capillary refill takes less than 2 seconds.     Coloration: Skin is not jaundiced or  pale.     Findings: No bruising, erythema, lesion or rash.  Neurological:     General: No focal deficit present.     Mental Status: She is alert and oriented to person, place, and time. Mental status is at baseline.  Psychiatric:        Mood and Affect: Mood normal.        Behavior: Behavior normal.        Thought Content: Thought content normal.        Judgment: Judgment normal.     Results for orders placed or performed in visit on 11/23/23  Retic Panel   Collection Time: 11/23/23  3:53 PM  Result Value Ref Range   Retic Ct Pct 1.2 0.4 - 3.1 %   RBC. 4.97 3.87 - 5.11 MIL/uL   Retic Count, Absolute 57.2 19.0 - 186.0 K/uL   Immature Retic Fract 12.4 2.3 - 15.9 %   Reticulocyte Hemoglobin 27.0 (L) >27.9 pg  CBC with Differential/Platelet   Collection Time: 11/23/23  3:53 PM  Result Value Ref Range   WBC 10.4 4.0 - 10.5 K/uL   RBC 4.92 3.87 - 5.11 MIL/uL   Hemoglobin 12.5 12.0 - 15.0 g/dL   HCT 60.2 63.9 - 53.9 %   MCV 80.7 80.0 - 100.0 fL   MCH 25.4 (L) 26.0 - 34.0 pg   MCHC 31.5 30.0 - 36.0 g/dL   RDW 84.1 (H) 88.4 - 84.4 %   Platelets 379 150 - 400 K/uL   nRBC 0.0 0.0 - 0.2 %   Neutrophils Relative % 65 %   Neutro Abs 6.9 1.7 - 7.7 K/uL   Lymphocytes Relative 27 %   Lymphs Abs 2.8 0.7 - 4.0 K/uL   Monocytes Relative 6 %   Monocytes Absolute 0.6 0.1 - 1.0 K/uL   Eosinophils Relative 1 %   Eosinophils Absolute 0.1 0.0 - 0.5 K/uL   Basophils Relative 1 %   Basophils Absolute 0.1 0.0 - 0.1 K/uL   Immature Granulocytes 0 %   Abs Immature Granulocytes 0.02 0.00 - 0.07 K/uL  Iron  and TIBC   Collection Time: 11/23/23  3:53 PM  Result Value Ref Range   Iron  48 28 - 170 ug/dL   TIBC 557 749 - 549 ug/dL   Saturation Ratios 11 10.4 - 31.8 %   UIBC 394 ug/dL  Ferritin   Collection Time: 11/23/23  3:53 PM  Result Value Ref Range   Ferritin 8 (L) 11 - 307 ng/mL      Assessment & Plan:   Problem List Items Addressed This Visit       Respiratory   Asthma without  status asthmaticus  Will treat with steroids. Recheck 2 weeks. Needs to see immunology- will discuss more when she is feeling better.       Relevant Medications   predniSONE  (DELTASONE ) 10 MG tablet   Other Visit Diagnoses       Pneumonia of right lower lobe due to infectious organism    -  Primary   Flu and covid negative. Will treat with augmentin . Call with any concerns. Continue to monitor.   Relevant Medications   amoxicillin -clavulanate (AUGMENTIN ) 875-125 MG tablet        Follow up plan: Return between Christmas and New Years for lung recheck with Darice, 1 month with me.

## 2024-02-05 ENCOUNTER — Ambulatory Visit: Admitting: Nurse Practitioner

## 2024-02-05 ENCOUNTER — Encounter: Payer: Self-pay | Admitting: Nurse Practitioner

## 2024-02-05 VITALS — BP 140/94 | HR 106 | Temp 98.5°F | Ht 67.0 in | Wt 254.2 lb

## 2024-02-05 DIAGNOSIS — J189 Pneumonia, unspecified organism: Secondary | ICD-10-CM

## 2024-02-05 MED ORDER — HYDROCOD POLI-CHLORPHE POLI ER 10-8 MG/5ML PO SUER: 5.0000 mL | Freq: Every evening | ORAL | 0 refills | Status: DC | PRN

## 2024-02-05 NOTE — Progress Notes (Signed)
 "  BP (!) 140/94   Pulse (!) 106   Temp 98.5 F (36.9 C) (Oral)   Ht 5' 7 (1.702 m)   Wt 254 lb 3.2 oz (115.3 kg)   SpO2 96%   BMI 39.81 kg/m    Subjective:    Patient ID: Mariah Gonzalez, female    DOB: 07/26/1990, 33 y.o.   MRN: [id number]  HPI: Mariah Gonzalez is a 33 y.o. female  Chief Complaint  Patient presents with   Cough   Shortness of Breath   UPPER RESPIRATORY TRACT INFECTION Duration:  Saw PCP over 1 week ago.  Symptoms have been going on for about 10 days.  Patient is still coughing and having SOB, congestion.  Feels like the phlegm is stuck.  She is having trouble sleeping due to the cough.  Not currently taking anything over the counter.    Relevant past medical, surgical, family and social history reviewed and updated as indicated. Interim medical history since our last visit reviewed. Allergies and medications reviewed and updated.  Review of Systems  HENT:  Positive for congestion.   Respiratory:  Positive for cough and shortness of breath.     Per HPI unless specifically indicated above     Objective:    BP (!) 140/94   Pulse (!) 106   Temp 98.5 F (36.9 C) (Oral)   Ht 5' 7 (1.702 m)   Wt 254 lb 3.2 oz (115.3 kg)   SpO2 96%   BMI 39.81 kg/m   Wt Readings from Last 3 Encounters:  02/05/24 254 lb 3.2 oz (115.3 kg)  01/27/24 241 lb 3.2 oz (109.4 kg)  11/23/23 246 lb (111.6 kg)    Physical Exam Vitals and nursing note reviewed.  Constitutional:      General: She is not in acute distress.    Appearance: Normal appearance. She is not ill-appearing, toxic-appearing or diaphoretic.  HENT:     Head: Normocephalic and atraumatic.     Right Ear: Tympanic membrane, ear canal and external ear normal.     Left Ear: Tympanic membrane, ear canal and external ear normal.     Nose: Rhinorrhea present. No congestion.     Mouth/Throat:     Mouth: Mucous membranes are moist.     Pharynx: Oropharynx is clear. No oropharyngeal  exudate or posterior oropharyngeal erythema.  Eyes:     General: No scleral icterus.       Right eye: No discharge.        Left eye: No discharge.     Extraocular Movements: Extraocular movements intact.     Conjunctiva/sclera: Conjunctivae normal.     Pupils: Pupils are equal, round, and reactive to light.  Cardiovascular:     Rate and Rhythm: Normal rate and regular rhythm.     Pulses: Normal pulses.     Heart sounds: Normal heart sounds. No murmur heard.    No friction rub. No gallop.  Pulmonary:     Effort: Pulmonary effort is normal. No respiratory distress.     Breath sounds: No stridor. No wheezing, rhonchi (RLL) or rales.  Chest:     Chest wall: No tenderness.  Musculoskeletal:        General: Normal range of motion.     Cervical back: Normal range of motion and neck supple.  Skin:    General: Skin is warm.     Capillary Refill: Capillary refill takes less than 2 seconds.     Coloration: Skin is not  jaundiced or pale.     Findings: No bruising, erythema, lesion or rash.  Neurological:     General: No focal deficit present.     Mental Status: She is alert and oriented to person, place, and time. Mental status is at baseline.  Psychiatric:        Mood and Affect: Mood normal.        Behavior: Behavior normal.        Thought Content: Thought content normal.        Judgment: Judgment normal.     Results for orders placed or performed in visit on 01/27/24  POC Covid19/Flu A&B Antigen   Collection Time: 01/27/24  1:36 PM  Result Value Ref Range   Influenza A Antigen, POC Negative Negative   Influenza B Antigen, POC Negative Negative   Covid Antigen, POC Negative Negative      Assessment & Plan:   Problem List Items Addressed This Visit   None Visit Diagnoses       Pneumonia of right lower lobe due to infectious organism    -  Primary   Symptoms are improving. No red flags on exam. Recommend Mucinex , Probiotic, conitinue with prednisone . Follow up in 4 days.  Tussinex sent for cough.   Relevant Medications   chlorpheniramine-HYDROcodone  (TUSSIONEX) 10-8 MG/5ML         Follow up plan: Return for December 30 at 1120.      "

## 2024-02-09 ENCOUNTER — Encounter: Payer: Self-pay | Admitting: Nurse Practitioner

## 2024-02-09 ENCOUNTER — Ambulatory Visit: Admitting: Nurse Practitioner

## 2024-02-09 VITALS — BP 151/95 | HR 96 | Temp 97.5°F | Ht 67.0 in | Wt 254.0 lb

## 2024-02-09 DIAGNOSIS — J189 Pneumonia, unspecified organism: Secondary | ICD-10-CM | POA: Diagnosis not present

## 2024-02-09 NOTE — Progress Notes (Signed)
 "  BP (!) 151/95   Pulse 96   Temp (!) 97.5 F (36.4 C) (Oral)   Ht 5' 7 (1.702 m)   Wt 254 lb (115.2 kg)   SpO2 98%   BMI 39.78 kg/m    Subjective:    Patient ID: Mariah Gonzalez, female    DOB: 10-Sep-1990, 33 y.o.   MRN: [id number]  HPI: Mariah Gonzalez is a 33 y.o. female  Chief Complaint  Patient presents with   Pneumonia    Pt states the she is feeling much better   UPPER RESPIRATORY TRACT INFECTION Duration:  Symptoms have improved. No longer having SOB, wheezing.  Cough has also improved.     Relevant past medical, surgical, family and social history reviewed and updated as indicated. Interim medical history since our last visit reviewed. Allergies and medications reviewed and updated.  Review of Systems  HENT:  Negative for congestion.   Respiratory:  Negative for cough, shortness of breath and wheezing.     Per HPI unless specifically indicated above     Objective:    BP (!) 151/95   Pulse 96   Temp (!) 97.5 F (36.4 C) (Oral)   Ht 5' 7 (1.702 m)   Wt 254 lb (115.2 kg)   SpO2 98%   BMI 39.78 kg/m   Wt Readings from Last 3 Encounters:  02/09/24 254 lb (115.2 kg)  02/05/24 254 lb 3.2 oz (115.3 kg)  01/27/24 241 lb 3.2 oz (109.4 kg)    Physical Exam Vitals and nursing note reviewed.  Constitutional:      General: She is not in acute distress.    Appearance: Normal appearance. She is not ill-appearing, toxic-appearing or diaphoretic.  HENT:     Head: Normocephalic and atraumatic.     Right Ear: Tympanic membrane, ear canal and external ear normal.     Left Ear: Tympanic membrane, ear canal and external ear normal.     Nose: No congestion or rhinorrhea.     Mouth/Throat:     Mouth: Mucous membranes are moist.     Pharynx: Oropharynx is clear. No oropharyngeal exudate or posterior oropharyngeal erythema.  Eyes:     General: No scleral icterus.       Right eye: No discharge.        Left eye: No discharge.     Extraocular  Movements: Extraocular movements intact.     Conjunctiva/sclera: Conjunctivae normal.     Pupils: Pupils are equal, round, and reactive to light.  Cardiovascular:     Rate and Rhythm: Normal rate and regular rhythm.     Pulses: Normal pulses.     Heart sounds: Normal heart sounds. No murmur heard.    No friction rub. No gallop.  Pulmonary:     Effort: Pulmonary effort is normal. No respiratory distress.     Breath sounds: No stridor. No wheezing, rhonchi (RLL) or rales.  Chest:     Chest wall: No tenderness.  Musculoskeletal:        General: Normal range of motion.     Cervical back: Normal range of motion and neck supple.  Skin:    General: Skin is warm.     Capillary Refill: Capillary refill takes less than 2 seconds.     Coloration: Skin is not jaundiced or pale.     Findings: No bruising, erythema, lesion or rash.  Neurological:     General: No focal deficit present.     Mental Status: She is  alert and oriented to person, place, and time. Mental status is at baseline.  Psychiatric:        Mood and Affect: Mood normal.        Behavior: Behavior normal.        Thought Content: Thought content normal.        Judgment: Judgment normal.     Results for orders placed or performed in visit on 01/27/24  POC Covid19/Flu A&B Antigen   Collection Time: 01/27/24  1:36 PM  Result Value Ref Range   Influenza A Antigen, POC Negative Negative   Influenza B Antigen, POC Negative Negative   Covid Antigen, POC Negative Negative      Assessment & Plan:   Problem List Items Addressed This Visit   None Visit Diagnoses       Pneumonia of right lower lobe due to infectious organism    -  Primary   Resolved.  Residual symptoms are minimal.  Feels much better.          Follow up plan: No follow-ups on file.      "

## 2024-02-25 ENCOUNTER — Ambulatory Visit: Admitting: Family Medicine

## 2024-02-25 DIAGNOSIS — Z538 Procedure and treatment not carried out for other reasons: Secondary | ICD-10-CM

## 2024-02-25 NOTE — Progress Notes (Signed)
 Patient 3 days early for physical. Will get her back next week for physical

## 2024-02-26 ENCOUNTER — Other Ambulatory Visit: Payer: Self-pay | Admitting: Family Medicine

## 2024-02-26 NOTE — Telephone Encounter (Signed)
 Requested Prescriptions  Pending Prescriptions Disp Refills   naproxen  (NAPROSYN ) 500 MG tablet [Pharmacy Med Name: NAPROXEN  500MG  TABLETS] 180 tablet 1    Sig: TAKE 1 TABLET(500 MG) BY MOUTH TWICE DAILY WITH A MEAL     Analgesics:  NSAIDS Failed - 02/26/2024  1:03 PM      Failed - Manual Review: Labs are only required if the patient has taken medication for more than 8 weeks.      Passed - Cr in normal range and within 360 days    Creatinine  Date Value Ref Range Status  08/26/2022 68.6 20.0 - 300.0 mg/dL Final   Creatinine, Ser  Date Value Ref Range Status  09/21/2023 0.79 0.57 - 1.00 mg/dL Final         Passed - HGB in normal range and within 360 days    Hemoglobin  Date Value Ref Range Status  11/23/2023 12.5 12.0 - 15.0 g/dL Final  91/88/7974 88.8 11.1 - 15.9 g/dL Final         Passed - PLT in normal range and within 360 days    Platelets  Date Value Ref Range Status  11/23/2023 379 150 - 400 K/uL Final  09/21/2023 463 (H) 150 - 450 x10E3/uL Final         Passed - HCT in normal range and within 360 days    HCT  Date Value Ref Range Status  11/23/2023 39.7 36.0 - 46.0 % Final   Hematocrit  Date Value Ref Range Status  09/21/2023 36.0 34.0 - 46.6 % Final         Passed - eGFR is 30 or above and within 360 days    GFR calc Af Amer  Date Value Ref Range Status  03/26/2020 139 >59 mL/min/1.73 Final    Comment:    **In accordance with recommendations from the NKF-ASN Task force,**   [facility] is in the process of updating its eGFR calculation to the   2021 CKD-EPI creatinine equation that estimates kidney function   without a race variable.    GFR, Estimated  Date Value Ref Range Status  11/06/2021 >60 >60 mL/min Final    Comment:    (NOTE) Calculated using the CKD-EPI Creatinine Equation (2021)    eGFR  Date Value Ref Range Status  09/21/2023 102 >59 mL/min/1.73 Final         Passed - Patient is not pregnant      Passed - Valid encounter within last  12 months    Recent Outpatient Visits           Yesterday Appointment canceled by hospital   [facility] [place], [place] P, DO   2 weeks ago Pneumonia of right lower lobe due to infectious organism   [facility] [place] Pao, NP   3 weeks ago Pneumonia of right lower lobe due to infectious organism   [place] [facility] Melvin Pao, NP   1 month ago Pneumonia of right lower lobe due to infectious organism   [place] [facility] Melvin, Megan P, DO   5 months ago History of anemia   [facility] [place], [place], DO

## 2024-03-02 ENCOUNTER — Ambulatory Visit: Admitting: Family Medicine

## 2024-03-02 ENCOUNTER — Other Ambulatory Visit (HOSPITAL_COMMUNITY)
Admission: RE | Admit: 2024-03-02 | Discharge: 2024-03-02 | Disposition: A | Source: Ambulatory Visit | Attending: Family Medicine | Admitting: Family Medicine

## 2024-03-02 VITALS — HR 98 | Ht 67.0 in | Wt 255.6 lb

## 2024-03-02 DIAGNOSIS — D509 Iron deficiency anemia, unspecified: Secondary | ICD-10-CM | POA: Diagnosis not present

## 2024-03-02 DIAGNOSIS — Z0000 Encounter for general adult medical examination without abnormal findings: Secondary | ICD-10-CM | POA: Diagnosis present

## 2024-03-02 DIAGNOSIS — J4551 Severe persistent asthma with (acute) exacerbation: Secondary | ICD-10-CM | POA: Diagnosis not present

## 2024-03-02 DIAGNOSIS — F3131 Bipolar disorder, current episode depressed, mild: Secondary | ICD-10-CM

## 2024-03-02 DIAGNOSIS — E781 Pure hyperglyceridemia: Secondary | ICD-10-CM

## 2024-03-02 DIAGNOSIS — Z23 Encounter for immunization: Secondary | ICD-10-CM | POA: Diagnosis not present

## 2024-03-02 DIAGNOSIS — Z803 Family history of malignant neoplasm of breast: Secondary | ICD-10-CM | POA: Diagnosis not present

## 2024-03-02 LAB — BAYER DCA HB A1C WAIVED: HB A1C (BAYER DCA - WAIVED): 5.2 % (ref 4.8–5.6)

## 2024-03-02 MED ORDER — BUDESONIDE-FORMOTEROL FUMARATE 160-4.5 MCG/ACT IN AERO: 2.0000 | INHALATION_SPRAY | Freq: Two times a day (BID) | RESPIRATORY_TRACT | 3 refills | Status: AC

## 2024-03-02 NOTE — Patient Instructions (Signed)
 Please call to schedule your mammogram and/or bone density: [facility] at [facility]  Address: [address], [place], [place] [address] Phone: [phone]  [facility] [address] [place],  [place]  [address] Phone: [phone]

## 2024-03-02 NOTE — Progress Notes (Signed)
 "  Pulse 98   Ht 5' 7 (1.702 m)   Wt 255 lb 9.6 oz (115.9 kg)   LMP 02/10/2024 (Within Days)   SpO2 98%   BMI 40.03 kg/m    Subjective:    Patient ID: Mariah Gonzalez, female    DOB: [birth year], 34 y.o.   MRN: [id number]  HPI: Mariah Gonzalez is a 34 y.o. female presenting on 03/02/2024 for comprehensive medical examination. Current medical complaints include:  HYPERLIPIDEMIA Hyperlipidemia status: excellent compliance Satisfied with current treatment?  yes Side effects:  no Medication compliance: excellent compliance Past cholesterol meds: fenofibrate  Supplements: none Aspirin:  no The ASCVD Risk score (Arnett DK, et al., 2019) failed to calculate for the following reasons:   The 2019 ASCVD risk score is only valid for ages 17 to 7 Chest pain:  no Coronary artery disease:  no Family history CAD:  yes Family history early CAD:  yes  Breathing is doing better after her illness. Mood has been doing OK working with her psychiatrist. Things are stable. She is not having any concerns.    She currently lives with: wife Menopausal Symptoms: no  Depression Screen done today and results listed below:     03/02/2024   10:48 AM 01/27/2024    9:48 AM 11/30/2023    2:30 PM 08/19/2023    1:55 PM 03/13/2023    3:05 PM  Depression screen PHQ 2/9  Decreased Interest  1 0 2 1  Down, Depressed, Hopeless 2 1 0 2 1  PHQ - 2 Score 2 2 0 4 2  Altered sleeping 3 1  2 1   Tired, decreased energy 2 1  2 1   Change in appetite 1 1  2 1   Feeling bad or failure about yourself  0 1  2 1   Trouble concentrating 2 1  2 1   Moving slowly or fidgety/restless 2 1  2 1   Suicidal thoughts 0 0  0 1  PHQ-9 Score 12 8  16  9    Difficult doing work/chores Somewhat difficult Somewhat difficult  Very difficult Somewhat difficult     Data saved with a previous flowsheet row definition    Past Medical History:  Past Medical History:  Diagnosis Date   ADHD    Allergy    Anxiety     Arthritis    Asthma    Depressed    DUB (dysfunctional uterine bleeding)    Dysfunctional uterine bleeding    GERD (gastroesophageal reflux disease)    High cholesterol    History of COVID-19 01/24/2021   Hypertension    IBS (irritable bowel syndrome)    IDA (iron  deficiency anemia)    LGSIL on Pap smear of cervix    Migraine without aura and without status migrainosus, not intractable    Pelvic pain in female     Surgical History:  Past Surgical History:  Procedure Laterality Date   debridement of rught eye     ESOPHAGOGASTRODUODENOSCOPY (EGD) WITH PROPOFOL  N/A 08/05/2021   Procedure: ESOPHAGOGASTRODUODENOSCOPY (EGD) WITH PROPOFOL ;  Surgeon: Unk Corinn Skiff, MD;  Location: [facility];  Service: Gastroenterology;  Laterality: N/A;   gum grafting     TONSILLECTOMY Bilateral 07/03/2020   Procedure: TONSILLECTOMY;  Surgeon: Blair Mt, MD;  Location: [facility];  Service: ENT;  Laterality: Bilateral;   WISDOM TOOTH EXTRACTION      Medications:  Current Outpatient Medications on File Prior to Visit  Medication Sig   albuterol  (PROVENTIL ) (2.5 MG/3ML) 0.083% nebulizer  solution Take 3 mLs (2.5 mg total) by nebulization every 6 (six) hours as needed for wheezing or shortness of breath.   albuterol  (VENTOLIN  HFA) 108 (90 Base) MCG/ACT inhaler Inhale 2 puffs into the lungs every 6 (six) hours as needed for wheezing or shortness of breath.   amphetamine-dextroamphetamine (ADDERALL XR) 10 MG 24 hr capsule Take 10 mg by mouth daily.   amphetamine-dextroamphetamine (ADDERALL) 5 MG tablet Take 1 tablet by mouth daily. Patient is taking 0.5 tablet   ARIPiprazole (ABILIFY) 5 MG tablet Take 10 mg by mouth daily.   baclofen  (LIORESAL ) 10 MG tablet TAKE 1/2 TO 1 TABLET(5 TO 10 MG) BY MOUTH THREE TIMES DAILY (Patient taking differently: As needed)   buPROPion ER (WELLBUTRIN SR) 100 MG 12 hr tablet Take 100 mg by mouth daily.   cyclobenzaprine  (FLEXERIL ) 10 MG tablet Take 1 tablet  (10 mg total) by mouth at bedtime. (Patient taking differently: Take 10 mg by mouth at bedtime. As needed)   fenofibrate  (TRICOR ) 145 MG tablet Take 1 tablet (145 mg total) by mouth daily.   Fluticasone-Umeclidin-Vilant (TRELEGY ELLIPTA ) 100-62.5-25 MCG/ACT AEPB Inhale 1 puff into the lungs daily.   hydrOXYzine  (ATARAX ) 10 MG tablet Take 0.5-1 tablets (5-10 mg total) by mouth 3 (three) times daily as needed for itching.   Lidocaine , Anorectal, 5 % GEL Apply 1 Application topically 2 (two) times daily as needed.   MELATONIN PO Take 1 each by mouth daily.   metoprolol  succinate (TOPROL -XL) 25 MG 24 hr tablet TAKE 1 TABLET (25 MG) BY MOUTH DAILY WITH SUPPER   montelukast  (SINGULAIR ) 10 MG tablet TAKE 1 TABLET(10 MG) BY MOUTH AT BEDTIME   naproxen  (NAPROSYN ) 500 MG tablet TAKE 1 TABLET(500 MG) BY MOUTH TWICE DAILY WITH A MEAL   omeprazole  (PRILOSEC) 40 MG capsule TAKE 1 CAPSULE(40 MG) BY MOUTH DAILY   ondansetron  (ZOFRAN ) 4 MG tablet Take 1 tablet (4 mg total) by mouth every 8 (eight) hours as needed for nausea or vomiting.   sucralfate  (CARAFATE ) 1 g tablet TAKE 1 TABLET(1 GRAM) BY MOUTH FOUR TIMES DAILY AT BEDTIME WITH MEALS   traZODone  (DESYREL ) 150 MG tablet Take 150 mg by mouth at bedtime.   lamoTRIgine  (LAMICTAL ) 150 MG tablet Take 1 tablet (150 mg total) by mouth every morning.   lamoTRIgine  (LAMICTAL ) 200 MG tablet    LORazepam  (ATIVAN ) 0.5 MG tablet Take 1 tablet (0.5 mg total) by mouth as directed. Take 1 tablet 1-2 times a day as needed for severe anxiety attacks only - please limit use   No current facility-administered medications on file prior to visit.    Allergies:  Allergies[1]  Social History:  Social History   Socioeconomic History   Marital status: Married    Spouse name: Not on file   Number of children: Not on file   Years of education: Not on file   Highest education level: Bachelor's degree (e.g., BA, AB, BS)  Occupational History   Occupation: Runner, Broadcasting/film/video - for the  Deaf    Comment: Sign Language -  Tobacco Use   Smoking status: Former    Current packs/day: 0.00    Average packs/day: 0.5 packs/day for 2.0 years (1.0 ttl pk-yrs)    Types: Cigarettes    Quit date: 10/08/2022    Years since quitting: 1.4    Passive exposure: Never   Smokeless tobacco: Never  Vaping Use   Vaping status: Never Used  Substance and Sexual Activity   Alcohol use: Yes    Comment:  Rarely drink   Drug use: Yes    Types: Marijuana    Comment: Delta 8- chewed a gummy on 02/13/2022   Sexual activity: Yes    Birth control/protection: None  Other Topics Concern   Not on file  Social History Narrative   Not on file   Social Drivers of Health   Tobacco Use: Medium Risk (03/02/2024)   Patient History    Smoking Tobacco Use: Former    Smokeless Tobacco Use: Never    Passive Exposure: Never  Physicist, Medical Strain: High Risk (03/02/2024)   Overall Financial Resource Strain (CARDIA)    Difficulty of Paying Living Expenses: Hard  Food Insecurity: Food Insecurity Present (03/02/2024)   Epic    Worried About Programme Researcher, Broadcasting/film/video in the Last Year: Sometimes true    Ran Out of Food in the Last Year: Sometimes true  Transportation Needs: No Transportation Needs (03/02/2024)   Epic    Lack of Transportation (Medical): No    Lack of Transportation (Non-Medical): No  Physical Activity: Insufficiently Active (03/02/2024)   Exercise Vital Sign    Days of Exercise per Week: 5 days    Minutes of Exercise per Session: 20 min  Stress: Stress Concern Present (03/02/2024)   [employer] of Occupational Health - Occupational Stress Questionnaire    Feeling of Stress: Rather much  Social Connections: Moderately Integrated (03/02/2024)   Social Connection and Isolation Panel    Frequency of Communication with Friends and Family: More than three times a week    Frequency of Social Gatherings with Friends and Family: More than three times a week    Attends Religious Services: Never     Database Administrator or Organizations: Yes    Attends Engineer, Structural: More than 4 times per year    Marital Status: Married  Catering Manager Violence: Not At Risk (03/02/2024)   Epic    Fear of Current or Ex-Partner: No    Emotionally Abused: No    Physically Abused: No    Sexually Abused: No  Depression (PHQ2-9): High Risk (03/02/2024)   Depression (PHQ2-9)    PHQ-2 Score: 12  Alcohol Screen: Low Risk (03/02/2024)   Alcohol Screen    Last Alcohol Screening Score (AUDIT): 1  Housing: Low Risk (03/02/2024)   Epic    Unable to Pay for Housing in the Last Year: No    Number of Times Moved in the Last Year: 0    Homeless in the Last Year: No  Utilities: Not At Risk (03/02/2024)   Epic    Threatened with loss of utilities: No  Health Literacy: Adequate Health Literacy (03/02/2024)   B1300 Health Literacy    Frequency of need for help with medical instructions: Never   Tobacco Use History[2] Social History   Substance and Sexual Activity  Alcohol Use Yes   Comment: Rarely drink    Family History:  Family History  Problem Relation Age of Onset   Hyperlipidemia Mother    Hypertension Mother    Depression Mother    Diabetes Father    Hyperlipidemia Father    Hypertension Father    Mental illness Father    Heart attack Father 46       Had 2 heart attacks   Stroke Father    Coronary artery disease Father    Diabetes Mellitus II Father    Melanoma Father    Hypertension Brother    Diabetes Paternal Grandfather    Heart attack  Paternal Grandfather 66   Breast cancer Maternal Aunt    Breast cancer Maternal Aunt    Cancer Maternal Uncle    Breast cancer Paternal Aunt    Hearing loss Paternal Aunt    Cancer Paternal Uncle    Hearing loss Paternal Uncle    Diabetes Paternal Uncle    Heart attack Half-Brother 62   Coronary artery disease Half-Brother 51       CABG x4   Stroke Half-Brother 57    Past medical history, surgical history, medications,  allergies, family history and social history reviewed with patient today and changes made to appropriate areas of the chart.   Review of Systems  Constitutional:  Positive for diaphoresis. Negative for chills, fever, malaise/fatigue and weight loss.  HENT: Negative.    Eyes:  Positive for blurred vision (R eye). Negative for double vision, photophobia, pain, discharge and redness.  Respiratory: Negative.    Cardiovascular:  Positive for leg swelling (when she was sick- resolved now). Negative for chest pain, palpitations, orthopnea, claudication and PND.  Gastrointestinal:  Positive for constipation, diarrhea and vomiting (in the AM). Negative for abdominal pain, blood in stool, heartburn, melena and nausea.  Genitourinary:  Positive for frequency. Negative for dysuria, flank pain, hematuria and urgency.  Musculoskeletal:  Positive for back pain and joint pain. Negative for falls, myalgias and neck pain.  Skin: Negative.   Neurological: Negative.   Endo/Heme/Allergies:  Positive for environmental allergies. Negative for polydipsia. Does not bruise/bleed easily.  Psychiatric/Behavioral:  Positive for depression. Negative for hallucinations, memory loss, substance abuse and suicidal ideas. The patient is nervous/anxious. The patient does not have insomnia.    All other ROS negative except what is listed above and in the HPI.      Objective:    Pulse 98   Ht 5' 7 (1.702 m)   Wt 255 lb 9.6 oz (115.9 kg)   LMP 02/10/2024 (Within Days)   SpO2 98%   BMI 40.03 kg/m   Wt Readings from Last 3 Encounters:  03/02/24 255 lb 9.6 oz (115.9 kg)  02/09/24 254 lb (115.2 kg)  02/05/24 254 lb 3.2 oz (115.3 kg)    Physical Exam Vitals and nursing note reviewed. Exam conducted with a chaperone present.  Constitutional:      General: She is not in acute distress.    Appearance: Normal appearance. She is not ill-appearing, toxic-appearing or diaphoretic.  HENT:     Head: Normocephalic and atraumatic.      Right Ear: Tympanic membrane, ear canal and external ear normal. There is no impacted cerumen.     Left Ear: Tympanic membrane, ear canal and external ear normal. There is no impacted cerumen.     Nose: Nose normal. No congestion or rhinorrhea.     Mouth/Throat:     Mouth: Mucous membranes are moist.     Pharynx: Oropharynx is clear. No oropharyngeal exudate or posterior oropharyngeal erythema.  Eyes:     General: No scleral icterus.       Right eye: No discharge.        Left eye: No discharge.     Extraocular Movements: Extraocular movements intact.     Conjunctiva/sclera: Conjunctivae normal.     Pupils: Pupils are equal, round, and reactive to light.  Neck:     Vascular: No carotid bruit.  Cardiovascular:     Rate and Rhythm: Normal rate and regular rhythm.     Pulses: Normal pulses.     Heart sounds: No  murmur heard.    No friction rub. No gallop.  Pulmonary:     Effort: Pulmonary effort is normal. No respiratory distress.     Breath sounds: Normal breath sounds. No stridor. No wheezing, rhonchi or rales.  Chest:     Chest wall: No tenderness.  Abdominal:     General: Abdomen is flat. Bowel sounds are normal. There is no distension.     Palpations: Abdomen is soft. There is no mass.     Tenderness: There is no abdominal tenderness. There is no right CVA tenderness, left CVA tenderness, guarding or rebound.     Hernia: No hernia is present.  Genitourinary:    Labia:        Right: No rash, tenderness, lesion or injury.        Left: No rash, tenderness, lesion or injury.      Vagina: Normal.     Cervix: Normal.     Uterus: Normal.      Comments: Breast exams deferred with shared decision making Musculoskeletal:        General: No swelling, tenderness, deformity or signs of injury.     Cervical back: Normal range of motion and neck supple. No rigidity. No muscular tenderness.     Right lower leg: No edema.     Left lower leg: No edema.  Lymphadenopathy:     Cervical:  No cervical adenopathy.  Skin:    General: Skin is warm and dry.     Capillary Refill: Capillary refill takes less than 2 seconds.     Coloration: Skin is not jaundiced or pale.     Findings: No bruising, erythema, lesion or rash.  Neurological:     General: No focal deficit present.     Mental Status: She is alert and oriented to person, place, and time. Mental status is at baseline.     Cranial Nerves: No cranial nerve deficit.     Sensory: No sensory deficit.     Motor: No weakness.     Coordination: Coordination normal.     Gait: Gait normal.     Deep Tendon Reflexes: Reflexes normal.  Psychiatric:        Mood and Affect: Mood normal.        Behavior: Behavior normal.        Thought Content: Thought content normal.        Judgment: Judgment normal.     Results for orders placed or performed in visit on 03/02/24  Bayer DCA Hb A1c Waived   Collection Time: 03/02/24 11:47 AM  Result Value Ref Range   HB A1C (BAYER DCA - WAIVED) 5.2 4.8 - 5.6 %  CBC with Differential/Platelet   Collection Time: 03/02/24 11:48 AM  Result Value Ref Range   WBC 9.7 3.4 - 10.8 x10E3/uL   RBC 4.98 3.77 - 5.28 x10E6/uL   Hemoglobin 13.3 11.1 - 15.9 g/dL   Hematocrit 57.8 65.9 - 46.6 %   MCV 85 79 - 97 fL   MCH 26.7 26.6 - 33.0 pg   MCHC 31.6 31.5 - 35.7 g/dL   RDW 85.9 88.2 - 84.5 %   Platelets 485 (H) 150 - 450 x10E3/uL   Neutrophils 67 Not Estab. %   Lymphs 27 Not Estab. %   Monocytes 5 Not Estab. %   Eos 0 Not Estab. %   Basos 1 Not Estab. %   Neutrophils Absolute 6.5 1.4 - 7.0 x10E3/uL   Lymphocytes Absolute 2.6 0.7 - 3.1 x10E3/uL  Monocytes Absolute 0.5 0.1 - 0.9 x10E3/uL   EOS (ABSOLUTE) 0.0 0.0 - 0.4 x10E3/uL   Basophils Absolute 0.1 0.0 - 0.2 x10E3/uL   Immature Granulocytes 0 Not Estab. %   Immature Grans (Abs) 0.0 0.0 - 0.1 x10E3/uL  Comprehensive metabolic panel with GFR   Collection Time: 03/02/24 11:48 AM  Result Value Ref Range   Glucose 98 70 - 99 mg/dL   BUN 13 6 -  20 mg/dL   Creatinine, Ser 9.13 0.57 - 1.00 mg/dL   eGFR 93 >40 fO/fpw/8.26   BUN/Creatinine Ratio 15 9 - 23   Sodium 135 134 - 144 mmol/L   Potassium 4.2 3.5 - 5.2 mmol/L   Chloride 100 96 - 106 mmol/L   CO2 20 20 - 29 mmol/L   Calcium 9.5 8.7 - 10.2 mg/dL   Total Protein 7.3 6.0 - 8.5 g/dL   Albumin 4.6 3.9 - 4.9 g/dL   Globulin, Total 2.7 1.5 - 4.5 g/dL   Bilirubin Total <9.7 0.0 - 1.2 mg/dL   Alkaline Phosphatase 66 41 - 116 IU/L   AST 17 0 - 40 IU/L   ALT 15 0 - 32 IU/L  Lipid Panel w/o Chol/HDL Ratio   Collection Time: 03/02/24 11:48 AM  Result Value Ref Range   Cholesterol, Total 205 (H) 100 - 199 mg/dL   Triglycerides 97 0 - 149 mg/dL   HDL 57 >60 mg/dL   VLDL Cholesterol Cal 17 5 - 40 mg/dL   LDL Chol Calc (NIH) 868 (H) 0 - 99 mg/dL  TSH   Collection Time: 03/02/24 11:48 AM  Result Value Ref Range   TSH 1.540 0.450 - 4.500 uIU/mL  Ferritin   Collection Time: 03/02/24 11:48 AM  Result Value Ref Range   Ferritin 19 15 - 150 ng/mL  Iron  Binding Cap (TIBC)([facility])   Collection Time: 03/02/24 11:48 AM  Result Value Ref Range   Total Iron  Binding Capacity 405 250 - 450 ug/dL   UIBC 636 868 - 574 ug/dL   Iron  42 27 - 159 ug/dL   Iron  Saturation 10 (L) 15 - 55 %      Assessment & Plan:   Problem List Items Addressed This Visit       Respiratory   Asthma without status asthmaticus   Lungs clear today. Will get her into see asthma/allergy given repeated exacerbations. Continue symbicort . Call with any concerns.       Relevant Medications   budesonide -formoterol  (SYMBICORT ) 160-4.5 MCG/ACT inhaler   Other Relevant Orders   Ambulatory referral to Allergy     Other   Hypertriglyceridemia (Chronic)   Under good control on current regimen. Continue current regimen. Continue to monitor. Call with any concerns. Refills given. Labs drawn today.       Relevant Orders   Comprehensive metabolic panel with GFR (Completed)   Lipid Panel w/o Chol/HDL Ratio  (Completed)   Bipolar 1 disorder, depressed, mild (HCC)   Continue to follow with psychiatry. Stable. Call with any concerns.       Relevant Medications   LORazepam  (ATIVAN ) 0.5 MG tablet   lamoTRIgine  (LAMICTAL ) 200 MG tablet   Iron  deficiency anemia   Rechecking labs today. Await results. Treat as needed.       Relevant Orders   Ferritin (Completed)   Iron  Binding Cap (TIBC)([facility]) (Completed)   Other Visit Diagnoses       Routine general medical examination at a health care facility    -  Primary   Vaccines  up to date. Screening labs checked today. Pap done. Mammogram ordered. Continue diet and exercise. Call with any concerns.   Relevant Orders   CBC with Differential/Platelet (Completed)   Comprehensive metabolic panel with GFR (Completed)   Lipid Panel w/o Chol/HDL Ratio (Completed)   TSH (Completed)   Bayer DCA Hb A1c Waived (Completed)   Cytology - PAP   Ferritin (Completed)   Iron  Binding Cap (TIBC)([facility]) (Completed)     Family history of breast cancer       Mammogram ordered today.   Relevant Orders   MM 3D SCREENING MAMMOGRAM BILATERAL BREAST     Needs flu shot       Flu shot given today.   Relevant Orders   Flu vaccine trivalent PF, 6mos and older(Flulaval,Afluria,Fluarix,Fluzone) (Completed)        Follow up plan: No follow-ups on file.   LABORATORY TESTING:  - Pap smear: pap done  IMMUNIZATIONS:   - Tdap: Tetanus vaccination status reviewed: last tetanus booster within 10 years. - Influenza: Administered today - Pneumovax: Up to date - Prevnar: Not applicable - COVID: Refused - HPV: Up to date  SCREENING: -Mammogram: Ordered today   PATIENT COUNSELING:   Advised to take 1 mg of folate supplement per day if capable of pregnancy.   Sexuality: Discussed sexually transmitted diseases, partner selection, use of condoms, avoidance of unintended pregnancy  and contraceptive alternatives.   Advised to avoid cigarette  smoking.  I discussed with the patient that most people either abstain from alcohol or drink within safe limits (<=14/week and <=4 drinks/occasion for males, <=7/weeks and <= 3 drinks/occasion for females) and that the risk for alcohol disorders and other health effects rises proportionally with the number of drinks per week and how often a drinker exceeds daily limits.  Discussed cessation/primary prevention of drug use and availability of treatment for abuse.   Diet: Encouraged to adjust caloric intake to maintain  or achieve ideal body weight, to reduce intake of dietary saturated fat and total fat, to limit sodium intake by avoiding high sodium foods and not adding table salt, and to maintain adequate dietary potassium and calcium preferably from fresh fruits, vegetables, and low-fat dairy products.    stressed the importance of regular exercise  Injury prevention: Discussed safety belts, safety helmets, smoke detector, smoking near bedding or upholstery.   Dental health: Discussed importance of regular tooth brushing, flossing, and dental visits.    NEXT PREVENTATIVE PHYSICAL DUE IN 1 YEAR. No follow-ups on file.               [1]  Allergies Allergen Reactions   Egg Protein-Containing Drug Products Diarrhea  [2]  Social History Tobacco Use  Smoking Status Former   Current packs/day: 0.00   Average packs/day: 0.5 packs/day for 2.0 years (1.0 ttl pk-yrs)   Types: Cigarettes   Quit date: 10/08/2022   Years since quitting: 1.4   Passive exposure: Never  Smokeless Tobacco Never   "

## 2024-03-03 LAB — CBC WITH DIFFERENTIAL/PLATELET
Basophils Absolute: 0.1 x10E3/uL (ref 0.0–0.2)
Basos: 1 %
EOS (ABSOLUTE): 0 x10E3/uL (ref 0.0–0.4)
Eos: 0 %
Hematocrit: 42.1 % (ref 34.0–46.6)
Hemoglobin: 13.3 g/dL (ref 11.1–15.9)
Immature Grans (Abs): 0 x10E3/uL (ref 0.0–0.1)
Immature Granulocytes: 0 %
Lymphocytes Absolute: 2.6 x10E3/uL (ref 0.7–3.1)
Lymphs: 27 %
MCH: 26.7 pg (ref 26.6–33.0)
MCHC: 31.6 g/dL (ref 31.5–35.7)
MCV: 85 fL (ref 79–97)
Monocytes Absolute: 0.5 x10E3/uL (ref 0.1–0.9)
Monocytes: 5 %
Neutrophils Absolute: 6.5 x10E3/uL (ref 1.4–7.0)
Neutrophils: 67 %
Platelets: 485 x10E3/uL — ABNORMAL HIGH (ref 150–450)
RBC: 4.98 x10E6/uL (ref 3.77–5.28)
RDW: 14 % (ref 11.7–15.4)
WBC: 9.7 x10E3/uL (ref 3.4–10.8)

## 2024-03-03 LAB — FERRITIN: Ferritin: 19 ng/mL (ref 15–150)

## 2024-03-03 LAB — COMPREHENSIVE METABOLIC PANEL WITH GFR
ALT: 15 IU/L (ref 0–32)
AST: 17 IU/L (ref 0–40)
Albumin: 4.6 g/dL (ref 3.9–4.9)
Alkaline Phosphatase: 66 IU/L (ref 41–116)
BUN/Creatinine Ratio: 15 (ref 9–23)
BUN: 13 mg/dL (ref 6–20)
Bilirubin Total: <0.2 mg/dL (ref 0.0–1.2)
CO2: 20 mmol/L (ref 20–29)
Calcium: 9.5 mg/dL (ref 8.7–10.2)
Chloride: 100 mmol/L (ref 96–106)
Creatinine, Ser: 0.86 mg/dL (ref 0.57–1.00)
Globulin, Total: 2.7 g/dL (ref 1.5–4.5)
Glucose: 98 mg/dL (ref 70–99)
Potassium: 4.2 mmol/L (ref 3.5–5.2)
Sodium: 135 mmol/L (ref 134–144)
Total Protein: 7.3 g/dL (ref 6.0–8.5)
eGFR: 93 mL/min/1.73

## 2024-03-03 LAB — IRON AND TIBC
Iron Saturation: 10 % — ABNORMAL LOW (ref 15–55)
Iron: 42 ug/dL (ref 27–159)
Total Iron Binding Capacity: 405 ug/dL (ref 250–450)
UIBC: 363 ug/dL (ref 131–425)

## 2024-03-03 LAB — TSH: TSH: 1.54 u[IU]/mL (ref 0.450–4.500)

## 2024-03-03 LAB — LIPID PANEL W/O CHOL/HDL RATIO
Cholesterol, Total: 205 mg/dL — ABNORMAL HIGH (ref 100–199)
HDL: 57 mg/dL
LDL Chol Calc (NIH): 131 mg/dL — ABNORMAL HIGH (ref 0–99)
Triglycerides: 97 mg/dL (ref 0–149)
VLDL Cholesterol Cal: 17 mg/dL (ref 5–40)

## 2024-03-05 ENCOUNTER — Ambulatory Visit: Payer: Self-pay | Admitting: Family Medicine

## 2024-03-05 NOTE — Assessment & Plan Note (Signed)
 Lungs clear today. Will get her into see asthma/allergy given repeated exacerbations. Continue symbicort . Call with any concerns.

## 2024-03-05 NOTE — Assessment & Plan Note (Signed)
 Rechecking labs today. Await results. Treat as needed.

## 2024-03-05 NOTE — Assessment & Plan Note (Signed)
Continue to follow with psychiatry. Stable. Call with any concerns.  

## 2024-03-05 NOTE — Assessment & Plan Note (Signed)
 Under good control on current regimen. Continue current regimen. Continue to monitor. Call with any concerns. Refills given. Labs drawn today.

## 2024-03-08 LAB — CYTOLOGY - PAP
Adequacy: ABSENT
Comment: NEGATIVE
Diagnosis: NEGATIVE
High risk HPV: NEGATIVE

## 2024-03-17 ENCOUNTER — Other Ambulatory Visit: Payer: Self-pay | Admitting: Family Medicine

## 2024-03-18 NOTE — Telephone Encounter (Signed)
 Requested Prescriptions  Pending Prescriptions Disp Refills   fenofibrate  (TRICOR ) 145 MG tablet [Pharmacy Med Name: FENOFIBRATE  145MG  TABLETS] 90 tablet 3    Sig: TAKE 1 TABLET(145 MG) BY MOUTH DAILY     Cardiovascular:  Antilipid - Fibric Acid Derivatives Failed - 03/18/2024  1:53 PM      Failed - PLT in normal range and within 360 days    Platelets  Date Value Ref Range Status  03/02/2024 485 (H) 150 - 450 x10E3/uL Final         Failed - Lipid Panel in normal range within the last 12 months    Cholesterol, Total  Date Value Ref Range Status  03/02/2024 205 (H) 100 - 199 mg/dL Final   LDL Chol Calc (NIH)  Date Value Ref Range Status  03/02/2024 131 (H) 0 - 99 mg/dL Final   HDL  Date Value Ref Range Status  03/02/2024 57 >39 mg/dL Final   Triglycerides  Date Value Ref Range Status  03/02/2024 97 0 - 149 mg/dL Final         Passed - ALT in normal range and within 360 days    ALT  Date Value Ref Range Status  03/02/2024 15 0 - 32 IU/L Final         Passed - AST in normal range and within 360 days    AST  Date Value Ref Range Status  03/02/2024 17 0 - 40 IU/L Final         Passed - Cr in normal range and within 360 days    Creatinine  Date Value Ref Range Status  08/26/2022 68.6 20.0 - 300.0 mg/dL Final   Creatinine, Ser  Date Value Ref Range Status  03/02/2024 0.86 0.57 - 1.00 mg/dL Final         Passed - HGB in normal range and within 360 days    Hemoglobin  Date Value Ref Range Status  03/02/2024 13.3 11.1 - 15.9 g/dL Final         Passed - HCT in normal range and within 360 days    Hematocrit  Date Value Ref Range Status  03/02/2024 42.1 34.0 - 46.6 % Final         Passed - WBC in normal range and within 360 days    WBC  Date Value Ref Range Status  03/02/2024 9.7 3.4 - 10.8 x10E3/uL Final  11/23/2023 10.4 4.0 - 10.5 K/uL Final         Passed - eGFR is 30 or above and within 360 days    GFR calc Af Amer  Date Value Ref Range Status   03/26/2020 139 >59 mL/min/1.73 Final    Comment:    **In accordance with recommendations from the NKF-ASN Task force,**   [facility] is in the process of updating its eGFR calculation to the   2021 CKD-EPI creatinine equation that estimates kidney function   without a race variable.    GFR, Estimated  Date Value Ref Range Status  11/06/2021 >60 >60 mL/min Final    Comment:    (NOTE) Calculated using the CKD-EPI Creatinine Equation (2021)    eGFR  Date Value Ref Range Status  03/02/2024 93 >59 mL/min/1.73 Final         Passed - Valid encounter within last 12 months    Recent Outpatient Visits           2 weeks ago Routine general medical examination at a health care facility   [facility] [place], [place] P, DO   3 weeks ago Appointment canceled by hospital   [place] [facility] [place], [place] P, DO   1 month ago Pneumonia of right lower lobe due to infectious organism   [place] [facility] Melvin Pao, NP   1 month ago Pneumonia of right lower lobe due to infectious organism   [facility] Melvin Pao, NP   1 month ago Pneumonia of right lower lobe due to infectious organism   [place] [facility] [place], [place] P, DO

## 2024-03-24 ENCOUNTER — Encounter: Admitting: Family Medicine

## 2024-05-23 ENCOUNTER — Inpatient Hospital Stay

## 2024-05-30 ENCOUNTER — Inpatient Hospital Stay

## 2024-05-30 ENCOUNTER — Inpatient Hospital Stay: Admitting: Oncology
